# Patient Record
Sex: Male | Born: 1945
Health system: Southern US, Community
[De-identification: ages and names within clinical notes are randomized; demographics above are authoritative.]

## PROBLEM LIST (undated history)

## (undated) DIAGNOSIS — D649 Anemia, unspecified: Secondary | ICD-10-CM

## (undated) DIAGNOSIS — R569 Unspecified convulsions: Secondary | ICD-10-CM

## (undated) DIAGNOSIS — E785 Hyperlipidemia, unspecified: Secondary | ICD-10-CM

## (undated) HISTORY — DX: Anemia, unspecified: D64.9

## (undated) HISTORY — DX: Unspecified convulsions: R56.9

## (undated) HISTORY — DX: Hyperlipidemia, unspecified: E78.5

## (undated) HISTORY — PX: EYE SURGERY: SHX253

---

## 2001-05-29 ENCOUNTER — Ambulatory Visit (HOSPITAL_COMMUNITY): Admission: RE | Admit: 2001-05-29 | Discharge: 2001-05-29 | Payer: Self-pay | Admitting: Gastroenterology

## 2006-06-14 ENCOUNTER — Emergency Department (HOSPITAL_COMMUNITY): Admission: EM | Admit: 2006-06-14 | Discharge: 2006-06-14 | Payer: Self-pay | Admitting: Emergency Medicine

## 2006-08-18 IMAGING — CR DG FOOT COMPLETE 3+V*R*
3 series · 3 of 3 positions shown · non-contrast
Comparison: none

CLINICAL DATA: Swelling and right foot pain, no trauma.
 RIGHT FOOT - 3 VIEW:
 There is no evidence of fracture or dislocation. There is no evidence of arthropathy or other focal bone abnormality.  Soft tissues are unremarkable.

[t foot ap right]
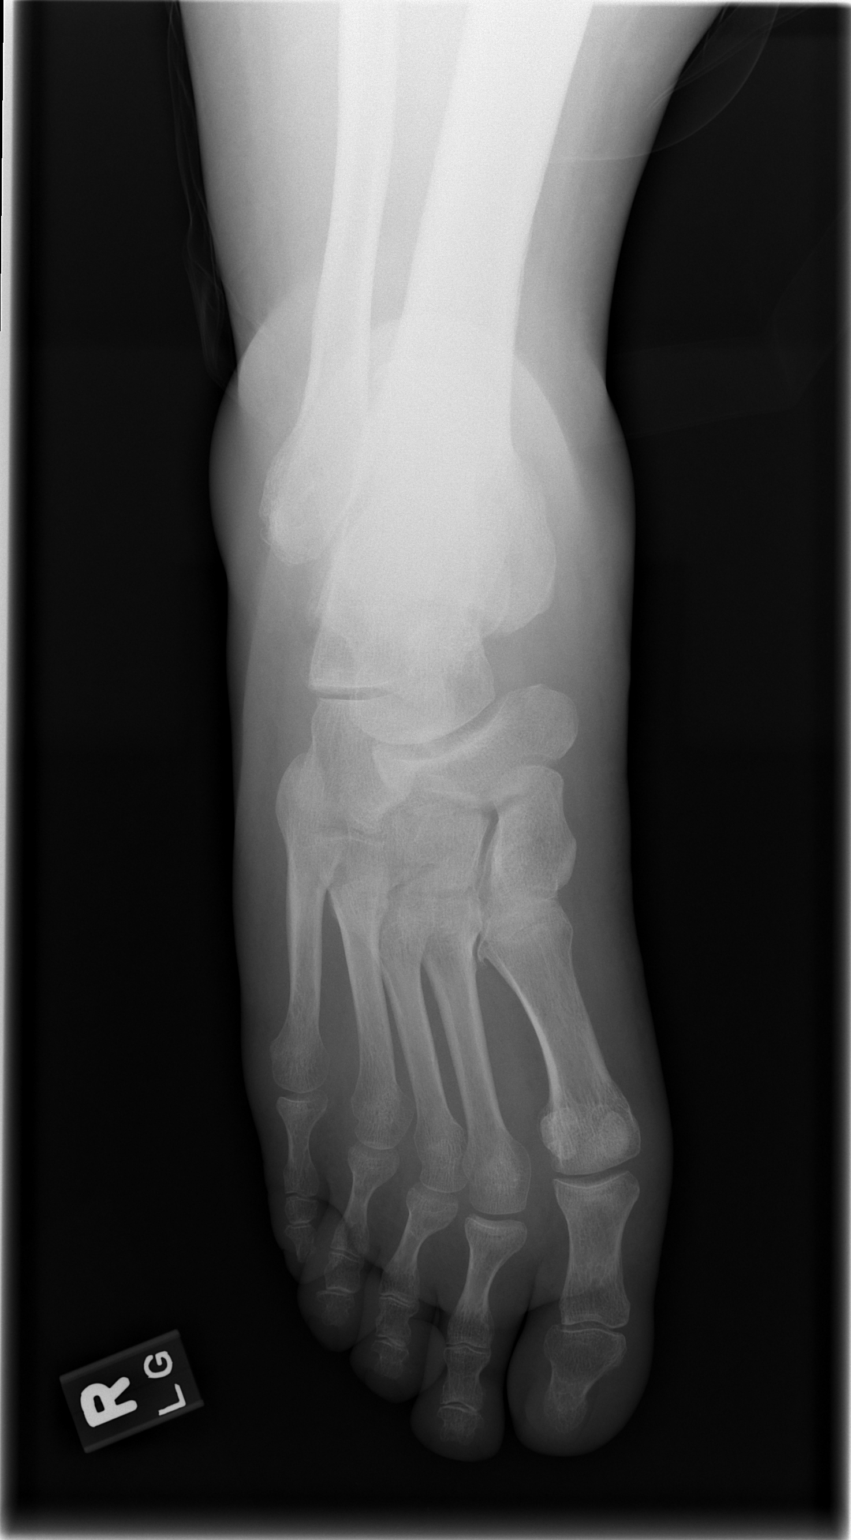

[t foot oblique right]
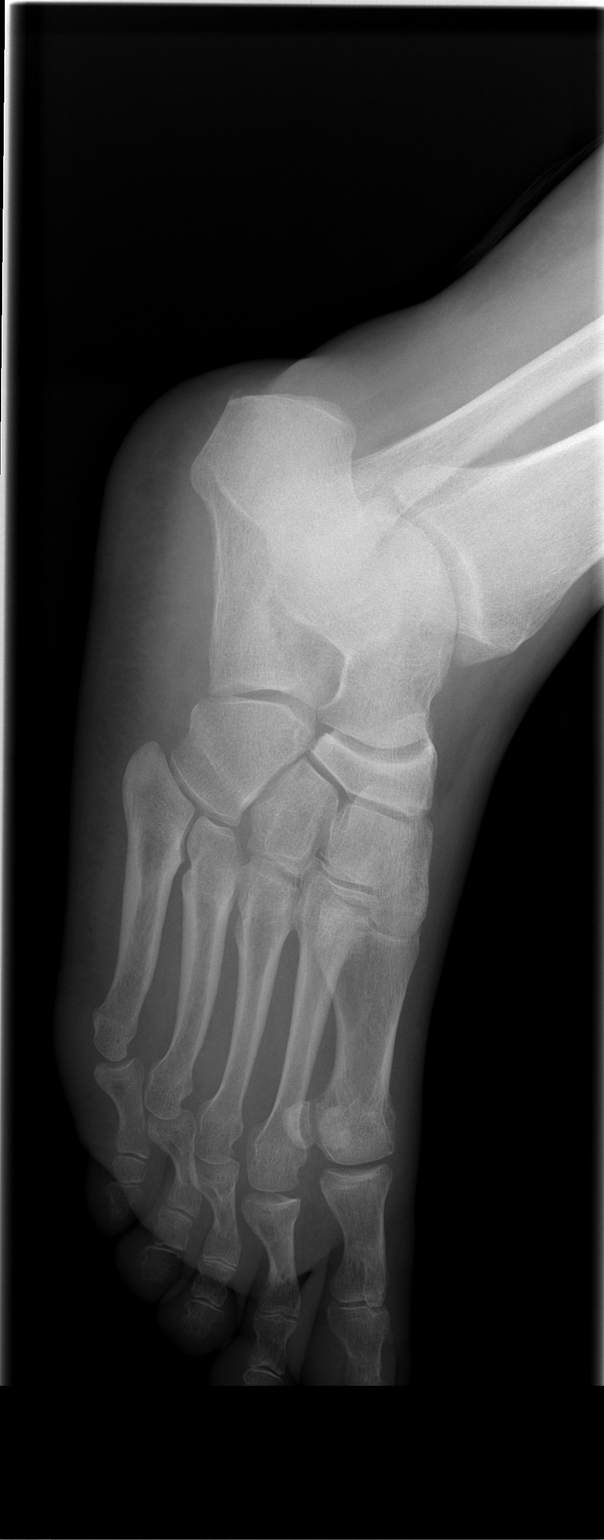

[t foot lat right]
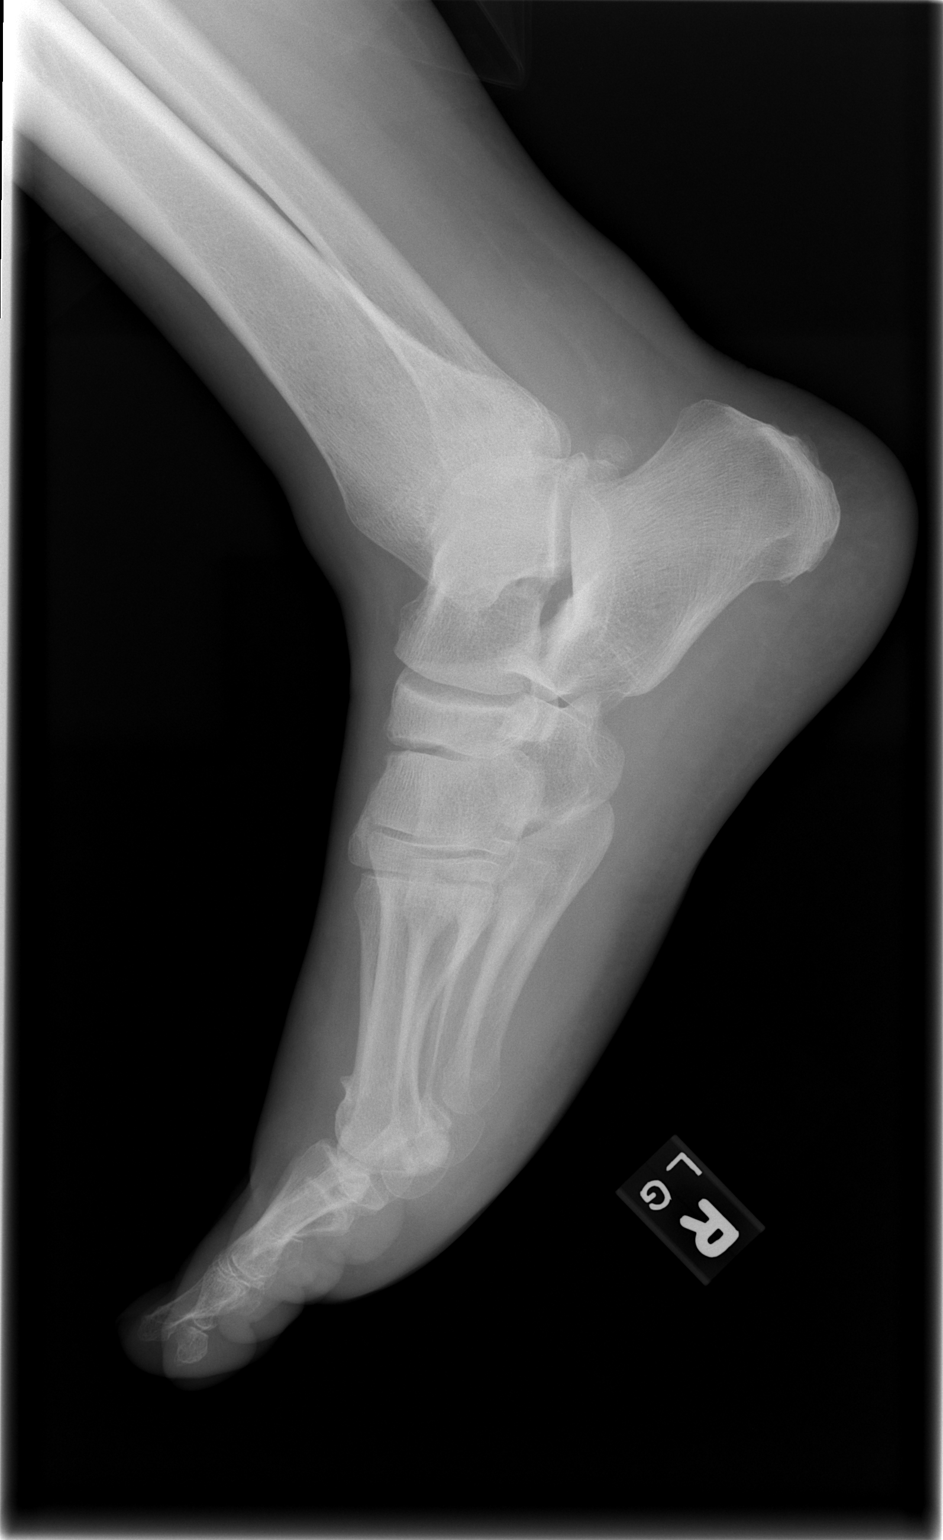

[3 of 3 positions shown; findings below may reference images not displayed]

IMPRESSION: Negative.

## 2013-04-26 ENCOUNTER — Ambulatory Visit (INDEPENDENT_AMBULATORY_CARE_PROVIDER_SITE_OTHER): Payer: Medicare Other | Admitting: Family Medicine

## 2013-04-26 VITALS — BP 127/74 | HR 78 | Temp 98.4°F | Resp 16 | Ht 67.5 in | Wt 191.2 lb

## 2013-04-26 DIAGNOSIS — H6120 Impacted cerumen, unspecified ear: Secondary | ICD-10-CM

## 2013-04-26 DIAGNOSIS — H6122 Impacted cerumen, left ear: Secondary | ICD-10-CM

## 2013-04-26 NOTE — Progress Notes (Signed)
Is a 67 year old man who comes in with his wife because of decreased hearing in the left ear. This began 2 weeks ago. Biweekly tried flushing of ear with only partial success. He's had no ear pain, dizziness, or fever.  Objective: Left ear canal shows a cerumen impaction. Left ear was irrigated copiously and cerumen was dislodged and removed  Assessment: Cerumen impaction Aref    plan: No further action necessary  Signed, Sheila Oats.D.

## 2020-04-15 ENCOUNTER — Encounter: Payer: Self-pay | Admitting: Radiology

## 2020-04-15 ENCOUNTER — Emergency Department: Payer: Medicare Other

## 2020-04-15 ENCOUNTER — Other Ambulatory Visit: Payer: Self-pay

## 2020-04-15 ENCOUNTER — Observation Stay
Admission: EM | Admit: 2020-04-15 | Discharge: 2020-04-16 | Disposition: A | Discharge status: Home / Self Care | Payer: Medicare Other | Attending: Internal Medicine | Admitting: Internal Medicine

## 2020-04-15 DIAGNOSIS — I700 Atherosclerosis of aorta: Secondary | ICD-10-CM | POA: Diagnosis not present

## 2020-04-15 DIAGNOSIS — I249 Acute ischemic heart disease, unspecified: Principal | ICD-10-CM | POA: Insufficient documentation

## 2020-04-15 DIAGNOSIS — G40909 Epilepsy, unspecified, not intractable, without status epilepticus: Secondary | ICD-10-CM | POA: Diagnosis not present

## 2020-04-15 DIAGNOSIS — Z79899 Other long term (current) drug therapy: Secondary | ICD-10-CM | POA: Diagnosis not present

## 2020-04-15 DIAGNOSIS — R7989 Other specified abnormal findings of blood chemistry: Secondary | ICD-10-CM

## 2020-04-15 DIAGNOSIS — R030 Elevated blood-pressure reading, without diagnosis of hypertension: Secondary | ICD-10-CM | POA: Diagnosis not present

## 2020-04-15 DIAGNOSIS — R0789 Other chest pain: Secondary | ICD-10-CM

## 2020-04-15 DIAGNOSIS — I4510 Unspecified right bundle-branch block: Secondary | ICD-10-CM | POA: Diagnosis not present

## 2020-04-15 DIAGNOSIS — R569 Unspecified convulsions: Secondary | ICD-10-CM

## 2020-04-15 DIAGNOSIS — Z20822 Contact with and (suspected) exposure to covid-19: Secondary | ICD-10-CM | POA: Insufficient documentation

## 2020-04-15 DIAGNOSIS — E785 Hyperlipidemia, unspecified: Secondary | ICD-10-CM | POA: Diagnosis not present

## 2020-04-15 DIAGNOSIS — R778 Other specified abnormalities of plasma proteins: Secondary | ICD-10-CM

## 2020-04-15 DIAGNOSIS — R079 Chest pain, unspecified: Secondary | ICD-10-CM | POA: Diagnosis present

## 2020-04-15 LAB — CBC
HCT: 38.6 % — ABNORMAL LOW (ref 39.0–52.0)
Hemoglobin: 12.9 g/dL — ABNORMAL LOW (ref 13.0–17.0)
MCH: 31.1 pg (ref 26.0–34.0)
MCHC: 33.4 g/dL (ref 30.0–36.0)
MCV: 93 fL (ref 80.0–100.0)
Platelets: 195 10*3/uL (ref 150–400)
RBC: 4.15 MIL/uL — ABNORMAL LOW (ref 4.22–5.81)
RDW: 12.8 % (ref 11.5–15.5)
WBC: 6.9 10*3/uL (ref 4.0–10.5)
nRBC: 0 % (ref 0.0–0.2)

## 2020-04-15 LAB — BASIC METABOLIC PANEL
Anion gap: 7 (ref 5–15)
BUN: 20 mg/dL (ref 8–23)
CO2: 26 mmol/L (ref 22–32)
Calcium: 9.2 mg/dL (ref 8.9–10.3)
Chloride: 107 mmol/L (ref 98–111)
Creatinine, Ser: 1.23 mg/dL (ref 0.61–1.24)
GFR calc Af Amer: >60 mL/min (ref >60)
GFR calc non Af Amer: 58 mL/min — ABNORMAL LOW (ref >60)
Glucose, Bld: 109 mg/dL — ABNORMAL HIGH (ref 70–99)
Potassium: 4.7 mmol/L (ref 3.5–5.1)
Sodium: 140 mmol/L (ref 135–145)

## 2020-04-15 LAB — TROPONIN I (HIGH SENSITIVITY)
Troponin I (High Sensitivity): 20 ng/L — ABNORMAL HIGH (ref <18)
Troponin I (High Sensitivity): 30 ng/L — ABNORMAL HIGH (ref <18)
Troponin I (High Sensitivity): 36 ng/L — ABNORMAL HIGH (ref <18)
Troponin I (High Sensitivity): 63 ng/L — ABNORMAL HIGH (ref <18)

## 2020-04-15 LAB — APTT: aPTT: 26 seconds (ref 24–36)

## 2020-04-15 LAB — SARS CORONAVIRUS 2 BY RT PCR (HOSPITAL ORDER, PERFORMED IN ~~LOC~~ HOSPITAL LAB): SARS Coronavirus 2: NEGATIVE

## 2020-04-15 LAB — PROTIME-INR
INR: 1 (ref 0.8–1.2)
Prothrombin Time: 13.2 seconds (ref 11.4–15.2)

## 2020-04-15 IMAGING — CT CT ANGIO CHEST
2 of 6 series · 18 of 46 positions shown · IV contrast (APPLIED)
Comparison: Film from earlier in the same day.

CLINICAL DATA: Shortness of breath

EXAM:
CT ANGIOGRAPHY CHEST WITH CONTRAST
TECHNIQUE: Multidetector CT imaging of the chest was performed using the
standard protocol during bolus administration of intravenous
contrast. Multiplanar CT image reconstructions and MIPs were
obtained to evaluate the vascular anatomy.
CONTRAST:  75mL OMNIPAQUE IOHEXOL 350 MG/ML SOLN

[Series 5: thins · axial · 0.66mm/px · z∈[-335,-80]mm · 15 of 281 slices shown]
[im 13/281  lung]
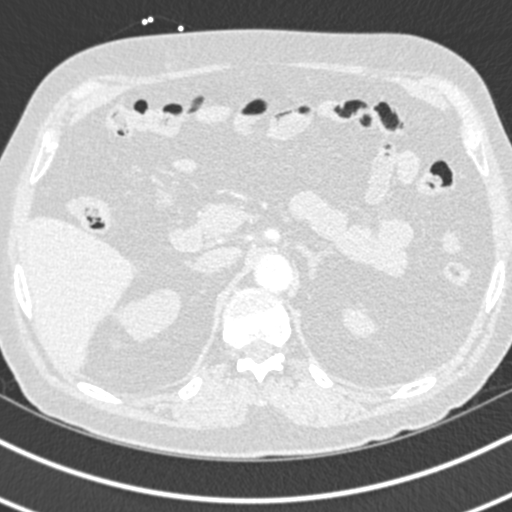
[im 37/281  soft-tissue]
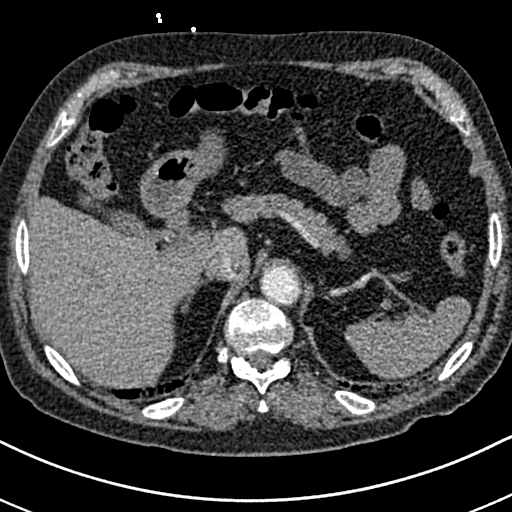
[im 49/281  lung]
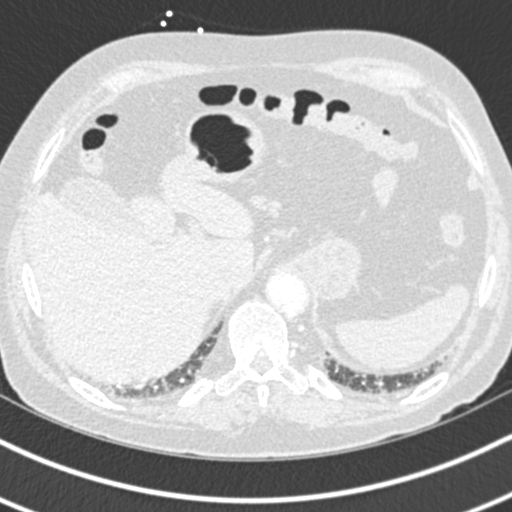
[im 74/281  soft-tissue]
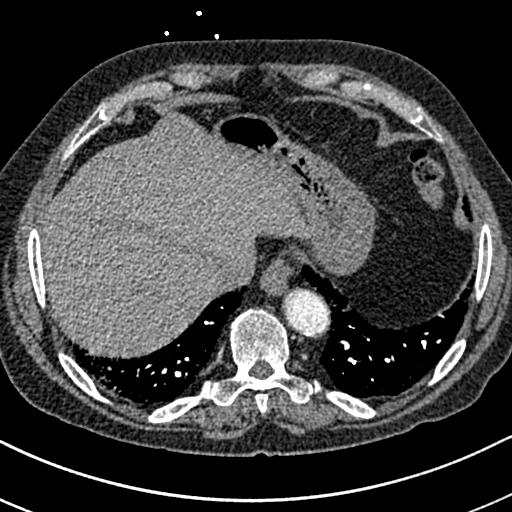
[im 86/281  lung]
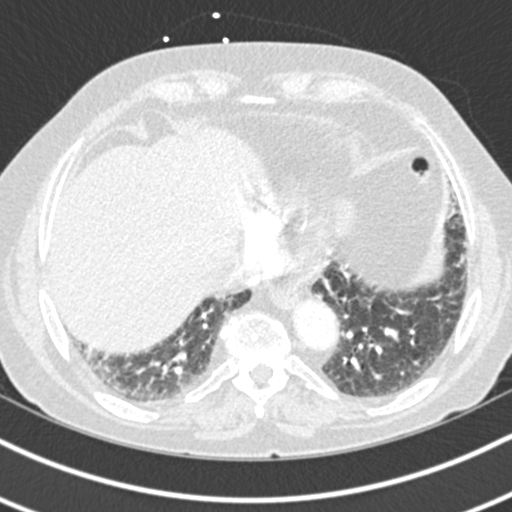
[im 110/281  soft-tissue]
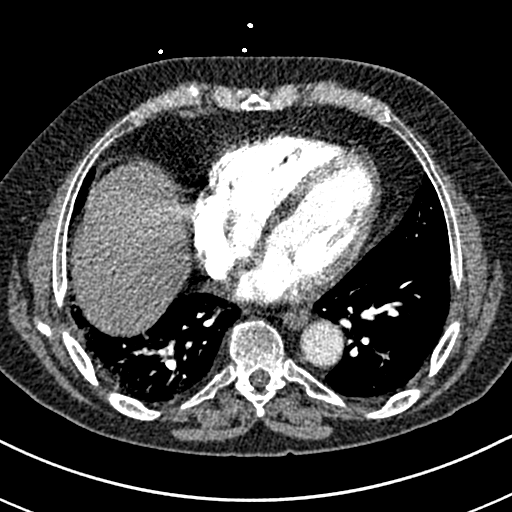
[im 122/281  lung]
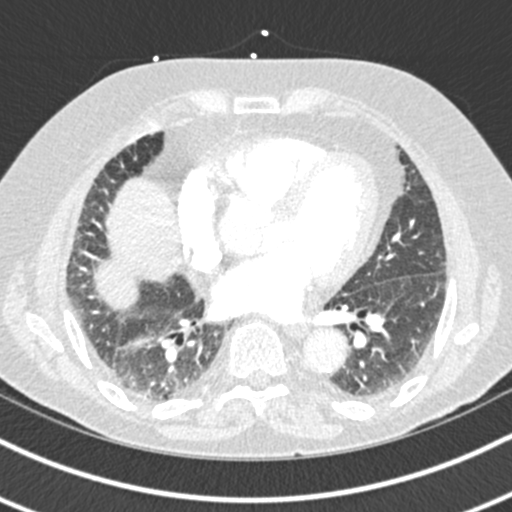
[im 147/281  soft-tissue]
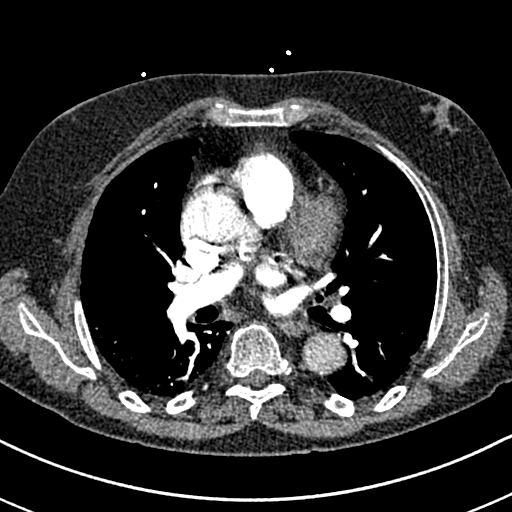
[im 159/281  lung]
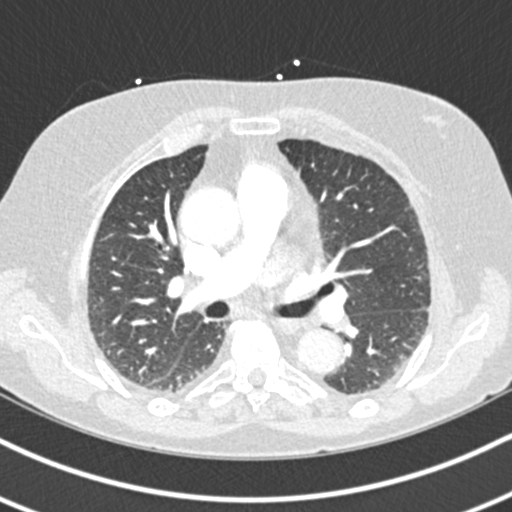
[im 171/281  soft-tissue]
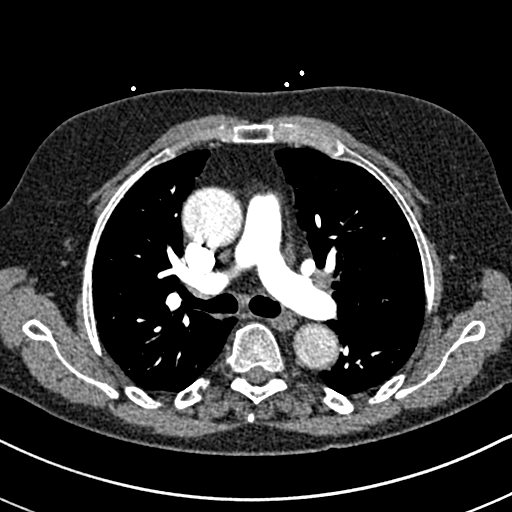
[im 195/281  lung]
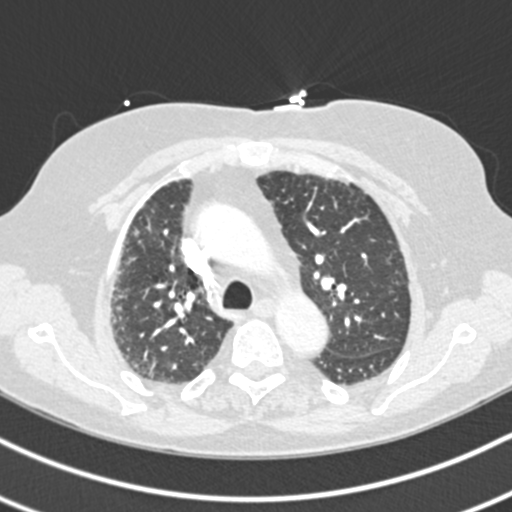
[im 207/281  soft-tissue]
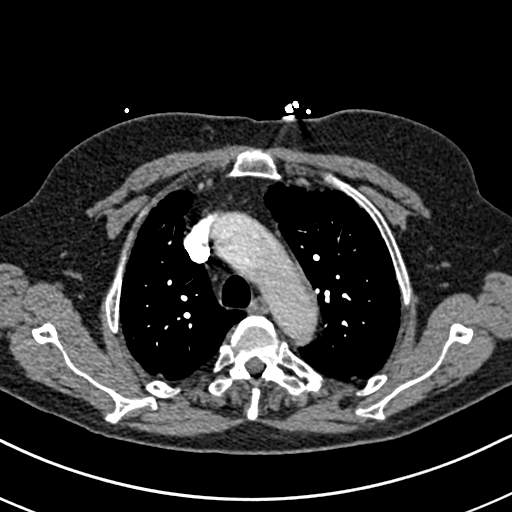
[im 232/281  lung]
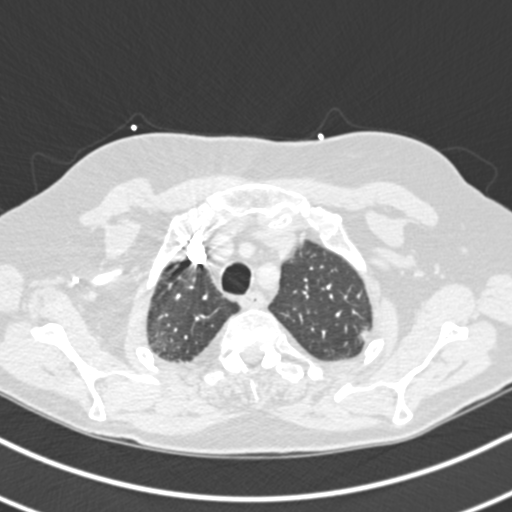
[im 244/281  soft-tissue]
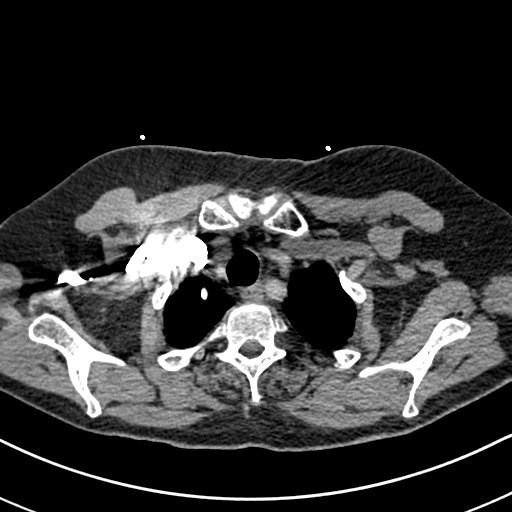
[im 268/281  lung]
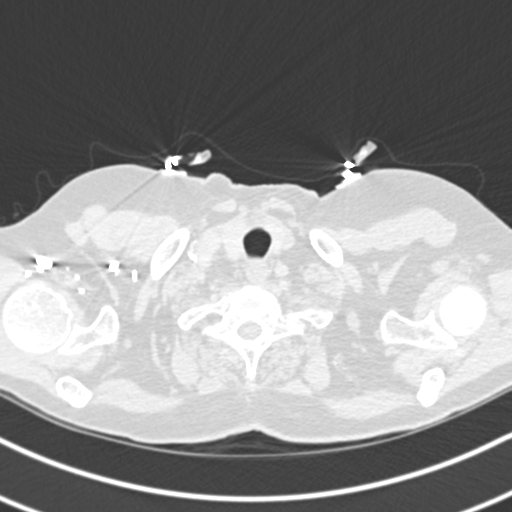

[Series 7: coronal mpr · coronal · 0.60mm/px · 3 of 89 slices shown]
[im 23/89  soft-tissue]
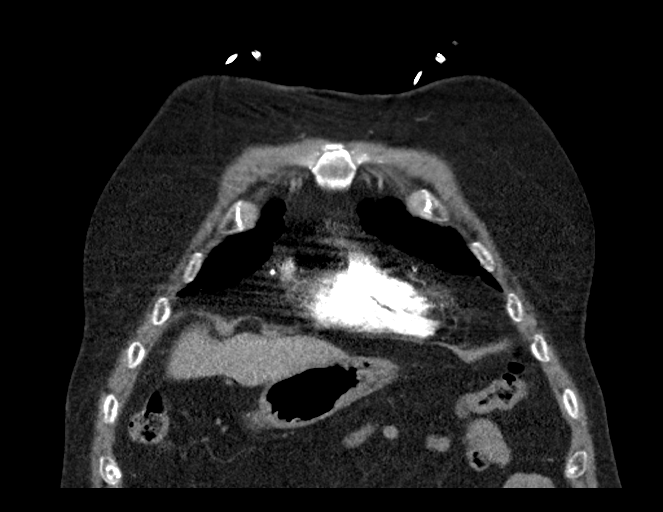
[im 45/89  soft-tissue]
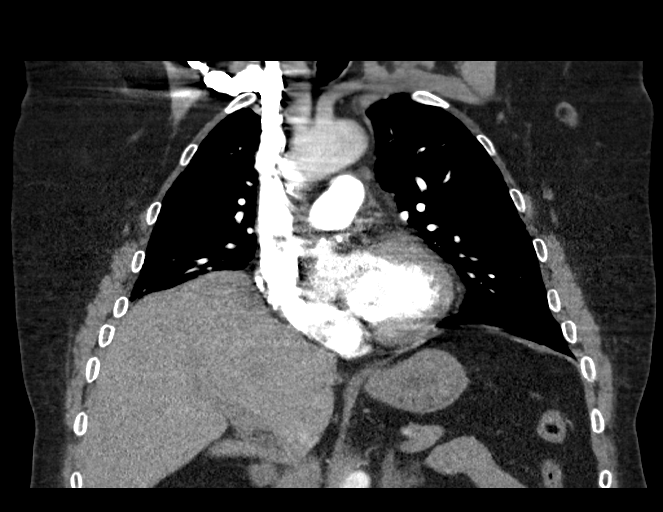
[im 67/89  soft-tissue]
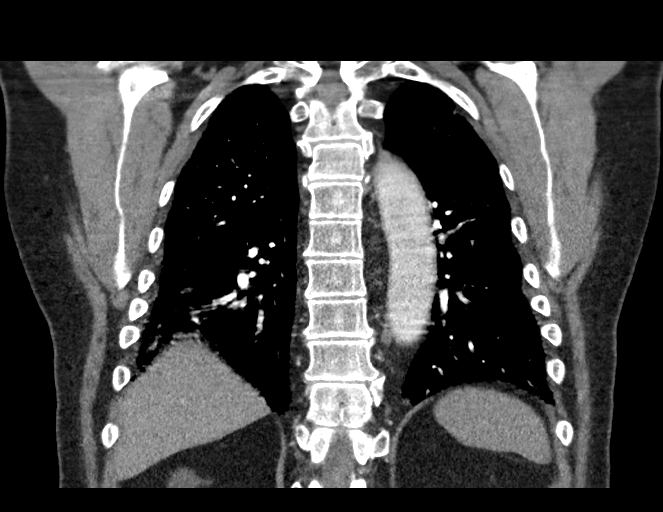

[18 of 46 positions shown; findings below may reference images not displayed]

FINDINGS: Cardiovascular: Atherosclerotic calcifications of the thoracic aorta
are noted. No aneurysmal dilatation or dissection is seen. No
cardiac enlargement is noted. Pulmonary artery shows a normal
branching pattern. No focal filling defect to suggest pulmonary
embolism is seen. Coronary calcifications are noted.

Mediastinum/Nodes: Thoracic inlet is within normal limits. No
sizable hilar or mediastinal adenopathy is noted. The esophagus is
within normal limits.

Lungs/Pleura: Mild dependent atelectatic changes are noted. No focal
infiltrate or sizable effusion is seen. No sizable parenchymal
nodules are noted.

Upper Abdomen: Visualized upper abdomen is within normal limits.

Musculoskeletal: Degenerative changes of the thoracic spine are
seen.

Review of the MIP images confirms the above findings.
IMPRESSION: No evidence of pulmonary emboli.

Mild dependent atelectatic changes are noted.

Aortic Atherosclerosis ([02]-[02]).

## 2020-04-15 IMAGING — CR DG CHEST 2V
2 series · 2 of 2 positions shown · non-contrast
Comparison: None.

CLINICAL DATA: Chest pain extending to left shoulder.

EXAM:
CHEST - 2 VIEW

[chest pa]
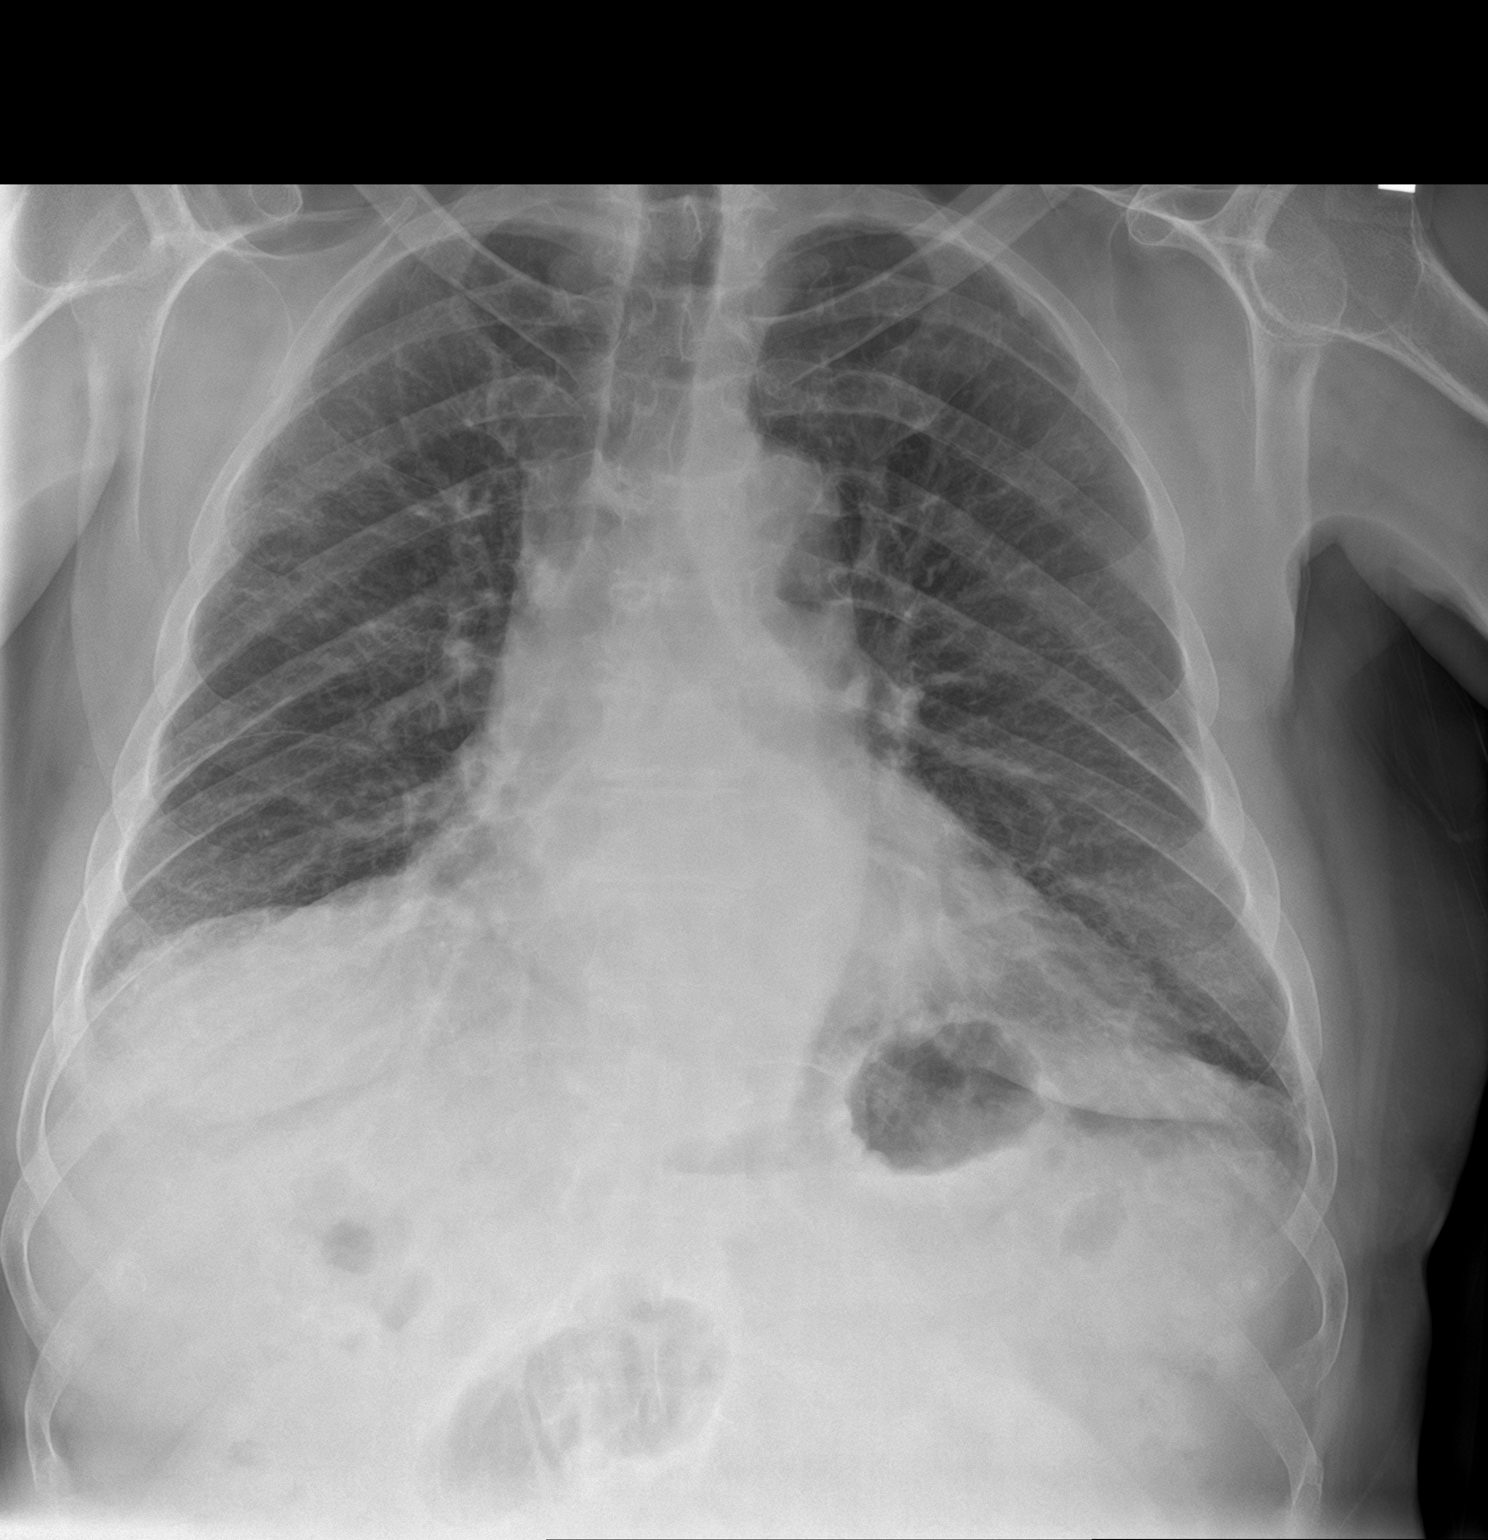

[chest lat]
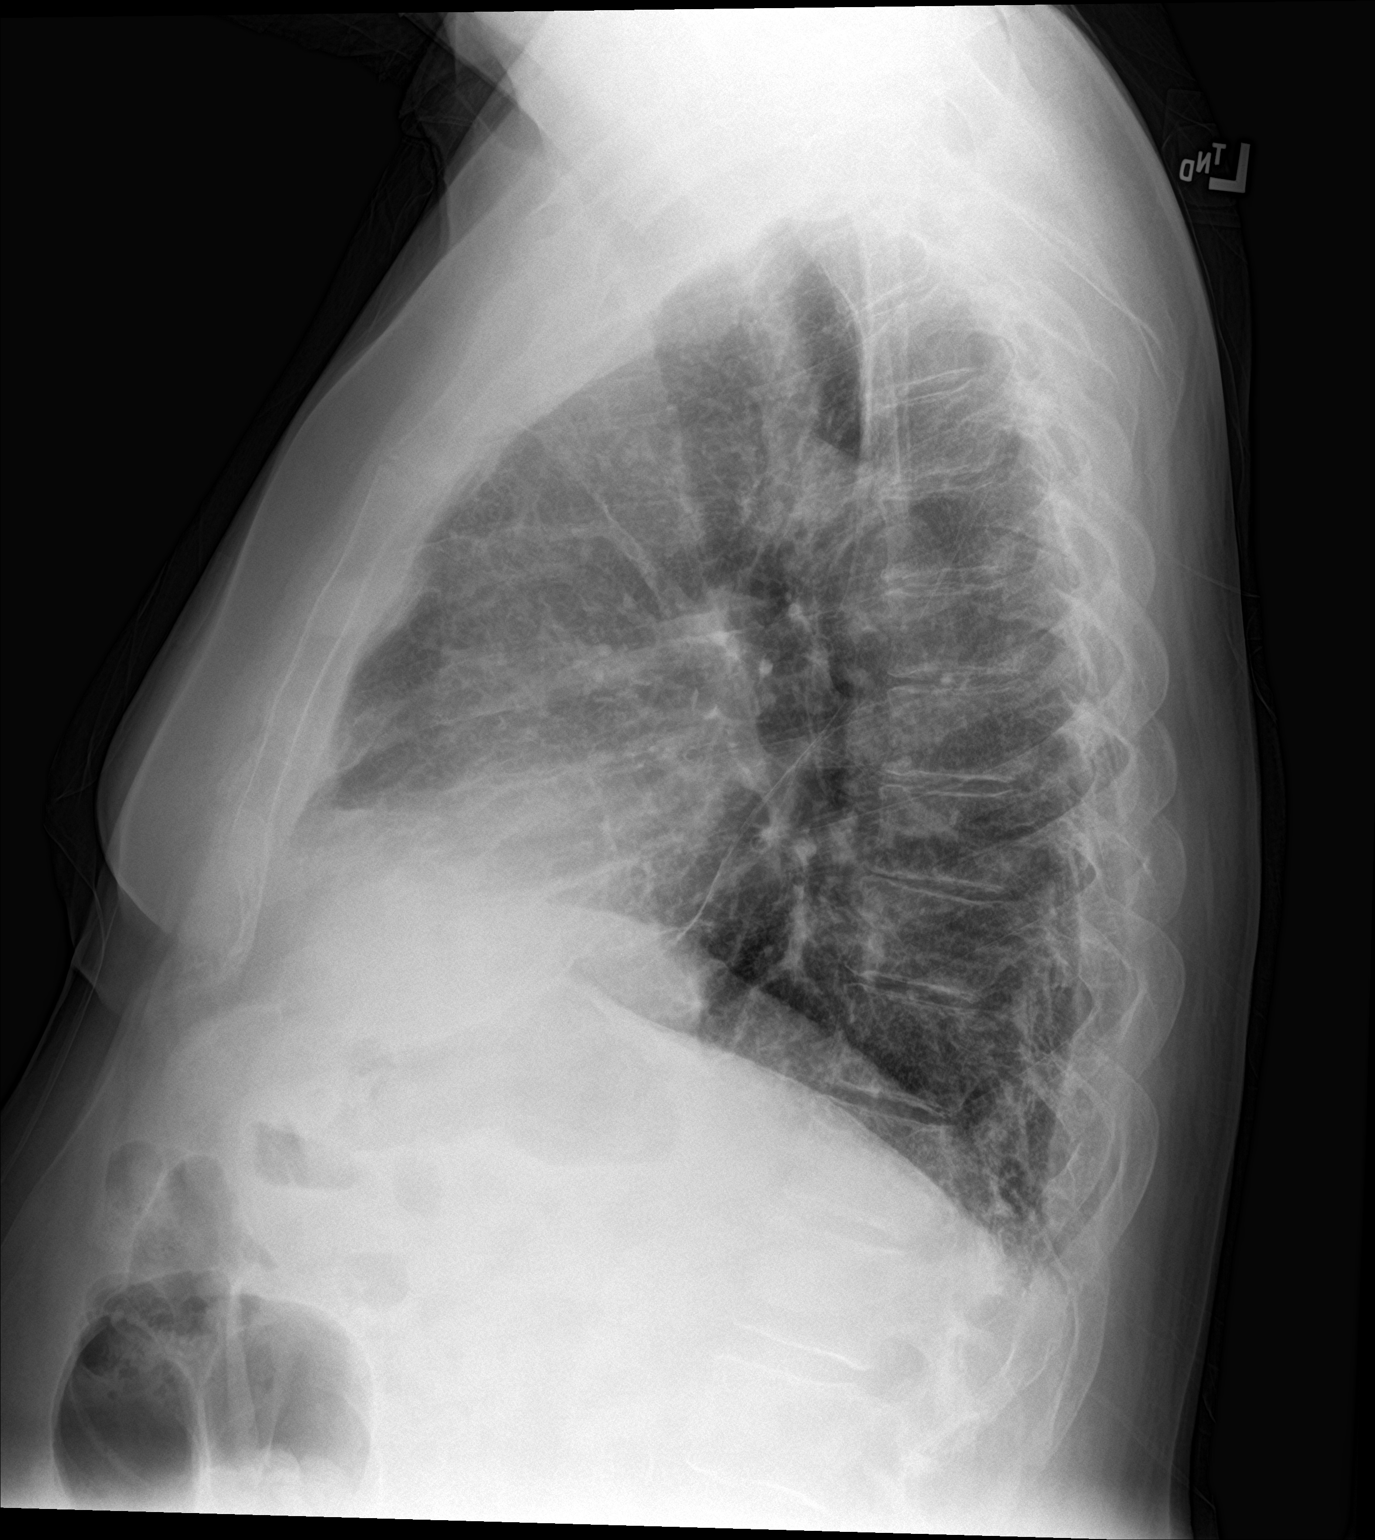

[2 of 2 positions shown; findings below may reference images not displayed]

FINDINGS: Heart size is exaggerated by low lung volumes. No edema or effusion
is present. Minimal airspace disease is noted posteriorly in the
lower lobes bilaterally, left greater than right. This likely
reflects atelectasis. Axial skeleton unremarkable.
IMPRESSION: 1. Minimal bibasilar airspace disease likely reflects atelectasis.
2. Low lung volumes.
3. No acute cardiopulmonary disease.

## 2020-04-15 MED ORDER — ALPRAZOLAM 0.25 MG PO TABS: 0.2500 mg | ORAL_TABLET | Freq: Two times a day (BID) | ORAL | Status: DC | PRN

## 2020-04-15 MED ORDER — ASPIRIN 325 MG PO TABS
325.0000 mg | ORAL_TABLET | Freq: Once | ORAL | Status: DC
  Filled 2020-04-15: qty 1

## 2020-04-15 MED ORDER — HEPARIN BOLUS VIA INFUSION
4000.0000 [IU] | Freq: Once | INTRAVENOUS | Status: AC
  Administered 2020-04-15: 4000 [IU] via INTRAVENOUS
  Filled 2020-04-15: qty 4000

## 2020-04-15 MED ORDER — ATORVASTATIN CALCIUM 10 MG PO TABS
10.0000 mg | ORAL_TABLET | Freq: Every day | ORAL | Status: DC
  Administered 2020-04-15 – 2020-04-16 (×2): 10 mg via ORAL
  Filled 2020-04-15 (×2): qty 1

## 2020-04-15 MED ORDER — ASPIRIN 81 MG PO CHEW
324.0000 mg | CHEWABLE_TABLET | Freq: Once | ORAL | Status: AC
  Administered 2020-04-15: 324 mg via ORAL

## 2020-04-15 MED ORDER — ASPIRIN EC 81 MG PO TBEC
81.0000 mg | DELAYED_RELEASE_TABLET | Freq: Every day | ORAL | Status: DC
  Administered 2020-04-16: 81 mg via ORAL
  Filled 2020-04-15: qty 1

## 2020-04-15 MED ORDER — SODIUM CHLORIDE 0.9% FLUSH: 3.0000 mL | Freq: Once | INTRAVENOUS | Status: DC

## 2020-04-15 MED ORDER — NITROGLYCERIN 0.4 MG SL SUBL: 0.4000 mg | SUBLINGUAL_TABLET | SUBLINGUAL | Status: DC | PRN

## 2020-04-15 MED ORDER — ACETAMINOPHEN 325 MG PO TABS: 650.0000 mg | ORAL_TABLET | ORAL | Status: DC | PRN

## 2020-04-15 MED ORDER — HEPARIN (PORCINE) 25000 UT/250ML-% IV SOLN
1000.0000 [IU]/h | INTRAVENOUS | Status: DC
  Administered 2020-04-15: 1200 [IU]/h via INTRAVENOUS
  Filled 2020-04-15: qty 250

## 2020-04-15 MED ORDER — ONDANSETRON HCL 4 MG/2ML IJ SOLN: 4.0000 mg | Freq: Four times a day (QID) | INTRAMUSCULAR | Status: DC | PRN

## 2020-04-15 MED ORDER — IOHEXOL 350 MG/ML SOLN
75.0000 mL | Freq: Once | INTRAVENOUS | Status: AC | PRN
  Administered 2020-04-15: 75 mL via INTRAVENOUS

## 2020-04-15 MED ORDER — METOPROLOL TARTRATE 25 MG PO TABS
25.0000 mg | ORAL_TABLET | Freq: Two times a day (BID) | ORAL | Status: DC
  Administered 2020-04-15 – 2020-04-16 (×2): 25 mg via ORAL
  Filled 2020-04-15 (×2): qty 1

## 2020-04-15 MED ORDER — MORPHINE SULFATE (PF) 2 MG/ML IV SOLN: 2.0000 mg | INTRAVENOUS | Status: DC | PRN

## 2020-04-15 NOTE — ED Triage Notes (Addendum)
Pt arrives via POV for chest pain that started last night around 9pm. Pt reports he woke up from a nap earlier today and his chest was hurting with pain radiating to the left shoulder. Pt denies SHOB, dizziness or nausea. Speaking in compete sentences without SHOB. NAD noted. Pt seen at [facility] and was told to come to the ER for blood work

## 2020-04-15 NOTE — H&P (Signed)
History and Physical    Edgel Degnan Phimmasone DPO:[id number] DOB: 03-08-1946 DOA: 04/15/2020  PCP: Leeroy Cha, MD   Patient coming from: Home  I have personally briefly reviewed patient's old medical records in [place]  Chief Complaint: Chest pain  HPI: Mitchell Barnes is a 74 y.o. male with significant past medical history who presents to the emergency room with a 1 day history of retrosternal mild to moderate intensity chest pain radiating to the left arm, described as tightness, it is nonexertional and has been constant since onset.  He has no associated nausea, vomiting, diaphoresis, shortness of breath, lightheadedness or palpitations.  No aggravating or alleviating factors ED Course: On arrival, vitals were within normal limits.  EKG showed a right bundle branch block with otherwise nonspecific ST-T wave changes and no priors for comparison.  Troponin trended up 20>>30>>36.  He was administered aspirin 325 with complete relief of chest pain.  CTA chest was negative for acute PE.  hospitalist consulted for admission.  Review of Systems: As per HPI otherwise 10 point review of systems negative.    Past Medical History:  Diagnosis Date  . Seizures ([place])     No past surgical history on file.   reports that he has never smoked. He does not have any smokeless tobacco history on file. He reports current alcohol use. He reports that he does not use drugs.  Allergies  Allergen Reactions  . Ritalin [Methylphenidate Hcl] Rash    No family history on file.   Prior to Admission medications   Not on File    Physical Exam: Vitals:   04/15/20 1807 04/15/20 1930 04/15/20 2000 04/15/20 2030  BP: (!) 143/78 (!) 153/92 (!) 163/79 (!) 146/85  Pulse: 69 64 69 62  Resp: 18 16 14 12   Temp:      TempSrc:      SpO2: 96% 97% 95% 93%  Weight:      Height:         Vitals:   04/15/20 1807 04/15/20 1930 04/15/20 2000 04/15/20 2030  BP: (!) 143/78 (!) 153/92  (!) 163/79 (!) 146/85  Pulse: 69 64 69 62  Resp: 18 16 14 12   Temp:      TempSrc:      SpO2: 96% 97% 95% 93%  Weight:      Height:        Constitutional: Alert and awake, oriented x3, not in any acute distress. Eyes: PERLA, EOMI, irises appear normal, anicteric sclera,  ENMT: external ears and nose appear normal, normal hearing             Lips appears normal, oropharynx mucosa, tongue, posterior pharynx appear normal  Neck: neck appears normal, no masses, normal ROM, no thyromegaly, no JVD  CVS: S1-S2 clear, no murmur rubs or gallops,  , no carotid bruits, pedal pulses palpable, No LE edema Respiratory:  clear to auscultation bilaterally, no wheezing, rales or rhonchi. Respiratory effort normal. No accessory muscle use.  Abdomen: soft nontender, nondistended, normal bowel sounds, no hepatosplenomegaly, no hernias Musculoskeletal: : no cyanosis, clubbing , no contractures or atrophy Neuro: Cranial nerves II-XII intact, sensation, reflexes normal, strength Psych: judgement and insight appear normal, stable mood and affect,  Skin: no rashes or lesions or ulcers, no induration or nodules   Labs on Admission: I have personally reviewed following labs and imaging studies  CBC: Recent Labs  Lab 04/15/20 1615  WBC 6.9  HGB 12.9*  HCT 38.6*  MCV 93.0  PLT 195   Basic Metabolic Panel: Recent Labs  Lab 04/15/20 1615  NA 140  K 4.7  CL 107  CO2 26  GLUCOSE 109*  BUN 20  CREATININE 1.23  CALCIUM 9.2   GFR: Estimated Creatinine Clearance: 58 mL/min (by C-G formula based on SCr of 1.23 mg/dL). Liver Function Tests: No results for input(s): AST, ALT, ALKPHOS, BILITOT, PROT, ALBUMIN in the last 168 hours. No results for input(s): LIPASE, AMYLASE in the last 168 hours. No results for input(s): AMMONIA in the last 168 hours. Coagulation Profile: No results for input(s): INR, PROTIME in the last 168 hours. Cardiac Enzymes: No results for input(s): CKTOTAL, CKMB, CKMBINDEX,  TROPONINI in the last 168 hours. BNP (last 3 results) No results for input(s): PROBNP in the last 8760 hours. HbA1C: No results for input(s): HGBA1C in the last 72 hours. CBG: No results for input(s): GLUCAP in the last 168 hours. Lipid Profile: No results for input(s): CHOL, HDL, LDLCALC, TRIG, CHOLHDL, LDLDIRECT in the last 72 hours. Thyroid Function Tests: No results for input(s): TSH, T4TOTAL, FREET4, T3FREE, THYROIDAB in the last 72 hours. Anemia Panel: No results for input(s): VITAMINB12, FOLATE, FERRITIN, TIBC, IRON, RETICCTPCT in the last 72 hours. Urine analysis: No results found for: COLORURINE, APPEARANCEUR, LABSPEC, PHURINE, GLUCOSEU, HGBUR, BILIRUBINUR, KETONESUR, PROTEINUR, UROBILINOGEN, NITRITE, LEUKOCYTESUR  Radiological Exams on Admission: DG Chest 2 View  Result Date: 04/15/2020 CLINICAL DATA:  Chest pain extending to left shoulder. EXAM: CHEST - 2 VIEW COMPARISON:  None. FINDINGS: Heart size is exaggerated by low lung volumes. No edema or effusion is present. Minimal airspace disease is noted posteriorly in the lower lobes bilaterally, left greater than right. This likely reflects atelectasis. Axial skeleton unremarkable. IMPRESSION: 1. Minimal bibasilar airspace disease likely reflects atelectasis. 2. Low lung volumes. 3. No acute cardiopulmonary disease. Electronically Signed   By: Marin Roberts M.D.   On: 04/15/2020 17:40   CT Angio Chest PE W and/or Wo Contrast  Result Date: 04/15/2020 CLINICAL DATA:  Shortness of breath EXAM: CT ANGIOGRAPHY CHEST WITH CONTRAST TECHNIQUE: Multidetector CT imaging of the chest was performed using the standard protocol during bolus administration of intravenous contrast. Multiplanar CT image reconstructions and MIPs were obtained to evaluate the vascular anatomy. CONTRAST:  59mL OMNIPAQUE IOHEXOL 350 MG/ML SOLN COMPARISON:  Film from earlier in the same day. FINDINGS: Cardiovascular: Atherosclerotic calcifications of the thoracic  aorta are noted. No aneurysmal dilatation or dissection is seen. No cardiac enlargement is noted. Pulmonary artery shows a normal branching pattern. No focal filling defect to suggest pulmonary embolism is seen. Coronary calcifications are noted. Mediastinum/Nodes: Thoracic inlet is within normal limits. No sizable hilar or mediastinal adenopathy is noted. The esophagus is within normal limits. Lungs/Pleura: Mild dependent atelectatic changes are noted. No focal infiltrate or sizable effusion is seen. No sizable parenchymal nodules are noted. Upper Abdomen: Visualized upper abdomen is within normal limits. Musculoskeletal: Degenerative changes of the thoracic spine are seen. Review of the MIP images confirms the above findings. IMPRESSION: No evidence of pulmonary emboli. Mild dependent atelectatic changes are noted. Aortic Atherosclerosis (ICD10-I70.0). Electronically Signed   By: Alcide Clever M.D.   On: 04/15/2020 20:09    EKG: Independently reviewed.   Assessment/Plan Active Problems:  Suspect acute coronary syndrome  -Patient presenting with retrosternal chest pain radiating to left arm with both typical and atypical features with troponin showing mild upward trend 20>>30>>36.  EKG with nonspecific ST-T wave changes -CTA chest ruled out PE as etiology -Relieved with aspirin  in the emergency room -Aspirin, nitroglycerin sublingual as needed chest pain with morphine for breakthrough.  Statin and beta-blocker . -Continue to trend troponin to peak.  Serial EKGs -Started on heparin infusion -Consulted cardiology, dr Elease Hashimoto -Keep n.p.o. for possible stress test in the a.m.  Echocardiogram ordered as well  DVT prophylaxis: Heparin infusion Code Status: full code  Family Communication:  none  Disposition Plan: Back to previous home environment Consults called: Cardiology Status:obs    Andris Baumann MD [facility]     04/15/2020, 9:24 PM

## 2020-04-15 NOTE — ED Notes (Signed)
Pt to desk, c/o worsening CP. Repeat EKG obtained by Marylene Land, RN.

## 2020-04-15 NOTE — ED Provider Notes (Signed)
[facility] EMERGENCY DEPARTMENT Provider Note   CSN: [id number] Arrival date & time: 04/15/20  1537     History Chief Complaint  Patient presents with  . Chest Pain    Mitchell Barnes is a 74 y.o. male hx of seizures, HL, here presenting with chest pain.  Patient states that he has some left-sided chest pain and also right-sided chest pain radiate down the left arm since yesterday evening. Patient states that her pain is at rest.  Not worse with exertion.  However the pain is constant.  Patient denies any recent travel or leg swelling.  Patient states that he has no known coronary artery disease.  Denies smoking .  Denies any abdominal pain or back pain.  The history is provided by the patient.       Past Medical History:  Diagnosis Date  . Seizures ([place])     There are no problems to display for this patient.   No past surgical history on file.     No family history on file.  Social History   Tobacco Use  . Smoking status: Never Smoker  Substance Use Topics  . Alcohol use: Yes  . Drug use: No    Home Medications Prior to Admission medications   Not on File    Allergies    Ritalin [methylphenidate hcl]  Review of Systems   Review of Systems  Cardiovascular: Positive for chest pain.  All other systems reviewed and are negative.   Physical Exam Updated Vital Signs BP (!) 146/85   Pulse 62   Temp 98.7 F (37.1 C) (Oral)   Resp 12   Ht 5\' 8"  (1.727 m)   Wt 88.9 kg   SpO2 93%   BMI 29.80 kg/m   Physical Exam Vitals and nursing note reviewed.  Constitutional:      Comments: Slightly uncomfortable   HENT:     Head: Normocephalic.  Eyes:     Extraocular Movements: Extraocular movements intact.     Pupils: Pupils are equal, round, and reactive to light.  Cardiovascular:     Rate and Rhythm: Normal rate and regular rhythm.     Heart sounds: Normal heart sounds.  Pulmonary:     Effort: Pulmonary effort is normal.    Breath sounds: Normal breath sounds.  Abdominal:     General: Bowel sounds are normal.     Palpations: Abdomen is soft.  Musculoskeletal:        General: Normal range of motion.     Cervical back: Normal range of motion and neck supple.  Skin:    General: Skin is warm.     Capillary Refill: Capillary refill takes less than 2 seconds.  Neurological:     General: No focal deficit present.     Mental Status: He is alert and oriented to person, place, and time.  Psychiatric:        Mood and Affect: Mood normal.        Behavior: Behavior normal.     ED Results / Procedures / Treatments   Labs (all labs ordered are listed, but only abnormal results are displayed) Labs Reviewed  BASIC METABOLIC PANEL - Abnormal; Notable for the following components:      Result Value   Glucose, Bld 109 (*)    GFR calc non Af Amer 58 (*)    All other components within normal limits  CBC - Abnormal; Notable for the following components:   RBC 4.15 (*)  Hemoglobin 12.9 (*)    HCT 38.6 (*)    All other components within normal limits  TROPONIN I (HIGH SENSITIVITY) - Abnormal; Notable for the following components:   Troponin I (High Sensitivity) 20 (*)    All other components within normal limits  TROPONIN I (HIGH SENSITIVITY) - Abnormal; Notable for the following components:   Troponin I (High Sensitivity) 30 (*)    All other components within normal limits  TROPONIN I (HIGH SENSITIVITY) - Abnormal; Notable for the following components:   Troponin I (High Sensitivity) 36 (*)    All other components within normal limits    EKG EKG Interpretation  Date/Time:  Friday Apr 15 2020 18:49:27 EDT Ventricular Rate:  68 PR Interval:  154 QRS Duration: 128 QT Interval:  432 QTC Calculation: 459 R Axis:   -43 Text Interpretation: Normal sinus rhythm Left axis deviation Right bundle branch block Minimal voltage criteria for LVH, may be normal variant ( R in aVL ) Abnormal ECG No significant change  since last tracing since earlier in the day Confirmed by Richardean Canal [phone]) on 04/15/2020 7:08:25 PM   Radiology DG Chest 2 View  Result Date: 04/15/2020 CLINICAL DATA:  Chest pain extending to left shoulder. EXAM: CHEST - 2 VIEW COMPARISON:  None. FINDINGS: Heart size is exaggerated by low lung volumes. No edema or effusion is present. Minimal airspace disease is noted posteriorly in the lower lobes bilaterally, left greater than right. This likely reflects atelectasis. Axial skeleton unremarkable. IMPRESSION: 1. Minimal bibasilar airspace disease likely reflects atelectasis. 2. Low lung volumes. 3. No acute cardiopulmonary disease. Electronically Signed   By: Marin Roberts M.D.   On: 04/15/2020 17:40   CT Angio Chest PE W and/or Wo Contrast  Result Date: 04/15/2020 CLINICAL DATA:  Shortness of breath EXAM: CT ANGIOGRAPHY CHEST WITH CONTRAST TECHNIQUE: Multidetector CT imaging of the chest was performed using the standard protocol during bolus administration of intravenous contrast. Multiplanar CT image reconstructions and MIPs were obtained to evaluate the vascular anatomy. CONTRAST:  58mL OMNIPAQUE IOHEXOL 350 MG/ML SOLN COMPARISON:  Film from earlier in the same day. FINDINGS: Cardiovascular: Atherosclerotic calcifications of the thoracic aorta are noted. No aneurysmal dilatation or dissection is seen. No cardiac enlargement is noted. Pulmonary artery shows a normal branching pattern. No focal filling defect to suggest pulmonary embolism is seen. Coronary calcifications are noted. Mediastinum/Nodes: Thoracic inlet is within normal limits. No sizable hilar or mediastinal adenopathy is noted. The esophagus is within normal limits. Lungs/Pleura: Mild dependent atelectatic changes are noted. No focal infiltrate or sizable effusion is seen. No sizable parenchymal nodules are noted. Upper Abdomen: Visualized upper abdomen is within normal limits. Musculoskeletal: Degenerative changes of the thoracic  spine are seen. Review of the MIP images confirms the above findings. IMPRESSION: No evidence of pulmonary emboli. Mild dependent atelectatic changes are noted. Aortic Atherosclerosis (ICD10-I70.0). Electronically Signed   By: Alcide Clever M.D.   On: 04/15/2020 20:09    Procedures Procedures (including critical care time)  Medications Ordered in ED Medications  sodium chloride flush (NS) 0.9 % injection 3 mL ( Intravenous Canceled Entry 04/15/20 1950)  iohexol (OMNIPAQUE) 350 MG/ML injection 75 mL (75 mLs Intravenous Contrast Given 04/15/20 1942)  aspirin chewable tablet 324 mg (324 mg Oral Given 04/15/20 1957)    ED Course  I have reviewed the triage vital signs and the nursing notes.  Pertinent labs & imaging results that were available during my care of the patient were reviewed by  me and considered in my medical decision making (see chart for details).    MDM Rules/Calculators/A&P                      Mitchell Barnes is a 74 y.o. male who presented with chest pain radiating down left arm.  Pain is constant since yesterday.  Some pleuritic component to it as well.  Concern for possible ACS or NSTEMI.  Also consider PE as well.  Will get troponin x2, CTA chest.  Will give aspirin and reassess.  8:41 PM CTA chest showed no PE.  His pain improved with aspirin.  His troponin is slowly rising from 20 to 30 now to 36.  Given that he has risk factors for ACS, and his troponin is slowly going up, will admit for ACS rule out.  Hospitalist to admit.  Final Clinical Impression(s) / ED Diagnoses Final diagnoses:  None    Rx / DC Orders ED Discharge Orders    None       Charlynne Pander, MD 04/15/20 2042

## 2020-04-15 NOTE — Progress Notes (Signed)
ANTICOAGULATION CONSULT NOTE - Initial Consult  Pharmacy Consult for Heparin Indication: chest pain/ACS  Allergies  Allergen Reactions  . Ritalin [Methylphenidate Hcl] Rash    Patient Measurements: Height: 5\' 8"  (172.7 cm) Weight: 88.9 kg (196 lb) IBW/kg (Calculated) : 68.4 HEPARIN DW (KG): 86.5  Vital Signs: Temp: 98.7 F (37.1 C) (05/14 1608) Temp Source: Oral (05/14 1608) BP: 146/85 (05/14 2030) Pulse Rate: 62 (05/14 2030)  Labs: Recent Labs    04/15/20 1615 04/15/20 1807 04/15/20 1959  HGB 12.9*  --   --   HCT 38.6*  --   --   PLT 195  --   --   CREATININE 1.23  --   --   TROPONINIHS 20* 30* 36*   Estimated Creatinine Clearance: 58 mL/min (by C-G formula based on SCr of 1.23 mg/dL).  Medical History: Past Medical History:  Diagnosis Date  . Seizures ([facility])    Medications:  (Not in a hospital admission)  Assessment: Asked to initiate Heparin for ACS.  Baseline labs ordered.  No anticoagulants PTA per current med list.  Goal of Therapy:  Heparin level 0.3-0.7 units/ml Monitor platelets by anticoagulation protocol: Yes   Plan:  Heparin bolus 4000 units x 1 now then infusion at 1200 units/hr Check HL ~ 8 hours after heparin initiated.  04/17/20 A 04/15/2020,9:53 PM

## 2020-04-16 ENCOUNTER — Other Ambulatory Visit: Payer: Self-pay

## 2020-04-16 ENCOUNTER — Observation Stay (HOSPITAL_BASED_OUTPATIENT_CLINIC_OR_DEPARTMENT_OTHER)
Admit: 2020-04-16 | Discharge: 2020-04-16 | Disposition: A | Discharge status: Home / Self Care | Payer: Medicare Other | Attending: Internal Medicine | Admitting: Internal Medicine

## 2020-04-16 ENCOUNTER — Observation Stay (HOSPITAL_BASED_OUTPATIENT_CLINIC_OR_DEPARTMENT_OTHER): Payer: Medicare Other

## 2020-04-16 DIAGNOSIS — R079 Chest pain, unspecified: Secondary | ICD-10-CM

## 2020-04-16 DIAGNOSIS — R569 Unspecified convulsions: Secondary | ICD-10-CM | POA: Diagnosis not present

## 2020-04-16 DIAGNOSIS — I10 Essential (primary) hypertension: Secondary | ICD-10-CM

## 2020-04-16 DIAGNOSIS — R778 Other specified abnormalities of plasma proteins: Secondary | ICD-10-CM

## 2020-04-16 DIAGNOSIS — R0789 Other chest pain: Secondary | ICD-10-CM | POA: Diagnosis not present

## 2020-04-16 DIAGNOSIS — I2584 Coronary atherosclerosis due to calcified coronary lesion: Secondary | ICD-10-CM

## 2020-04-16 DIAGNOSIS — E785 Hyperlipidemia, unspecified: Secondary | ICD-10-CM | POA: Diagnosis not present

## 2020-04-16 DIAGNOSIS — R001 Bradycardia, unspecified: Secondary | ICD-10-CM

## 2020-04-16 LAB — LIPID PANEL
Cholesterol: 193 mg/dL (ref 0–200)
HDL: 74 mg/dL (ref >40)
LDL Cholesterol: 106 mg/dL — ABNORMAL HIGH (ref 0–99)
Total CHOL/HDL Ratio: 2.6 RATIO
Triglycerides: 67 mg/dL (ref <150)
VLDL: 13 mg/dL (ref 0–40)

## 2020-04-16 LAB — NM MYOCAR MULTI W/SPECT W/WALL MOTION / EF
Estimated workload: 1 METS
Exercise duration (min): 0 min
Exercise duration (sec): 0 s
LV dias vol: 59 mL (ref 62–150)
LV sys vol: 21 mL
MPHR: 147 {beats}/min
Peak HR: 79 {beats}/min
Percent HR: 53 %
Rest HR: 57 {beats}/min
SDS: 3
SRS: 2
SSS: 3
TID: 1.03

## 2020-04-16 LAB — HEPARIN LEVEL (UNFRACTIONATED): Heparin Unfractionated: 1.1 IU/mL — ABNORMAL HIGH (ref 0.30–0.70)

## 2020-04-16 LAB — ECHOCARDIOGRAM COMPLETE
Height: 66 in
Weight: 3049.6 oz

## 2020-04-16 LAB — CBC
HCT: 36.8 % — ABNORMAL LOW (ref 39.0–52.0)
Hemoglobin: 12.8 g/dL — ABNORMAL LOW (ref 13.0–17.0)
MCH: 31.4 pg (ref 26.0–34.0)
MCHC: 34.8 g/dL (ref 30.0–36.0)
MCV: 90.2 fL (ref 80.0–100.0)
Platelets: 191 10*3/uL (ref 150–400)
RBC: 4.08 MIL/uL — ABNORMAL LOW (ref 4.22–5.81)
RDW: 12.9 % (ref 11.5–15.5)
WBC: 7.3 10*3/uL (ref 4.0–10.5)
nRBC: 0 % (ref 0.0–0.2)

## 2020-04-16 LAB — HEPATIC FUNCTION PANEL
ALT: 14 U/L (ref 0–44)
AST: 20 U/L (ref 15–41)
Albumin: 3.5 g/dL (ref 3.5–5.0)
Alkaline Phosphatase: 56 U/L (ref 38–126)
Bilirubin, Direct: <0.1 mg/dL (ref 0.0–0.2)
Total Bilirubin: 0.6 mg/dL (ref 0.3–1.2)
Total Protein: 6.7 g/dL (ref 6.5–8.1)

## 2020-04-16 LAB — HEMOGLOBIN A1C
Hgb A1c MFr Bld: 5.8 % — ABNORMAL HIGH (ref 4.8–5.6)
Mean Plasma Glucose: 119.76 mg/dL

## 2020-04-16 LAB — TROPONIN I (HIGH SENSITIVITY): Troponin I (High Sensitivity): 144 ng/L (ref <18)

## 2020-04-16 LAB — TSH: TSH: 1.748 u[IU]/mL (ref 0.350–4.500)

## 2020-04-16 MED ORDER — PHENOBARBITAL 32.4 MG PO TABS: 64.8000 mg | ORAL_TABLET | Freq: Two times a day (BID) | ORAL | Status: DC

## 2020-04-16 MED ORDER — REGADENOSON 0.4 MG/5ML IV SOLN
0.4000 mg | Freq: Once | INTRAVENOUS | Status: AC
  Administered 2020-04-16: 0.4 mg via INTRAVENOUS

## 2020-04-16 MED ORDER — ATORVASTATIN CALCIUM 80 MG PO TABS: 80.0000 mg | ORAL_TABLET | Freq: Every day | ORAL | Status: DC

## 2020-04-16 MED ORDER — PERFLUTREN LIPID MICROSPHERE
1.0000 mL | INTRAVENOUS | Status: AC | PRN
  Administered 2020-04-16: 4.5 mL via INTRAVENOUS
  Filled 2020-04-16: qty 10

## 2020-04-16 MED ORDER — ATORVASTATIN CALCIUM 80 MG PO TABS: 80.0000 mg | ORAL_TABLET | Freq: Every day | ORAL | 0 refills | Status: DC

## 2020-04-16 MED ORDER — TECHNETIUM TC 99M TETROFOSMIN IV KIT
9.9400 | PACK | Freq: Once | INTRAVENOUS | Status: AC | PRN
  Administered 2020-04-16: 9.94 via INTRAVENOUS

## 2020-04-16 MED ORDER — TECHNETIUM TC 99M TETROFOSMIN IV KIT
30.7000 | PACK | Freq: Once | INTRAVENOUS | Status: AC | PRN
  Administered 2020-04-16: 30.7 via INTRAVENOUS

## 2020-04-16 MED ORDER — ASPIRIN 81 MG PO TBEC: 81.0000 mg | DELAYED_RELEASE_TABLET | Freq: Every day | ORAL | 0 refills | Status: DC

## 2020-04-16 NOTE — Progress Notes (Signed)
ANTICOAGULATION CONSULT NOTE  Pharmacy Consult for Heparin Indication: chest pain/ACS  Patient Measurements: Height: 5\' 6"  (167.6 cm) Weight: 86.5 kg (190 lb 9.6 oz) IBW/kg (Calculated) : 63.8 HEPARIN DW (KG): 81.8  Vital Signs: Temp: 98.1 F (36.7 C) (05/15 0406) Temp Source: Oral (05/15 0406) BP: 120/57 (05/15 0406) Pulse Rate: 52 (05/15 0406)  Labs: Recent Labs    04/15/20 1615 04/15/20 1807 04/15/20 1959 04/15/20 2200 04/15/20 2222 04/16/20 0026  HGB 12.9*  --   --   --   --   --   HCT 38.6*  --   --   --   --   --   PLT 195  --   --   --   --   --   APTT  --   --   --   --  26  --   LABPROT  --   --   --   --  13.2  --   INR  --   --   --   --  1.0  --   CREATININE 1.23  --   --   --   --   --   TROPONINIHS 20*   < > 36* 63*  --  144*   < > = values in this interval not displayed.   Estimated Creatinine Clearance: 55.2 mL/min (by C-G formula based on SCr of 1.23 mg/dL).  Medical History: Past Medical History:  Diagnosis Date  . Seizures ([facility])    Medications:  No medications prior to admission.   Assessment: Asked to initiate Heparin for ACS.  HS troponins up to 144 No anticoagulants PTA per current med list.  Heparin Course: 5/14 initiation: 4000 unit bolus, then 1200 units/hr 5/15 0727 HL 1.10: dec rate to 1000 units/hr  Goal of Therapy:  Heparin level 0.3-0.7 units/ml Monitor platelets by anticoagulation protocol: Yes   Plan:   Hold infusion one hour, then decrease rate to 1000 units/hr  Re-check heparin level 8 hours after heparin rate change  CBC in am  6/15 04/16/2020,7:03 AM

## 2020-04-16 NOTE — Progress Notes (Signed)
Lab called with critical value Troponin of 144. Trending up from 20,30,36 now 144. Patient has no chest pain currently. Notified Ouma NP. Pt on Heparin infusion on 23ml/hr and resting comfortably.

## 2020-04-16 NOTE — Plan of Care (Signed)

## 2020-04-16 NOTE — Plan of Care (Signed)
Discharge instructions provided to pt.  All questions addressed.  Understanding verified through teach back.  Awaiting transportation home via POV.   Problem: Education: Goal: Knowledge of General Education information will improve Description: Including pain rating scale, medication(s)/side effects and non-pharmacologic comfort measures 04/16/2020 1524 by Phylis Bougie, RN Outcome: Adequate for Discharge 04/16/2020 1437 by Phylis Bougie, RN Outcome: Progressing   Problem: Health Behavior/Discharge Planning: Goal: Ability to manage health-related needs will improve 04/16/2020 1524 by Phylis Bougie, RN Outcome: Adequate for Discharge 04/16/2020 1437 by Phylis Bougie, RN Outcome: Progressing   Problem: Clinical Measurements: Goal: Ability to maintain clinical measurements within normal limits will improve 04/16/2020 1524 by Phylis Bougie, RN Outcome: Adequate for Discharge 04/16/2020 1437 by Phylis Bougie, RN Outcome: Progressing Goal: Will remain free from infection 04/16/2020 1524 by Phylis Bougie, RN Outcome: Adequate for Discharge 04/16/2020 1437 by Phylis Bougie, RN Outcome: Progressing Goal: Diagnostic test results will improve 04/16/2020 1524 by Phylis Bougie, RN Outcome: Adequate for Discharge 04/16/2020 1437 by Phylis Bougie, RN Outcome: Progressing Goal: Respiratory complications will improve 04/16/2020 1524 by Phylis Bougie, RN Outcome: Adequate for Discharge 04/16/2020 1437 by Phylis Bougie, RN Outcome: Progressing Goal: Cardiovascular complication will be avoided 04/16/2020 1524 by Phylis Bougie, RN Outcome: Adequate for Discharge 04/16/2020 1437 by Phylis Bougie, RN Outcome: Progressing   Problem: Activity: Goal: Risk for activity intolerance will decrease 04/16/2020 1524 by Phylis Bougie, RN Outcome: Adequate for Discharge 04/16/2020 1437 by Phylis Bougie, RN Outcome:  Progressing   Problem: Nutrition: Goal: Adequate nutrition will be maintained 04/16/2020 1524 by Phylis Bougie, RN Outcome: Adequate for Discharge 04/16/2020 1437 by Phylis Bougie, RN Outcome: Progressing   Problem: Coping: Goal: Level of anxiety will decrease 04/16/2020 1524 by Phylis Bougie, RN Outcome: Adequate for Discharge 04/16/2020 1437 by Phylis Bougie, RN Outcome: Progressing   Problem: Elimination: Goal: Will not experience complications related to bowel motility 04/16/2020 1524 by Phylis Bougie, RN Outcome: Adequate for Discharge 04/16/2020 1437 by Phylis Bougie, RN Outcome: Progressing Goal: Will not experience complications related to urinary retention 04/16/2020 1524 by Phylis Bougie, RN Outcome: Adequate for Discharge 04/16/2020 1437 by Phylis Bougie, RN Outcome: Progressing   Problem: Pain Managment: Goal: General experience of comfort will improve 04/16/2020 1524 by Phylis Bougie, RN Outcome: Adequate for Discharge 04/16/2020 1437 by Phylis Bougie, RN Outcome: Progressing   Problem: Safety: Goal: Ability to remain free from injury will improve 04/16/2020 1524 by Phylis Bougie, RN Outcome: Adequate for Discharge 04/16/2020 1437 by Phylis Bougie, RN Outcome: Progressing   Problem: Skin Integrity: Goal: Risk for impaired skin integrity will decrease 04/16/2020 1524 by Phylis Bougie, RN Outcome: Adequate for Discharge 04/16/2020 1437 by Phylis Bougie, RN Outcome: Progressing

## 2020-04-16 NOTE — Discharge Summary (Signed)
[facility] [place] [facility]   PATIENT NAME: Mitchell Barnes    MR#:  [id number]  DATE OF BIRTH:  Nov 10, 1946  DATE OF ADMISSION:  04/15/2020 ADMITTING PHYSICIAN: Andris Baumann, MD  DATE OF DISCHARGE: 04/16/2020  PRIMARY CARE PHYSICIAN: Lorenda Ishihara, MD    ADMISSION DIAGNOSIS:  Other chest pain [R07.89] ACS (acute coronary syndrome) (HCC) [I24.9] Chest pain [R07.9]  DISCHARGE DIAGNOSIS:  Active Problems:   Chest pain radiating to arm   Chest pain   Elevated troponin   SECONDARY DIAGNOSIS:   Past Medical History:  Diagnosis Date  . Seizures (HCC)     HOSPITAL COURSE:   1.  Atypical chest pain.  CT scan of the chest negative for pulmonary embolism.  Stress test was read as a low risk scan.  Cardiology okay with discharging home.  Cardiology did recommend changing his pravastatin over to Lipitor secondary to coronary calcifications.  Continue aspirin.  Patient feeling better and wants to go home. 2.  Seizure disorder on phenobarbital 3.  Hyperlipidemia.  Cardiology recommends changing pravastatin over the Lipitor 4.  Elevated blood pressure on presentation could be secondary to pain.  Last blood pressure 125/75.  Continue to monitor as outpatient  DISCHARGE CONDITIONS:   Satisfactory  CONSULTS OBTAINED:  Treatment Team:  Antonieta Iba, MD  DRUG ALLERGIES:   Allergies  Allergen Reactions  . Ritalin [Methylphenidate Hcl] Rash    DISCHARGE MEDICATIONS:   Allergies as of 04/16/2020      Reactions   Ritalin [methylphenidate Hcl] Rash      Medication List    STOP taking these medications   pravastatin 40 MG tablet Commonly known as: PRAVACHOL     TAKE these medications   aspirin 81 MG EC tablet Take 1 tablet (81 mg total) by mouth daily. Start taking on: Apr 17, 2020   atorvastatin 80 MG tablet Commonly known as: LIPITOR Take 1 tablet (80 mg total) by mouth daily. Start taking on: Apr 17, 2020   ferrous  sulfate 325 (65 FE) MG tablet Take 325 mg by mouth 2 (two) times a week. Monday, Thursday   PHENobarbital 32.4 MG tablet Commonly known as: LUMINAL Take 64.8 mg by mouth 2 (two) times daily.   vitamin B-12 500 MCG tablet Commonly known as: CYANOCOBALAMIN Take 500 mcg by mouth daily.        DISCHARGE INSTRUCTIONS:   Follow-up PMD 5 days  If you experience worsening of your admission symptoms, develop shortness of breath, life threatening emergency, suicidal or homicidal thoughts you must seek medical attention immediately by calling 911 or calling your MD immediately  if symptoms less severe.  You Must read complete instructions/literature along with all the possible adverse reactions/side effects for all the Medicines you take and that have been prescribed to you. Take any new Medicines after you have completely understood and accept all the possible adverse reactions/side effects.   Please note  You were cared for by a hospitalist during your hospital stay. If you have any questions about your discharge medications or the care you received while you were in the hospital after you are discharged, you can call the unit and asked to speak with the hospitalist on call if the hospitalist that took care of you is not available. Once you are discharged, your primary care physician will handle any further medical issues. Please note that NO REFILLS for any discharge medications will be authorized once you are discharged, as it is imperative that  you return to your primary care physician (or establish a relationship with a primary care physician if you do not have one) for your aftercare needs so that they can reassess your need for medications and monitor your lab values.    Today   CHIEF COMPLAINT:   Chief Complaint  Patient presents with  . Chest Pain    HISTORY OF PRESENT ILLNESS:  Mitchell Barnes  is a 74 y.o. male came in with chest pain   VITAL SIGNS:  Blood pressure  125/75, pulse 70, temperature 97.9 F (36.6 C), temperature source Oral, resp. rate 16, height 5\' 6"  (1.676 m), weight 86.5 kg, SpO2 96 %.   PHYSICAL EXAMINATION:  GENERAL:  74 y.o.-year-old patient lying in the bed with no acute distress.  EYES: Pupils equal, round, reactive to light and accommodation. No scleral icterus. HEENT: Head atraumatic, normocephalic. Oropharynx and nasopharynx clear.   LUNGS: Normal breath sounds bilaterally, no wheezing, rales,rhonchi or crepitation. No use of accessory muscles of respiration.  CARDIOVASCULAR: S1, S2 normal. No murmurs, rubs, or gallops.  ABDOMEN: Soft, non-tender, non-distended. Bowel sounds present. No organomegaly or mass.  EXTREMITIES: No pedal edema, cyanosis, or clubbing.  NEUROLOGIC: Cranial nerves II through XII are intact. Muscle strength 5/5 in all extremities. Sensation intact. Gait not checked.  PSYCHIATRIC: The patient is alert and oriented x 3.  SKIN: No obvious rash, lesion, or ulcer.   DATA REVIEW:   CBC Recent Labs  Lab 04/16/20 0727  WBC 7.3  HGB 12.8*  HCT 36.8*  PLT 191    Chemistries  Recent Labs  Lab 04/15/20 1615 04/16/20 0016  NA 140  --   K 4.7  --   CL 107  --   CO2 26  --   GLUCOSE 109*  --   BUN 20  --   CREATININE 1.23  --   CALCIUM 9.2  --   AST  --  20  ALT  --  14  ALKPHOS  --  56  BILITOT  --  0.6    Cardiac Enzymes First troponin 30 next troponin 36 next troponin 63 and last one 144  Microbiology Results  Results for orders placed or performed during the hospital encounter of 04/15/20  SARS Coronavirus 2 by RT PCR (hospital order, performed in [facility] hospital lab) Nasopharyngeal Nasopharyngeal Swab     Status: None   Collection Time: 04/15/20  8:44 PM   Specimen: Nasopharyngeal Swab  Result Value Ref Range Status   SARS Coronavirus 2 NEGATIVE NEGATIVE Final    Comment: (NOTE) SARS-CoV-2 target nucleic acids are NOT DETECTED. The SARS-CoV-2 RNA is generally detectable in  upper and lower respiratory specimens during the acute phase of infection. The lowest concentration of SARS-CoV-2 viral copies this assay can detect is 250 copies / mL. A negative result does not preclude SARS-CoV-2 infection and should not be used as the sole basis for treatment or other patient management decisions.  A negative result may occur with improper specimen collection / handling, submission of specimen other than nasopharyngeal swab, presence of viral mutation(s) within the areas targeted by this assay, and inadequate number of viral copies (<250 copies / mL). A negative result must be combined with clinical observations, patient history, and epidemiological information. Fact Sheet for Patients:   StrictlyIdeas.no Fact Sheet for Healthcare Providers: BankingDealers.co.za This test is not yet approved or cleared  by the [place] FDA and has been authorized for detection and/or diagnosis of SARS-CoV-2 by FDA under  an Emergency Use Authorization (EUA).  This EUA will remain in effect (meaning this test can be used) for the duration of the COVID-19 declaration under Section 564(b)(1) of the Act, 21 U.S.C. section 360bbb-3(b)(1), unless the authorization is terminated or revoked sooner. Performed at [facility], [address] [place], [place] [address]     RADIOLOGY:  DG Chest 2 View  Result Date: 04/15/2020 CLINICAL DATA:  Chest pain extending to left shoulder. EXAM: CHEST - 2 VIEW COMPARISON:  None. FINDINGS: Heart size is exaggerated by low lung volumes. No edema or effusion is present. Minimal airspace disease is noted posteriorly in the lower lobes bilaterally, left greater than right. This likely reflects atelectasis. Axial skeleton unremarkable. IMPRESSION: 1. Minimal bibasilar airspace disease likely reflects atelectasis. 2. Low lung volumes. 3. No acute cardiopulmonary disease. Electronically Signed   By:  Marin Roberts M.D.   On: 04/15/2020 17:40   CT Angio Chest PE W and/or Wo Contrast  Result Date: 04/15/2020 CLINICAL DATA:  Shortness of breath EXAM: CT ANGIOGRAPHY CHEST WITH CONTRAST TECHNIQUE: Multidetector CT imaging of the chest was performed using the standard protocol during bolus administration of intravenous contrast. Multiplanar CT image reconstructions and MIPs were obtained to evaluate the vascular anatomy. CONTRAST:  80mL OMNIPAQUE IOHEXOL 350 MG/ML SOLN COMPARISON:  Film from earlier in the same day. FINDINGS: Cardiovascular: Atherosclerotic calcifications of the thoracic aorta are noted. No aneurysmal dilatation or dissection is seen. No cardiac enlargement is noted. Pulmonary artery shows a normal branching pattern. No focal filling defect to suggest pulmonary embolism is seen. Coronary calcifications are noted. Mediastinum/Nodes: Thoracic inlet is within normal limits. No sizable hilar or mediastinal adenopathy is noted. The esophagus is within normal limits. Lungs/Pleura: Mild dependent atelectatic changes are noted. No focal infiltrate or sizable effusion is seen. No sizable parenchymal nodules are noted. Upper Abdomen: Visualized upper abdomen is within normal limits. Musculoskeletal: Degenerative changes of the thoracic spine are seen. Review of the MIP images confirms the above findings. IMPRESSION: No evidence of pulmonary emboli. Mild dependent atelectatic changes are noted. Aortic Atherosclerosis (ICD10-I70.0). Electronically Signed   By: Alcide Clever M.D.   On: 04/15/2020 20:09   NM Myocar Multi W/Spect W/Wall Motion / EF  Result Date: 04/16/2020 Pharmacological myocardial perfusion imaging study with no significant  ischemia Normal wall motion, EF estimated at 81% No EKG changes concerning for ischemia at peak stress or in recovery. CT attenuation correction images with coronary calcification noted, minimal aortic atherosclerosis Low risk scan Signed, Dossie Arbour, MD, Ph.D  [facility]   ECHOCARDIOGRAM COMPLETE  Result Date: 04/16/2020    ECHOCARDIOGRAM REPORT   Patient Name:   Mitchell Barnes Date of Exam: 04/16/2020 Medical Rec #:  [id number]             Height:       66.0 in Accession #:    [id number]            Weight:       190.6 lb Date of Birth:  Oct 03, 1946             BSA:          1.959 m Patient Age:    73 years              BP:           120/57 mmHg Patient Gender: M  HR:           67 bpm. Exam Location:  [facility] Procedure: 2D Echo and Intracardiac Opacification Agent Indications:     Chest Pain 786.50/ R07.9  History:         Patient has no prior history of Echocardiogram examinations.  Sonographer:     Wonda Cerise RDCS Referring Phys:  [id number] Andris Baumann Diagnosing Phys: Julien Nordmann MD  Sonographer Comments: Technically difficult study due to poor echo windows. Image acquisition challenging due to patient body habitus. IMPRESSIONS  1. Left ventricular ejection fraction, by estimation, is 55 to 60%. The left ventricle has normal function. The left ventricle has no regional wall motion abnormalities. There is mild left ventricular hypertrophy. Left ventricular diastolic parameters are consistent with Grade I diastolic dysfunction (impaired relaxation).  2. Right ventricular systolic function is normal. The right ventricular size is normal. Tricuspid regurgitation signal is inadequate for assessing PA pressure.  3. There is mild dilatation of the aortic root measuring 37 mm. FINDINGS  Left Ventricle: Left ventricular ejection fraction, by estimation, is 55 to 60%. The left ventricle has normal function. The left ventricle has no regional wall motion abnormalities. Definity contrast agent was given IV to delineate the left ventricular  endocardial borders. The left ventricular internal cavity size was normal in size. There is mild left ventricular hypertrophy. Left ventricular diastolic parameters are consistent with Grade I diastolic  dysfunction (impaired relaxation). Right Ventricle: The right ventricular size is normal. No increase in right ventricular wall thickness. Right ventricular systolic function is normal. Tricuspid regurgitation signal is inadequate for assessing PA pressure. Left Atrium: Left atrial size was normal in size. Right Atrium: Right atrial size was normal in size. Pericardium: There is no evidence of pericardial effusion. Mitral Valve: The mitral valve is normal in structure. Normal mobility of the mitral valve leaflets. No evidence of mitral valve regurgitation. No evidence of mitral valve stenosis. Tricuspid Valve: The tricuspid valve is normal in structure. Tricuspid valve regurgitation is not demonstrated. No evidence of tricuspid stenosis. Aortic Valve: The aortic valve is normal in structure. Aortic valve regurgitation is not visualized. No aortic stenosis is present. Aortic valve peak gradient measures 4.0 mmHg. Pulmonic Valve: The pulmonic valve was normal in structure. Pulmonic valve regurgitation is not visualized. No evidence of pulmonic stenosis. Aorta: The aortic root is normal in size and structure. There is mild dilatation of the aortic root measuring 37 mm. Venous: The inferior vena cava is normal in size with greater than 50% respiratory variability, suggesting right atrial pressure of 3 mmHg. IAS/Shunts: No atrial level shunt detected by color flow Doppler.  LEFT VENTRICLE PLAX 2D LVIDd:         4.87 cm  Diastology LVIDs:         3.42 cm  LV e' lateral:   6.09 cm/s LV PW:         1.30 cm  LV E/e' lateral: 11.6 LV IVS:        1.39 cm  LV e' medial:    6.42 cm/s LVOT diam:     2.10 cm  LV E/e' medial:  11.0 LV SV:         67 LV SV Index:   34 LVOT Area:     3.46 cm  RIGHT VENTRICLE RV Basal diam:  3.92 cm RV S prime:     19.00 cm/s TAPSE (M-mode): 2.8 cm LEFT ATRIUM           Index LA  diam:      2.90 cm 1.48 cm/m LA Vol (A2C): 18.9 ml 9.65 ml/m LA Vol (A4C): 29.1 ml 14.85 ml/m  AORTIC VALVE                 PULMONIC VALVE AV Area (Vmax): 2.94 cm    PV Vmax:       1.06 m/s AV Vmax:        100.00 cm/s PV Peak grad:  4.5 mmHg AV Peak Grad:   4.0 mmHg LVOT Vmax:      84.90 cm/s LVOT Vmean:     53.000 cm/s LVOT VTI:       0.194 m  AORTA Ao Root diam: 3.70 cm Ao Asc diam:  3.40 cm MITRAL VALVE MV Area (PHT): 3.37 cm    SHUNTS MV Decel Time: 225 msec    Systemic VTI:  0.19 m MV E velocity: 70.70 cm/s  Systemic Diam: 2.10 cm MV A velocity: 87.40 cm/s MV E/A ratio:  0.81 Julien Nordmann MD Electronically signed by Julien Nordmann MD Signature Date/Time: 04/16/2020/12:17:14 PM    Final      Management plans discussed with the patient, and he is in agreement.  CODE STATUS:     Code Status Orders  (From admission, onward)         Start     Ordered   04/15/20 2111  Full code  Continuous     04/15/20 2118        Code Status History    This patient has a current code status but no historical code status.   Advance Care Planning Activity      TOTAL TIME TAKING CARE OF THIS PATIENT: 35 minutes.    Alford Highland M.D on 04/16/2020 at 2:47 PM  Between 7am to 6pm - Pager - [phone]  After 6pm go to [email or web address] - password [email or web address]  [facility]  CC: Primary care physician; Lorenda Ishihara, MD

## 2020-04-16 NOTE — Plan of Care (Signed)
Pt reports persistent heaviness in his chest s/p NM Myoview stress test.  Denies NV or SOB.  Family at bedside.   Problem: Education: Goal: Knowledge of General Education information will improve Description: Including pain rating scale, medication(s)/side effects and non-pharmacologic comfort measures Outcome: Progressing   Problem: Health Behavior/Discharge Planning: Goal: Ability to manage health-related needs will improve Outcome: Progressing   Problem: Clinical Measurements: Goal: Ability to maintain clinical measurements within normal limits will improve Outcome: Progressing Goal: Will remain free from infection Outcome: Progressing Goal: Diagnostic test results will improve Outcome: Progressing Goal: Respiratory complications will improve Outcome: Progressing Goal: Cardiovascular complication will be avoided Outcome: Progressing   Problem: Activity: Goal: Risk for activity intolerance will decrease Outcome: Progressing   Problem: Nutrition: Goal: Adequate nutrition will be maintained Outcome: Progressing   Problem: Coping: Goal: Level of anxiety will decrease Outcome: Progressing   Problem: Elimination: Goal: Will not experience complications related to bowel motility Outcome: Progressing Goal: Will not experience complications related to urinary retention Outcome: Progressing   Problem: Pain Managment: Goal: General experience of comfort will improve Outcome: Progressing   Problem: Safety: Goal: Ability to remain free from injury will improve Outcome: Progressing   Problem: Skin Integrity: Goal: Risk for impaired skin integrity will decrease Outcome: Progressing

## 2020-04-16 NOTE — Consult Note (Signed)
Cardiology Consultation:   Patient ID: Mitchell Barnes MRN: [id number]; DOB: 01-Apr-1946  Admit date: 04/15/2020 Date of Consult: 04/16/2020  Primary Care Provider: Lorenda Ishihara, MD Primary Cardiologist:[facility], Dr. Mariah Barnes Primary Electrophysiologist:  None    Patient Profile:   Mitchell Barnes is a 74 y.o. male with a history of seizures and anemia and family history of heart disease but no personally known cardiac history, and  who is being seen today for the evaluation of chest pain at the request of Dr. Hilton Barnes.  History of Present Illness:   Mitchell Barnes is a 74 yo male with PMH as above. He has a family history of early cardiac disease on his father's side, including his father with MI in his 69s.  He reports smoking approximately 10 cigarettes in the 70s and 1 cigar.  He drinks 3 alcoholic beverages per month and usually in the form of light beer.  On Thursday, 04/14/2020, he reportedly felt substernal centrally located chest pain that was constant and rated 3/10 in severity.  The pain reportedly started while sitting in a chair and watching TV.  This pain continued and never subsided until worsening on Friday 5/14, at which point it escalated to 4/10 in severity also radiated to his his left arm and shoulder (down his L arm to approximately his mid bicep). No pleuritic pain.  No associated symptoms, including shortness of breath, diaphoresis, orthopnea, PND, lower extremity edema, nausea, emesis, racing heart rate, or palpitations.  No associated presyncope, though he did note an episode of presyncope that occurred some months ago and when standing for a long period of time.  He denied any chest discomfort with exertion.  He had never experienced chest pain like this before in the past.  Due to his chest pain, he decided to present to [facility].  He states that he was seen by a PA, which he said stated that his "EKG might be consistent with blockages or a blood  clot."  He was advised to present to the emergency department at [facility].  In the ED, he reports his chest pain was alleviated with 4 aspirin.  In the ED, vitals showed elevated blood pressure at 158/78, heart rate 60bpm and SpO2 98% ORA.  Labs showed potassium 4.7, creatinine 1.23, BUN 20.  Hgb 12.9, hematocrit 38.6, RBC 4.15, total cholesterol 193, LDL 106, triglycerides 67.  HS Tn 20  63  144.  EKG showed sinus bradycardia with right bundle branch block and LAFB, as well as nonspecific changes associated with LVH. CTA negative for pulmonary emboli with mild dependent atelectatic changes noted and aortic atherosclerosis.  Heart Pathway Score:     Past Medical History:  Diagnosis Date  . Seizures (HCC)     No past surgical history on file.   Home Medications:  Prior to Admission medications   Not on File    Inpatient Medications: Scheduled Meds: . aspirin EC  81 mg Oral Daily  . atorvastatin  10 mg Oral Daily  . metoprolol tartrate  25 mg Oral BID  . sodium chloride flush  3 mL Intravenous Once   Continuous Infusions: . heparin Stopped (04/16/20 0856)   PRN Meds: acetaminophen, ALPRAZolam, morphine injection, nitroGLYCERIN, ondansetron (ZOFRAN) IV  Allergies:    Allergies  Allergen Reactions  . Ritalin [Methylphenidate Hcl] Rash    Social History:   Social History   Socioeconomic History  . Marital status: Married    Spouse name: Not on file  . Number of  children: Not on file  . Years of education: Not on file  . Highest education level: Not on file  Occupational History  . Not on file  Tobacco Use  . Smoking status: Never Smoker  Substance and Sexual Activity  . Alcohol use: Yes  . Drug use: No  . Sexual activity: Yes  Other Topics Concern  . Not on file  Social History Narrative  . Not on file   Social Determinants of Health   Financial Resource Strain:   . Difficulty of Paying Living Expenses:   Food Insecurity:   . Worried About Programme researcher, broadcasting/film/video in  the Last Year:   . Barista in the Last Year:   Transportation Needs:   . Freight forwarder (Medical):   Mitchell Barnes Lack of Transportation (Non-Medical):   Physical Activity:   . Days of Exercise per Week:   . Minutes of Exercise per Session:   Stress:   . Feeling of Stress :   Social Connections:   . Frequency of Communication with Friends and Family:   . Frequency of Social Gatherings with Friends and Family:   . Attends Religious Services:   . Active Member of Clubs or Organizations:   . Attends Banker Meetings:   Mitchell Barnes Marital Status:   Intimate Partner Violence:   . Fear of Current or Ex-Partner:   . Emotionally Abused:   Mitchell Barnes Physically Abused:   . Sexually Abused:     Family History:   He has a family history of early cardiac disease on his father's side, including his father with MI.  ROS:  Please see the history of present illness.  Review of Systems  Respiratory: Negative for cough, hemoptysis, sputum production, shortness of breath and wheezing.   Cardiovascular: Positive for chest pain. Negative for palpitations and leg swelling.  Gastrointestinal: Negative for abdominal pain, blood in stool, melena, nausea and vomiting.  Genitourinary: Negative for hematuria.  Musculoskeletal: Negative for falls.  Neurological: Negative for dizziness and weakness.  All other systems reviewed and are negative.   All other ROS reviewed and negative.     Physical Exam/Data:   Vitals:   04/15/20 2200 04/15/20 2242 04/16/20 0406 04/16/20 0728  BP: (!) 149/84 (!) 165/83 (!) 120/57 139/77  Pulse: 65 62 (!) 52 (!) 55  Resp: 19 20 20 18   Temp:  97.9 F (36.6 C) 98.1 F (36.7 C) 97.9 F (36.6 C)  TempSrc:  Oral Oral Oral  SpO2: 97% 94% 92% 96%  Weight:  86.5 kg 86.5 kg   Height:  5\' 6"  (1.676 m)      Intake/Output Summary (Last 24 hours) at 04/16/2020 0951 Last data filed at 04/16/2020 0949 Gross per 24 hour  Intake 89 ml  Output 410 ml  Net -321 ml   Last 3  Weights 04/16/2020 04/15/2020 04/15/2020  Weight (lbs) 190 lb 9.6 oz 190 lb 9.6 oz 196 lb  Weight (kg) 86.456 kg 86.456 kg 88.905 kg     Body mass index is 30.76 kg/m.  General: Elderly male, no acute distress HEENT: normal Neck: no JVD Vascular: No carotid bruits; radial pulses 2+ bilaterally Cardiac:  normal S1, S2; bradycardic but regular; no murmur  Lungs:  clear to auscultation bilaterally with slightly reduced bibasilar breath sounds, no wheezing, rhonchi or rales  Abd: soft, nontender, no hepatomegaly  Ext: no edema Musculoskeletal:  No deformities, BUE and BLE strength normal and equal Skin: warm and dry  Neuro:  No focal abnormalities noted Psych:  Normal affect   EKG:  The EKG was personally reviewed and demonstrates:  Sinus bradycardia, 57bpm,  left axis deviation, right bundle branch block, LAFB, LVH with associated repolarization abnormalities Telemetry:  Telemetry was personally reviewed and demonstrates: Sinus bradycardia  Relevant CV Studies: Pending echo and MPI  Laboratory Data:  High Sensitivity Troponin:   Recent Labs  Lab 04/15/20 1615 04/15/20 1807 04/15/20 1959 04/15/20 2200 04/16/20 0026  TROPONINIHS 20* 30* 36* 63* 144*     Cardiac EnzymesNo results for input(s): TROPONINI in the last 168 hours. No results for input(s): TROPIPOC in the last 168 hours.  Chemistry Recent Labs  Lab 04/15/20 1615  NA 140  K 4.7  CL 107  CO2 26  GLUCOSE 109*  BUN 20  CREATININE 1.23  CALCIUM 9.2  GFRNONAA 58*  GFRAA >60  ANIONGAP 7    No results for input(s): PROT, ALBUMIN, AST, ALT, ALKPHOS, BILITOT in the last 168 hours. Hematology Recent Labs  Lab 04/15/20 1615 04/16/20 0727  WBC 6.9 7.3  RBC 4.15* 4.08*  HGB 12.9* 12.8*  HCT 38.6* 36.8*  MCV 93.0 90.2  MCH 31.1 31.4  MCHC 33.4 34.8  RDW 12.8 12.9  PLT 195 191   BNPNo results for input(s): BNP, PROBNP in the last 168 hours.  DDimer No results for input(s): DDIMER in the last 168  hours.   Radiology/Studies:  DG Chest 2 View  Result Date: 04/15/2020 CLINICAL DATA:  Chest pain extending to left shoulder. EXAM: CHEST - 2 VIEW COMPARISON:  None. FINDINGS: Heart size is exaggerated by low lung volumes. No edema or effusion is present. Minimal airspace disease is noted posteriorly in the lower lobes bilaterally, left greater than right. This likely reflects atelectasis. Axial skeleton unremarkable. IMPRESSION: 1. Minimal bibasilar airspace disease likely reflects atelectasis. 2. Low lung volumes. 3. No acute cardiopulmonary disease. Electronically Signed   By: San Morelle M.D.   On: 04/15/2020 17:40   CT Angio Chest PE W and/or Wo Contrast  Result Date: 04/15/2020 CLINICAL DATA:  Shortness of breath EXAM: CT ANGIOGRAPHY CHEST WITH CONTRAST TECHNIQUE: Multidetector CT imaging of the chest was performed using the standard protocol during bolus administration of intravenous contrast. Multiplanar CT image reconstructions and MIPs were obtained to evaluate the vascular anatomy. CONTRAST:  4mL OMNIPAQUE IOHEXOL 350 MG/ML SOLN COMPARISON:  Film from earlier in the same day. FINDINGS: Cardiovascular: Atherosclerotic calcifications of the thoracic aorta are noted. No aneurysmal dilatation or dissection is seen. No cardiac enlargement is noted. Pulmonary artery shows a normal branching pattern. No focal filling defect to suggest pulmonary embolism is seen. Coronary calcifications are noted. Mediastinum/Nodes: Thoracic inlet is within normal limits. No sizable hilar or mediastinal adenopathy is noted. The esophagus is within normal limits. Lungs/Pleura: Mild dependent atelectatic changes are noted. No focal infiltrate or sizable effusion is seen. No sizable parenchymal nodules are noted. Upper Abdomen: Visualized upper abdomen is within normal limits. Musculoskeletal: Degenerative changes of the thoracic spine are seen. Review of the MIP images confirms the above findings. IMPRESSION: No  evidence of pulmonary emboli. Mild dependent atelectatic changes are noted. Aortic Atherosclerosis (ICD10-I70.0). Electronically Signed   By: Inez Catalina M.D.   On: 04/15/2020 20:09    Assessment and Plan:   Chest pain with minimally elevated troponin Suspect supply demand ischemia --No current chest pain.  Reports chest pain that has relatively atypical features as it began at rest and was constant in duration with eventual radiation into  the left shoulder and partially down his left bicep. --No previous history of smoking.  He does have significant family history of heart disease with father reportedly having a heart attack in his 30s. --EKG without acute ST or T changes.  High-sensitivity troponin 30  144 and still cycling.  Continue to cycle until peaked and downtrending. Given his atypical CP, suspect supply demand ischemia in the setting of elevated blood pressure; however, cannot rule out ischemic etiology at this time and will continue to cycle troponin with MPI and echo ordered. --If MPI ruled low risk, and if echo without decreased EF or acute structural changes / wall motion abnormalities, he can likely follow-up in the office if further CP. Given his family history, also recommend risk factor modification with strict blood pressure control and statin. Recommend checking Hgb A1C as well for further risk factor stratification.  HTN --BP elevated at presentation.  Started on lopressor 25mg  BID. Given his bradycardic rates, and if EF normal, consider transition from current Lopressor to amlodipine for BP support without bradycardia. Given his Cr and slightly elevated K, will defer any ACE/ARB as baseline Cr unknown.  ?AKI versus CKD --Baseline Cr unknown. Will defer addition of ACE/ARB for BP as above given baseline renal function unknown with K 4.7.  HLD --Total cholesterol 193 with LDL 106. Started on statin per ED.  Recommend repeat lipid and liver function in 6 to 8 weeks and that  obtain baseline liver function as well if continue on statin.  Bradycardia, asymptomatic --Likely 2/2 BB started in the ED. Telemetry with rates in the 40s. Will check TSH.   Anemia --Reports long history of anemia. CBC checks with stable Hgb. Denies s/sx of bleeding.   History of Seizures --Continue current PTA medication.  For questions or updates, please contact [facility] Please consult [email or web address] for contact info under     Signed, , PA-C  04/16/2020 9:51 AM

## 2020-04-16 NOTE — Discharge Instructions (Signed)
Dr. Rockey Situ cardiologist wanted to change her cholesterol medication to Lipitor instead of pravastatin because you do have some coronary calcifications.  Stress test is negative and CT scan of the chest also negative.   Nonspecific Chest Pain, Adult Chest pain can be caused by many different conditions. It can be caused by a condition that is life-threatening and requires treatment right away. It can also be caused by something that is not life-threatening. If you have chest pain, it can be hard to know the difference, so it is important to get help right away to make sure that you do not have a serious condition. Some life-threatening causes of chest pain include:  Heart attack.  A tear in the body's main blood vessel (aortic dissection).  Inflammation around your heart (pericarditis).  A problem in the lungs, such as a blood clot (pulmonary embolism) or a collapsed lung (pneumothorax). Some non life-threatening causes of chest pain include:  Heartburn.  Anxiety or stress.  Damage to the bones, muscles, and cartilage that make up your chest wall.  Pneumonia or bronchitis.  Shingles infection (varicella-zoster virus). Chest pain can feel like:  Pain or discomfort on the surface of your chest or deep in your chest.  Crushing, pressure, aching, or squeezing pain.  Burning or tingling.  Dull or sharp pain that is worse when you move, cough, or take a deep breath.  Pain or discomfort that is also felt in your back, neck, jaw, shoulder, or arm, or pain that spreads to any of these areas. Your chest pain may come and go. It may also be constant. Your health care provider will do lab tests and other studies to find the cause of your pain. Treatment will depend on the cause of your chest pain. Follow these instructions at home: Medicines  Take over-the-counter and prescription medicines only as told by your health care provider.  If you were prescribed an antibiotic, take it as told  by your health care provider. Do not stop taking the antibiotic even if you start to feel better. Lifestyle   Rest as directed by your health care provider.  Do not use any products that contain nicotine or tobacco, such as cigarettes and e-cigarettes. If you need help quitting, ask your health care provider.  Do not drink alcohol.  Make healthy lifestyle choices as recommended. These may include: ? Getting regular exercise. Ask your health care provider to suggest some activities that are safe for you. ? Eating a heart-healthy diet. This includes plenty of fresh fruits and vegetables, whole grains, low-fat (lean) protein, and low-fat dairy products. A dietitian can help you find healthy eating options. ? Maintaining a healthy weight. ? Managing any other health conditions you have, such as high blood pressure (hypertension) or diabetes. ? Reducing stress, such as with yoga or relaxation techniques. General instructions  Pay attention to any changes in your symptoms. Tell your health care provider about them or any new symptoms.  Avoid any activities that cause chest pain.  Keep all follow-up visits as told by your health care provider. This is important. This includes visits for any further testing if your chest pain does not go away. Contact a health care provider if:  Your chest pain does not go away.  You feel depressed.  You have a fever. Get help right away if:  Your chest pain gets worse.  You have a cough that gets worse, or you cough up blood.  You have severe pain in your abdomen.  You faint.  You have sudden, unexplained chest discomfort.  You have sudden, unexplained discomfort in your arms, back, neck, or jaw.  You have shortness of breath at any time.  You suddenly start to sweat, or your skin gets clammy.  You feel nausea or you vomit.  You suddenly feel lightheaded or dizzy.  You have severe weakness, or unexplained weakness or fatigue.  Your  heart begins to beat quickly, or it feels like it is skipping beats. These symptoms may represent a serious problem that is an emergency. Do not wait to see if the symptoms will go away. Get medical help right away. Call your local emergency services (911 in the [place] Do not drive yourself to the hospital. Summary  Chest pain can be caused by a condition that is serious and requires urgent treatment. It may also be caused by something that is not life-threatening.  If you have chest pain, it is very important to see your health care provider. Your health care provider may do lab tests and other studies to find the cause of your pain.  Follow your health care provider's instructions on taking medicines, making lifestyle changes, and getting emergency treatment if symptoms become worse.  Keep all follow-up visits as told by your health care provider. This includes visits for any further testing if your chest pain does not go away. This information is not intended to replace advice given to you by your health care provider. Make sure you discuss any questions you have with your health care provider. Document Revised: 05/22/2018 Document Reviewed: 05/22/2018 Elsevier Patient Education  2020 [employer].

## 2020-04-16 NOTE — Progress Notes (Addendum)
Lab called and reported a critical troponin of 144. On assessment pt did not complaints of chest pain at this time. Notify [facility]. Will continue to monitor.  Update 0136: Talked to [facility] and ordered to notify her if pt complaints of chest pain. No complaints of chest pain at this time. Will continue to monitor.

## 2020-04-16 NOTE — Progress Notes (Signed)
*  PRELIMINARY RESULTS* Echocardiogram 2D Echocardiogram has been performed. Definity IV Contrast used on this study.  Mitchell Barnes 04/16/2020, 12:09 PM

## 2020-04-20 ENCOUNTER — Encounter: Payer: Self-pay | Admitting: Family

## 2020-04-20 ENCOUNTER — Other Ambulatory Visit: Payer: Self-pay

## 2020-04-20 ENCOUNTER — Ambulatory Visit (INDEPENDENT_AMBULATORY_CARE_PROVIDER_SITE_OTHER): Payer: Medicare Other | Admitting: Family

## 2020-04-20 VITALS — BP 140/86 | HR 89 | Ht 68.0 in | Wt 196.0 lb

## 2020-04-20 DIAGNOSIS — I10 Essential (primary) hypertension: Secondary | ICD-10-CM | POA: Insufficient documentation

## 2020-04-20 DIAGNOSIS — I4510 Unspecified right bundle-branch block: Secondary | ICD-10-CM | POA: Diagnosis not present

## 2020-04-20 DIAGNOSIS — I77810 Thoracic aortic ectasia: Secondary | ICD-10-CM | POA: Insufficient documentation

## 2020-04-20 DIAGNOSIS — I2510 Atherosclerotic heart disease of native coronary artery without angina pectoris: Secondary | ICD-10-CM | POA: Diagnosis not present

## 2020-04-20 DIAGNOSIS — E785 Hyperlipidemia, unspecified: Secondary | ICD-10-CM | POA: Diagnosis not present

## 2020-04-20 NOTE — Progress Notes (Signed)
Office Visit    Patient Name: Mitchell Barnes Date of Encounter: 04/20/2020  Primary Care Provider:  Lorenda Ishihara, MD Primary Cardiologist:  Julien Nordmann, MD Electrophysiologist:  None   Chief Complaint    Mitchell Barnes is a 74 y.o. male with a hx of seizures, anemia, chest pain presents today for follow up after admission for chest pain.  Past Medical History    Past Medical History:  Diagnosis Date  . Anemia   . HLD (hyperlipidemia)   . Seizures (HCC)    History reviewed. No pertinent surgical history.  Allergies  Allergies  Allergen Reactions  . Atorvastatin Other (See Comments)    Muscle cramps  . Dilantin [Phenytoin] Itching    Body turns red    History of Present Illness    Mitchell Barnes is a 73 y.o. male with a hx of seizures, anemia, HLD, HTN,  chest pain last seen while hospitalized.  Known family history of heart disease on father's side including father with MI in 74s.  Presented to urgent care with chest pain and subsequently presented to [facility] ED. Chest pain was central substernal, occurred at rest, constant duration, and eventual radiation to left shoulder and down left bicep. EKG wihtout acute changes. HS troponin 30 then 144. Suspected demand ischemia in setting of elevated BP. It was noted the day prior to onset of chest pain he was moving 40 pound boxes of tools. He was trialed on beta blocker, but bradycardic and this was discontinued. Echo 04/16/20 LVEF 55-60%, mid LVH, gr1DD, RV normal size and function, mild dilation aortic root 73mm.  Lexiscan myoview 04/16/20 with no significant ischemia, no EKG changes, CT attenuation correction images with coronary calcification and minimal aortic atherosclerosis. Recommended to transition from Pravastatin to Atorvastatin.   Reports feeling well since discharge. Reports no shortness of breath nor dyspnea on exertion. Reports no chest pain, pressure, or tightness. No edema, orthopnea,  PND. Reports no palpitations. We reviewed his echocardiogram and Lexiscan results in office today.    Reports he has never been on blood pressure medication. We reviewed goal of BP <130/80. Tells me he checks intermittently at home with readings typically 110s-120s/50s-70s.  Tells me he was previously on Atorvastatin and he noticed muscle cramps a number of years ago. He did not pick up Atorvastatin from pharmacy. Has continued his pravastatin. Discussed addition of Zetia and LDL goal <70, he is hesitant to add medication. Has an appointment Friday with his primary care provider. Tells me he will discuss with her.   EKGs/Labs/Other Studies Reviewed:   The following studies were reviewed today: lexiscan Myoview 04/16/20 Study Result  Pharmacological myocardial perfusion imaging study with no significant  ischemia Normal wall motion, EF estimated at 81% No EKG changes concerning for ischemia at peak stress or in recovery. CT attenuation correction images with coronary calcification noted, minimal aortic atherosclerosis Low risk scan    Echo 04/16/20  1. Left ventricular ejection fraction, by estimation, is 55 to 60%. The  left ventricle has normal function. The left ventricle has no regional  wall motion abnormalities. There is mild left ventricular hypertrophy.  Left ventricular diastolic parameters  are consistent with Grade I diastolic dysfunction (impaired relaxation).   2. Right ventricular systolic function is normal. The right ventricular  size is normal. Tricuspid regurgitation signal is inadequate for assessing  PA pressure.   3. There is mild dilatation of the aortic root measuring 37 mm.   EKG:  No EKG today.  EKG reviewed from 04/16/20 shows SB 57 bpm with RBBB and no acute ST/T wave changes.   Recent Labs: 04/15/2020: BUN 20; Creatinine, Ser 1.23; Potassium 4.7; Sodium 140 04/16/2020: ALT 14; Hemoglobin 12.8; Platelets 191; TSH 1.748  Recent Lipid Panel    Component Value  Date/Time   CHOL 193 04/15/2020 2141   TRIG 67 04/15/2020 2141   HDL 74 04/15/2020 2141   CHOLHDL 2.6 04/15/2020 2141   VLDL 13 04/15/2020 2141   LDLCALC 106 (H) 04/15/2020 2141    Home Medications   Current Meds  Medication Sig  . aspirin EC 81 MG EC tablet Take 1 tablet (81 mg total) by mouth daily.  . ferrous sulfate 325 (65 FE) MG tablet Take 325 mg by mouth 2 (two) times a week. Monday, Thursday  . PHENobarbital (LUMINAL) 32.4 MG tablet Take 64.8 mg by mouth 2 (two) times daily.  . pravastatin (PRAVACHOL) 40 MG tablet Take 40 mg by mouth daily.  . vitamin B-12 (CYANOCOBALAMIN) 500 MCG tablet Take 500 mcg by mouth daily.    Review of Systems     Review of Systems  Constitution: Negative for chills, fever and malaise/fatigue.  Cardiovascular: Negative for chest pain, dyspnea on exertion, leg swelling, near-syncope, orthopnea, palpitations and syncope.  Respiratory: Negative for cough, shortness of breath and wheezing.   Gastrointestinal: Negative for nausea and vomiting.  Neurological: Negative for dizziness, light-headedness and weakness.   All other systems reviewed and are otherwise negative except as noted above.  Physical Exam    VS:  BP 140/86 (BP Location: Left Arm, Patient Position: Sitting, Cuff Size: Normal)   Pulse 89   Ht 5\' 8"  (1.727 m)   Wt 196 lb (88.9 kg)   SpO2 97%   BMI 29.80 kg/m  , BMI Body mass index is 29.8 kg/m. GEN: Well nourished, overweight, overweight, well developed, in no acute distress. HEENT: normal. Neck: Supple, no JVD, carotid bruits, or masses. Cardiac: RRR, no murmurs, rubs, or gallops. No clubbing, cyanosis, edema.  Radials/PT 2+ and equal bilaterally.  Respiratory:  Respirations regular and unlabored, clear to auscultation bilaterally. GI: Soft, nontender, nondistended, BS + x 4. MS: No deformity or atrophy. Skin: Warm and dry, no rash. Neuro:  Strength and sensation are intact. Psych: Normal affect.  Assessment & Plan      1. Chest pain/coronary artery calcification on CT - Reports no recurrent chest pain. Recent stress test with no evidence of ischemia. CT showed coronary artery calcification and aortic atherosclerosis. Continue Aspirin 81mg  daily. Lipid management, as below. No beta blocker secondary to hx of bradycardia when trialed in hospital.  2. HLD - ideally LDL goal <70 in setting of coronary calcification on CT. Intolerant of Atorvastatin with muscle cramps. Continue Pravastatin 40mg  daily. Recommend addition of Zetia 10mg  daily for LDL 106. He politely declines and wishes to discuss first with his primary care provider. Has upcoming appointment Friday.   3. HTN - BP elevated today with SBP 140. Reports Bp is well controlled at home. Hesitant regarding antihypertensive medication. Discussed low sodium diet and regular cardiovascular exercise. Encouraged to check Bp three times per week at home and keep a log. BP was labile while admitted.   4. RBBB - Noted on EKG while hospitalized. No lightheadedness, dizziness, near syncope or symptoms suggestive of heart block. Continue to monitor with EKG at least annually.   5. Mild dilation aortic root - Noted on echo. Likely etiology age vs hypertension. Consider repeat echo or CT in  1 year for monitoring. Recommend BP goal <130/80, as above.    Disposition: Follow up in 6 month(s) with Dr. Rockey Situ or APP.    Loel Dubonnet, NP 04/20/2020, 8:59 AM

## 2020-04-20 NOTE — Patient Instructions (Addendum)
Medication Instructions:   No medication changes today.  Recommend addition of Ezetimide (Zetia) 10mg  daily. This is a medication that helps your Pravastatin to work more effectively to lower your LDL or "lousy cholesterol". If you would like to send in the prescription, please call our office. If you and your primary care provider decide to start it, she can likely prescribe it as well. Our goal if for your LDL to be less than 70 to prevent the plaque in your coronary arteries from progression - your labs in the hospital showed your LDl was 106.   *If you need a refill on your cardiac medications before your next appointment, please call your pharmacy*  Lab Work: No lab work today.  Testing/Procedures: Your stress test showed no evidence of ischemia or blockage. Your CT scan does show that your heart has some plaque (also called atherosclerosis) which should be treated with aspirin and cholesterol medication.  Follow-Up: At [facility], you and your health needs are our priority.  As part of our continuing mission to provide you with exceptional heart care, we have created designated Provider Care Teams.  These Care Teams include your primary Cardiologist (physician) and Advanced Practice Providers (APPs -  Physician Assistants and Nurse Practitioners) who all work together to provide you with the care you need, when you need it.  We recommend signing up for the patient portal called "MyChart".  Sign up information is provided on this After Visit Summary.  MyChart is used to connect with patients for Virtual Visits (Telemedicine).  Patients are able to view lab/test results, encounter notes, upcoming appointments, etc.  Non-urgent messages can be sent to your provider as well.   To learn more about what you can do with MyChart, go to [facility].    Your next appointment:   6 month(s)  The format for your next appointment:   In Person  Provider:   You may see [email or web address], MD or one of the following Advanced Practice Providers on your designated Care Team:    Julien Nordmann, NP  Nicolasa Ducking, PA-C  Eula Listen, PA-C  Other Instructions   Recommend checking your blood pressure three times per week and keeping log.  Our goal is for your blood pressure to be less than 130/80. If you notice your blood pressure is consistently more than 130/80, please call our office or your primary care provider. At that time we might consider starting a blood pressure medication. Eating a low salt diet and regular cardiovascular exercise can help prevent high blood pressure.    Recommend taking your blood pressure cuff to your primary care appointment Friday to check the accuracy.   Fat and Cholesterol Restricted Eating Plan Getting too much fat and cholesterol in your diet may cause health problems. Choosing the right foods helps keep your fat and cholesterol at normal levels. This can keep you from getting certain diseases.   What are tips for following this plan? Meal planning  At meals, divide your plate into four equal parts: ? Fill one-half of your plate with vegetables and green salads. ? Fill one-fourth of your plate with whole grains. ? Fill one-fourth of your plate with low-fat (lean) protein foods.  Eat fish that is high in omega-3 fats at least two times a week. This includes mackerel, tuna, sardines, and salmon.  Eat foods that are high in fiber, such as whole grains, beans, apples, broccoli, carrots, peas, and barley. General tips   Work with your  doctor to lose weight if you need to.  Avoid: ? Foods with added sugar. ? Fried foods. ? Foods with partially hydrogenated oils.  Limit alcohol intake to no more than 1 drink a day for nonpregnant women and 2 drinks a day for men. One drink equals 12 oz of beer, 5 oz of wine, or 1 oz of hard liquor. Reading food labels  Check food labels for: ? Trans fats. ? Partially hydrogenated  oils. ? Saturated fat (g) in each serving. ? Cholesterol (mg) in each serving. ? Fiber (g) in each serving.  Choose foods with healthy fats, such as: ? Monounsaturated fats. ? Polyunsaturated fats. ? Omega-3 fats.  Choose grain products that have whole grains. Look for the word "whole" as the first word in the ingredient list. Cooking  Cook foods using low-fat methods. These include baking, boiling, grilling, and broiling.  Eat more home-cooked foods. Eat at restaurants and buffets less often.  Avoid cooking using saturated fats, such as butter, cream, palm oil, palm kernel oil, and coconut oil. Recommended foods  Fruits  All fresh, canned (in natural juice), or frozen fruits. Vegetables  Fresh or frozen vegetables (raw, steamed, roasted, or grilled). Green salads. Grains  Whole grains, such as whole wheat or whole grain breads, crackers, cereals, and pasta. Unsweetened oatmeal, bulgur, barley, quinoa, or brown rice. Corn or whole wheat flour tortillas. Meats and other protein foods  Ground beef (85% or leaner), grass-fed beef, or beef trimmed of fat. Skinless chicken or [place]. Ground chicken or [place]. Pork trimmed of fat. All fish and seafood. Egg whites. Dried beans, peas, or lentils. Unsalted nuts or seeds. Unsalted canned beans. Nut butters without added sugar or oil. Dairy  Low-fat or nonfat dairy products, such as skim or 1% milk, 2% or reduced-fat cheeses, low-fat and fat-free ricotta or cottage cheese, or plain low-fat and nonfat yogurt. Fats and oils  Tub margarine without trans fats. Light or reduced-fat mayonnaise and salad dressings. Avocado. Olive, canola, sesame, or safflower oils. The items listed above may not be a complete list of foods and beverages you can eat. Contact a dietitian for more information. Foods to avoid Fruits  Canned fruit in heavy syrup. Fruit in cream or butter sauce. Fried fruit. Vegetables  Vegetables cooked in cheese, cream, or  butter sauce. Fried vegetables. Grains  White bread. White pasta. White rice. Cornbread. Bagels, pastries, and croissants. Crackers and snack foods that contain trans fat and hydrogenated oils. Meats and other protein foods  Fatty cuts of meat. Ribs, chicken wings, bacon, sausage, bologna, salami, chitterlings, fatback, hot dogs, bratwurst, and packaged lunch meats. Liver and organ meats. Whole eggs and egg yolks. Chicken and [place] with skin. Fried meat. Dairy  Whole or 2% milk, cream, half-and-half, and cream cheese. Whole milk cheeses. Whole-fat or sweetened yogurt. Full-fat cheeses. Nondairy creamers and whipped toppings. Processed cheese, cheese spreads, and cheese curds. Beverages  Alcohol. Sugar-sweetened drinks such as sodas, lemonade, and fruit drinks. Fats and oils  Butter, stick margarine, lard, shortening, ghee, or bacon fat. Coconut, palm kernel, and palm oils. Sweets and desserts  Corn syrup, sugars, honey, and molasses. Candy. Jam and jelly. Syrup. Sweetened cereals. Cookies, pies, cakes, donuts, muffins, and ice cream. The items listed above may not be a complete list of foods and beverages you should avoid. Contact a dietitian for more information. Summary  Choosing the right foods helps keep your fat and cholesterol at normal levels. This can keep you from getting certain diseases.  At meals, fill one-half of your plate with vegetables and green salads.  Eat high-fiber foods, like whole grains, beans, apples, carrots, peas, and barley.  Limit added sugar, saturated fats, alcohol, and fried foods. This information is not intended to replace advice given to you by your health care provider. Make sure you discuss any questions you have with your health care provider. Document Revised: 07/23/2018 Document Reviewed: 08/06/2017 Elsevier Patient Education  2020 Elsevier Inc.  Ezetimibe Tablets What is this medicine? EZETIMIBE (ez ET i mibe) blocks the absorption of  cholesterol from the stomach. It can help lower blood cholesterol for patients who are at risk of getting heart disease or a stroke. It is only for patients whose cholesterol level is not controlled by diet. This medicine may be used for other purposes; ask your health care provider or pharmacist if you have questions. COMMON BRAND NAME(S): Zetia What should I tell my health care provider before I take this medicine? They need to know if you have any of these conditions:  liver disease  an unusual or allergic reaction to ezetimibe, medicines, foods, dyes, or preservatives  pregnant or trying to get pregnant  breast-feeding How should I use this medicine? Take this medicine by mouth with a glass of water. Follow the directions on the prescription label. This medicine can be taken with or without food. Take your doses at regular intervals. Do not take your medicine more often than directed. Talk to your pediatrician regarding the use of this medicine in children. Special care may be needed. Overdosage: If you think you have taken too much of this medicine contact a poison control center or emergency room at once. NOTE: This medicine is only for you. Do not share this medicine with others. What if I miss a dose? If you miss a dose, take it as soon as you can. If it is almost time for your next dose, take only that dose. Do not take double or extra doses. What may interact with this medicine? Do not take this medicine with any of the following medications:  fenofibrate  gemfibrozil This medicine may also interact with the following medications:  antacids  cyclosporine  herbal medicines like red yeast rice  other medicines to lower cholesterol or triglycerides This list may not describe all possible interactions. Give your health care provider a list of all the medicines, herbs, non-prescription drugs, or dietary supplements you use. Also tell them if you smoke, drink alcohol, or use  illegal drugs. Some items may interact with your medicine. What should I watch for while using this medicine? Visit your doctor or health care professional for regular checks on your progress. You will need to have your cholesterol levels checked. If you are also taking some other cholesterol medicines, you will also need to have tests to make sure your liver is working properly. Tell your doctor or health care professional if you get any unexplained muscle pain, tenderness, or weakness, especially if you also have a fever and tiredness. You need to follow a low-cholesterol, low-fat diet while you are taking this medicine. This will decrease your risk of getting heart and blood vessel disease. Exercising and avoiding alcohol and smoking can also help. Ask your doctor or dietician for advice. What side effects may I notice from receiving this medicine? Side effects that you should report to your doctor or health care professional as soon as possible:  allergic reactions like skin rash, itching or hives, swelling of the face, lips,  or tongue  dark yellow or brown urine  unusually weak or tired  yellowing of the skin or eyes Side effects that usually do not require medical attention (report to your doctor or health care professional if they continue or are bothersome):  diarrhea  dizziness  headache  stomach upset or pain This list may not describe all possible side effects. Call your doctor for medical advice about side effects. You may report side effects to FDA at 1-800-FDA-1088. Where should I keep my medicine? Keep out of the reach of children. Store at room temperature between 15 and 30 degrees C (59 and 86 degrees F). Protect from moisture. Keep container tightly closed. Throw away any unused medicine after the expiration date. NOTE: This sheet is a summary. It may not cover all possible information. If you have questions about this medicine, talk to your doctor, pharmacist, or health  care provider.  2020 Elsevier/Gold Standard (2012-05-26 15:39:09)

## 2020-07-20 ENCOUNTER — Ambulatory Visit
Admission: EM | Admit: 2020-07-20 | Discharge: 2020-07-20 | Disposition: A | Discharge status: Home / Self Care | Payer: Medicare Other

## 2020-07-20 DIAGNOSIS — R21 Rash and other nonspecific skin eruption: Secondary | ICD-10-CM

## 2020-07-20 NOTE — ED Provider Notes (Signed)
Renaldo Fiddler    CSN: [id number] Arrival date & time: 07/20/20  1306      History   Chief Complaint Chief Complaint  Patient presents with   Rash    HPI Mitchell Barnes is a 74 y.o. male.   Patient presents with a nonpruritic nontender nondraining papular red rash on his arms since this morning.  No associated symptoms.  No new medications, detergents, products, foods.  No treatment attempted at home.  He denies fever, chills, sore throat, cough, shortness of breath, abdominal pain, or other symptoms.  The history is provided by the patient.    Past Medical History:  Diagnosis Date   Anemia    HLD (hyperlipidemia)    Seizures (HCC)     Patient Active Problem List   Diagnosis Date Noted   Coronary artery calcification seen on CT scan 04/20/2020   Hyperlipidemia LDL goal <70 04/20/2020   Essential hypertension 04/20/2020   Right bundle branch block 04/20/2020   Aortic root dilation (HCC) 04/20/2020   Seizure (HCC)    Hyperlipidemia    Chest pain radiating to arm 04/15/2020   Atypical chest pain 04/15/2020   Elevated troponin 04/15/2020    Past Surgical History:  Procedure Laterality Date   EYE SURGERY         Home Medications    Prior to Admission medications   Medication Sig Start Date End Date Taking? Authorizing Provider  aspirin EC 81 MG EC tablet Take 1 tablet (81 mg total) by mouth daily. 04/17/20   Alford Highland, MD  ferrous sulfate 325 (65 FE) MG tablet Take 325 mg by mouth 2 (two) times a week. Monday, Thursday    [provider]  PHENobarbital (LUMINAL) 32.4 MG tablet Take 64.8 mg by mouth 2 (two) times daily. 04/11/20   [provider]  pravastatin (PRAVACHOL) 40 MG tablet Take 40 mg by mouth daily. 09/03/15   [provider]  vitamin B-12 (CYANOCOBALAMIN) 500 MCG tablet Take 500 mcg by mouth daily.    [provider]    Family History Family History  Problem Relation Age of Onset    Heart attack Father     Social History Social History   Tobacco Use   Smoking status: Never Smoker   Smokeless tobacco: Never Used  Substance Use Topics   Alcohol use: Yes   Drug use: No     Allergies   Atorvastatin and Dilantin [phenytoin]   Review of Systems Review of Systems  Constitutional: Negative for chills and fever.  HENT: Negative for ear pain and sore throat.   Eyes: Negative for pain and visual disturbance.  Respiratory: Negative for cough and shortness of breath.   Cardiovascular: Negative for chest pain and palpitations.  Gastrointestinal: Negative for abdominal pain and vomiting.  Genitourinary: Negative for dysuria and hematuria.  Musculoskeletal: Negative for arthralgias and back pain.  Skin: Positive for rash. Negative for color change.  Neurological: Negative for seizures and syncope.  All other systems reviewed and are negative.    Physical Exam Triage Vital Signs ED Triage Vitals  Enc Vitals Group     BP 07/20/20 1309 122/86     Pulse Rate 07/20/20 1309 70     Resp 07/20/20 1309 14     Temp 07/20/20 1309 98 F (36.7 C)     Temp src --      SpO2 07/20/20 1309 98 %     Weight --      Height --  Head Circumference --      Peak Flow --      Pain Score 07/20/20 1307 0     Pain Loc --      Pain Edu? --      Excl. in GC? --    No data found.  Updated Vital Signs BP 122/86    Pulse 70    Temp 98 F (36.7 C)    Resp 14    SpO2 98%   Visual Acuity Right Eye Distance:   Left Eye Distance:   Bilateral Distance:    Right Eye Near:   Left Eye Near:    Bilateral Near:     Physical Exam Vitals and nursing note reviewed.  Constitutional:      General: He is not in acute distress.    Appearance: He is well-developed. He is not ill-appearing.  HENT:     Head: Normocephalic and atraumatic.     Mouth/Throat:     Mouth: Mucous membranes are moist.     Pharynx: Oropharynx is clear.  Eyes:     Conjunctiva/sclera: Conjunctivae  normal.  Cardiovascular:     Rate and Rhythm: Normal rate and regular rhythm.     Heart sounds: No murmur heard.   Pulmonary:     Effort: Pulmonary effort is normal. No respiratory distress.     Breath sounds: Normal breath sounds.  Abdominal:     Palpations: Abdomen is soft.     Tenderness: There is no abdominal tenderness. There is no guarding or rebound.  Musculoskeletal:     Cervical back: Neck supple.  Skin:    General: Skin is warm and dry.     Findings: Rash present.     Comments: Red papular rash on arms.  No drainage.  Neurological:     General: No focal deficit present.     Mental Status: He is alert and oriented to person, place, and time.     Gait: Gait normal.  Psychiatric:        Mood and Affect: Mood normal.        Behavior: Behavior normal.      UC Treatments / Results  Labs (all labs ordered are listed, but only abnormal results are displayed) Labs Reviewed - No data to display  EKG   Radiology No results found.  Procedures Procedures (including critical care time)  Medications Ordered in UC Medications - No data to display  Initial Impression / Assessment and Plan / UC Course  I have reviewed the triage vital signs and the nursing notes.  Pertinent labs & imaging results that were available during my care of the patient were reviewed by me and considered in my medical decision making (see chart for details).   Rash.  Instructed patient to apply hydrocortisone cream to the rash.  Instructed him to follow-up with his PCP if his symptoms are not improving.  Patient agrees to plan of care.   Final Clinical Impressions(s) / UC Diagnoses   Final diagnoses:  Rash and nonspecific skin eruption     Discharge Instructions     Apply hydrocortisone cream to the rash as directed.    Follow up with your primary care provider if your symptoms are not improving.       ED Prescriptions    None     PDMP not reviewed this encounter.   Mickie Bail, NP 07/20/20 1343

## 2020-07-20 NOTE — ED Triage Notes (Signed)
Patient reports he woke up this morning with rash on his bilateral upper extremities. Denies pain or itching.

## 2020-07-20 NOTE — Discharge Instructions (Addendum)
Apply hydrocortisone cream to the rash as directed.    Follow up with your primary care provider if your symptoms are not improving.

## 2020-10-21 NOTE — Progress Notes (Signed)
Cardiology Office Note  Date:  10/24/2020   ID:  Mitchell Barnes, DOB [birth year], MRN [id number]  PCP:  Mitchell Ishihara, MD   Chief Complaint  Patient presents with  . Follow-up    6 months  Pt states he has had Cataract surgery since he was here last; Pt states no new cardiac Sx.    HPI:  Mitchell Barnes is a 74 y.o. male with a hx of seizures,  Anemia, HLD,  HTN,   Nonsmoker, no diabetes HAB1C 5.8 chest pain, prior hospitalization August 2021, negative stress test Who presents for follow-up of his chest pain  Feels well,  Feels that prior chest pain was secondary to muscular ligamental strain No muscle issue in the chest since his last clinic visit Active in the yard Cannot do what he used to do, does not do the mowing  Lab work reviewed Total chol 193, LDL 106 now on pravastatin  Strong family father (smoker), CAD, MI  EKG personally reviewed by myself on todays visit Shows normal sinus rhythm rate 64 bpm right bundle branch block  Prior records reviewed, Presented to urgent care with chest pain  Referred to the [facility] ED.    HS troponin 30 then 144. Suspected demand ischemia in setting of elevated BP. was moving 40 pound boxes of tools.  He was trialed on beta blocker, but bradycardic and this was discontinued.   Echo 04/16/20 LVEF 55-60%, mid LVH, gr1DD, RV normal size and function, mild dilation aortic root 71mm.  Lexiscan myoview 04/16/20 with no significant ischemia, no EKG changes, CT attenuation correction images with coronary calcification and minimal aortic atherosclerosis.   Known family history of heart disease on father's side including father with MI in 36s.  lexiscan Myoview 04/16/20 Study Result  Pharmacological myocardial perfusion imaging study with no significant ischemia Normal wall motion, EF estimated at 81% No EKG changes concerning for ischemia at peak stress or in recovery. CT attenuation correction images with  coronary calcification noted, minimal aortic atherosclerosis Low risk scan    Echo 04/16/20 1. Left ventricular ejection fraction, by estimation, is 55 to 60%. The  left ventricle has normal function. The left ventricle has no regional  wall motion abnormalities. There is mild left ventricular hypertrophy.  Left ventricular diastolic parameters  are consistent with Grade I diastolic dysfunction (impaired relaxation).  2. Right ventricular systolic function is normal. The right ventricular  size is normal. Tricuspid regurgitation signal is inadequate for assessing  PA pressure.  3. There is mild dilatation of the aortic root measuring 37 mm.   PMH:   has a past medical history of Anemia, HLD (hyperlipidemia), and Seizures (HCC).  PSH:    Past Surgical History:  Procedure Laterality Date  . EYE SURGERY      Current Outpatient Medications  Medication Sig Dispense Refill  . aspirin EC 81 MG EC tablet Take 1 tablet (81 mg total) by mouth daily. (Patient taking differently: Take 81 mg by mouth. Every Monday, Wednesday, and Friday.) 30 tablet 0  . ferrous sulfate 325 (65 FE) MG tablet Take 325 mg by mouth 2 (two) times a week. Monday, Thursday    . PHENobarbital (LUMINAL) 32.4 MG tablet Take 64.8 mg by mouth 2 (two) times daily.    . pravastatin (PRAVACHOL) 40 MG tablet Take 40 mg by mouth daily.    . vitamin B-12 (CYANOCOBALAMIN) 500 MCG tablet Take 500 mcg by mouth daily.     No current facility-administered medications for this visit.  Allergies:   Atorvastatin and Dilantin [phenytoin]   Social History:  The patient  reports that he has never smoked. He has never used smokeless tobacco. He reports current alcohol use. He reports that he does not use drugs.   Family History:   family history includes Heart attack in his father.   Review of Systems: Review of Systems  Constitutional: Negative.   HENT: Negative.   Respiratory: Negative.   Cardiovascular: Negative.    Gastrointestinal: Negative.   Musculoskeletal: Negative.   Neurological: Negative.   Psychiatric/Behavioral: Negative.   All other systems reviewed and are negative.   PHYSICAL EXAM: VS:  BP 124/68   Pulse 64   Ht 5\' 8"  (1.727 m)   Wt 195 lb (88.5 kg)   BMI 29.65 kg/m  , BMI Body mass index is 29.65 kg/m. GEN: Well nourished, well developed, in no acute distress HEENT: normal Neck: no JVD, carotid bruits, or masses Cardiac: RRR; no murmurs, rubs, or gallops,no edema  Respiratory:  clear to auscultation bilaterally, normal work of breathing GI: soft, nontender, nondistended, + BS MS: no deformity or atrophy Skin: warm and dry, no rash Neuro:  Strength and sensation are intact Psych: euthymic mood, full affect  Recent Labs: 04/15/2020: BUN 20; Creatinine, Ser 1.23; Potassium 4.7; Sodium 140 04/16/2020: ALT 14; Hemoglobin 12.8; Platelets 191; TSH 1.748    Lipid Panel Lab Results  Component Value Date   CHOL 193 04/15/2020   HDL 74 04/15/2020   LDLCALC 106 (H) 04/15/2020   TRIG 67 04/15/2020      Wt Readings from Last 3 Encounters:  10/24/20 195 lb (88.5 kg)  04/20/20 196 lb (88.9 kg)  04/16/20 190 lb 9.6 oz (86.5 kg)     ASSESSMENT AND PLAN:  Problem List Items Addressed This Visit      Cardiology Problems   Coronary artery calcification seen on CT scan   Relevant Orders   EKG 12-Lead   Hyperlipidemia LDL goal <70   Essential hypertension   Aortic root dilation (HCC) - Primary   Hyperlipidemia    Other Visit Diagnoses    Chest pain of uncertain etiology       Relevant Orders   EKG 12-Lead     Chest pain Prior episode likely musculoskeletal in nature Currently with no symptoms of angina. No further workup at this time. Continue current medication regimen. Calcification noted on CT scan  Hyperlipidemia Recommend he continue aspirin with pravastatin  Dilated aortic root Minimally dilated on echo No repeat imaging needed at this time   Total  encounter time more than 25 minutes  Greater than 50% was spent in counseling and coordination of care with the patient   Signed, 04/18/20, M.D., Ph.D. [facility] [place], [place]

## 2020-10-24 ENCOUNTER — Other Ambulatory Visit: Payer: Self-pay

## 2020-10-24 ENCOUNTER — Ambulatory Visit: Payer: Medicare Other | Admitting: Cardiovascular Disease

## 2020-10-24 VITALS — BP 124/68 | HR 64 | Ht 68.0 in | Wt 195.0 lb

## 2020-10-24 DIAGNOSIS — E785 Hyperlipidemia, unspecified: Secondary | ICD-10-CM

## 2020-10-24 DIAGNOSIS — I2510 Atherosclerotic heart disease of native coronary artery without angina pectoris: Secondary | ICD-10-CM | POA: Diagnosis not present

## 2020-10-24 DIAGNOSIS — I77810 Thoracic aortic ectasia: Secondary | ICD-10-CM

## 2020-10-24 DIAGNOSIS — R079 Chest pain, unspecified: Secondary | ICD-10-CM

## 2020-10-24 DIAGNOSIS — E782 Mixed hyperlipidemia: Secondary | ICD-10-CM

## 2020-10-24 DIAGNOSIS — I10 Essential (primary) hypertension: Secondary | ICD-10-CM

## 2020-10-24 NOTE — Patient Instructions (Addendum)
Medication Instructions:  No changes  If you need a refill on your cardiac medications before your next appointment, please call your pharmacy.    Lab work: No new labs needed   If you have labs (blood work) drawn today and your tests are completely normal, you will receive your results only by: . MyChart Message (if you have MyChart) OR . A paper copy in the mail If you have any lab test that is abnormal or we need to change your treatment, we will call you to review the results.   Testing/Procedures: No new testing needed   Follow-Up: At [facility], you and your health needs are our priority.  As part of our continuing mission to provide you with exceptional heart care, we have created designated Provider Care Teams.  These Care Teams include your primary Cardiologist (physician) and Advanced Practice Providers (APPs -  Physician Assistants and Nurse Practitioners) who all work together to provide you with the care you need, when you need it.  . You will need a follow up appointment as needed  . Providers on your designated Care Team:   . Christopher Berge, NP . Ryan Dunn, PA-C . Jacquelyn Visser, PA-C  Any Other Special Instructions Will Be Listed Below (If Applicable).  COVID-19 Vaccine Information can be found at: [email or web address] For questions related to vaccine distribution or appointments, please email [email or web address] or call [phone].     

## 2021-01-30 DIAGNOSIS — W19XXXS Unspecified fall, sequela: Secondary | ICD-10-CM | POA: Diagnosis not present

## 2021-01-30 DIAGNOSIS — M47814 Spondylosis without myelopathy or radiculopathy, thoracic region: Secondary | ICD-10-CM | POA: Diagnosis not present

## 2021-01-30 DIAGNOSIS — M47812 Spondylosis without myelopathy or radiculopathy, cervical region: Secondary | ICD-10-CM | POA: Diagnosis not present

## 2021-03-14 DIAGNOSIS — Z79899 Other long term (current) drug therapy: Secondary | ICD-10-CM | POA: Diagnosis not present

## 2021-03-14 DIAGNOSIS — Z23 Encounter for immunization: Secondary | ICD-10-CM | POA: Diagnosis not present

## 2021-03-17 ENCOUNTER — Other Ambulatory Visit: Payer: Self-pay | Admitting: Internal Medicine

## 2021-03-17 DIAGNOSIS — E291 Testicular hypofunction: Secondary | ICD-10-CM

## 2021-03-25 ENCOUNTER — Other Ambulatory Visit: Payer: Self-pay

## 2021-03-25 ENCOUNTER — Ambulatory Visit
Admission: RE | Admit: 2021-03-25 | Discharge: 2021-03-25 | Disposition: A | Discharge status: Home / Self Care | Payer: Medicare Other | Source: Ambulatory Visit | Attending: Internal Medicine | Admitting: Internal Medicine

## 2021-03-25 DIAGNOSIS — M810 Age-related osteoporosis without current pathological fracture: Secondary | ICD-10-CM | POA: Diagnosis not present

## 2021-03-25 DIAGNOSIS — E291 Testicular hypofunction: Secondary | ICD-10-CM

## 2021-08-08 ENCOUNTER — Other Ambulatory Visit: Payer: Self-pay

## 2021-08-08 ENCOUNTER — Encounter: Payer: Self-pay | Admitting: Emergency Medicine

## 2021-08-08 ENCOUNTER — Ambulatory Visit
Admission: EM | Admit: 2021-08-08 | Discharge: 2021-08-08 | Disposition: A | Discharge status: Home / Self Care | Payer: Medicare Other | Attending: Emergency Medicine | Admitting: Emergency Medicine

## 2021-08-08 DIAGNOSIS — W57XXXA Bitten or stung by nonvenomous insect and other nonvenomous arthropods, initial encounter: Secondary | ICD-10-CM

## 2021-08-08 DIAGNOSIS — S0006XA Insect bite (nonvenomous) of scalp, initial encounter: Secondary | ICD-10-CM | POA: Diagnosis not present

## 2021-08-08 DIAGNOSIS — L0390 Cellulitis, unspecified: Secondary | ICD-10-CM

## 2021-08-08 MED ORDER — MUPIROCIN 2 % EX OINT
1.0000 | TOPICAL_OINTMENT | Freq: Two times a day (BID) | CUTANEOUS | 0 refills | Status: DC
Start: 2021-08-08 — End: 2022-04-19

## 2021-08-08 NOTE — ED Triage Notes (Signed)
Pt here with two spots on left side of head. One spot on forehead and other to the scalp. Noticed about 2 days ago.

## 2021-08-08 NOTE — ED Provider Notes (Signed)
[name]    CSN: [id number] Arrival date & time: 08/08/21  7829      History   Chief Complaint Chief Complaint  Patient presents with   Rash    HPI Mitchell Barnes is a 75 y.o. male.  Patient presents with 2 insect bites on his scalp that occurred 2 days ago.  He states the areas have improved greatly; decreased in size and redness.  He denies fever, chills, drainage, or other symptoms.  No treatments attempted at home.  His medical history includes hypertension and seizures.  The history is provided by the patient and medical records.   Past Medical History:  Diagnosis Date   Anemia    HLD (hyperlipidemia)    Seizures (HCC)     Patient Active Problem List   Diagnosis Date Noted   Coronary artery calcification seen on CT scan 04/20/2020   Hyperlipidemia LDL goal <70 04/20/2020   Essential hypertension 04/20/2020   Right bundle branch block 04/20/2020   Aortic root dilation (HCC) 04/20/2020   Seizure (HCC)    Hyperlipidemia    Chest pain radiating to arm 04/15/2020   Atypical chest pain 04/15/2020   Elevated troponin 04/15/2020    Past Surgical History:  Procedure Laterality Date   EYE SURGERY         Home Medications    Prior to Admission medications   Medication Sig Start Date End Date Taking? Authorizing Provider  mupirocin ointment (BACTROBAN) 2 % Apply 1 application topically 2 (two) times daily. 08/08/21  Yes Mickie Bail, NP  aspirin EC 81 MG EC tablet Take 1 tablet (81 mg total) by mouth daily. Patient taking differently: Take 81 mg by mouth. Every Monday, Wednesday, and Friday. 04/17/20   Alford Highland, MD  ferrous sulfate 325 (65 FE) MG tablet Take 325 mg by mouth 2 (two) times a week. Monday, Thursday    [provider]  PHENobarbital (LUMINAL) 32.4 MG tablet Take 64.8 mg by mouth 2 (two) times daily. 04/11/20   [provider]  pravastatin (PRAVACHOL) 40 MG tablet Take 40 mg by mouth daily. 09/03/15   [provider]  vitamin B-12 (CYANOCOBALAMIN) 500 MCG tablet Take 500 mcg by mouth daily.    [provider]    Family History Family History  Problem Relation Age of Onset   Heart attack Father     Social History Social History   Tobacco Use   Smoking status: Never   Smokeless tobacco: Never  Substance Use Topics   Alcohol use: Yes   Drug use: No     Allergies   Atorvastatin and Dilantin [phenytoin]   Review of Systems Review of Systems  Constitutional:  Negative for chills and fever.  Respiratory:  Negative for cough and shortness of breath.   Cardiovascular:  Negative for chest pain and palpitations.  Skin:  Positive for wound. Negative for color change.  All other systems reviewed and are negative.   Physical Exam Triage Vital Signs ED Triage Vitals  Enc Vitals Group     BP      Pulse      Resp      Temp      Temp src      SpO2      Weight      Height      Head Circumference      Peak Flow      Pain Score      Pain Loc  Pain Edu?      Excl. in GC?    No data found.  Updated Vital Signs BP (!) 145/77   Pulse 72   Temp 98.8 F (37.1 C) (Oral)   Resp 18   SpO2 96%   Visual Acuity Right Eye Distance:   Left Eye Distance:   Bilateral Distance:    Right Eye Near:   Left Eye Near:    Bilateral Near:     Physical Exam Vitals and nursing note reviewed.  Constitutional:      General: He is not in acute distress.    Appearance: He is well-developed.  HENT:     Head: Normocephalic and atraumatic.     Mouth/Throat:     Mouth: Mucous membranes are moist.  Eyes:     Conjunctiva/sclera: Conjunctivae normal.  Cardiovascular:     Rate and Rhythm: Normal rate and regular rhythm.     Heart sounds: Normal heart sounds.  Pulmonary:     Effort: Pulmonary effort is normal. No respiratory distress.     Breath sounds: Normal breath sounds.  Abdominal:     Palpations: Abdomen is soft.     Tenderness: There is no abdominal tenderness.   Musculoskeletal:     Cervical back: Neck supple.  Skin:    General: Skin is warm and dry.     Findings: Lesion present.     Comments: Scalp:  <1 cm small areas of lightly erythematous areas of induration with central scabbed lesion. No drainage.   Neurological:     General: No focal deficit present.     Mental Status: He is alert and oriented to person, place, and time.     Gait: Gait normal.  Psychiatric:        Mood and Affect: Mood normal.        Behavior: Behavior normal.     UC Treatments / Results  Labs (all labs ordered are listed, but only abnormal results are displayed) Labs Reviewed - No data to display  EKG   Radiology No results found.  Procedures Procedures (including critical care time)  Medications Ordered in UC Medications - No data to display  Initial Impression / Assessment and Plan / UC Course  I have reviewed the triage vital signs and the nursing notes.  Pertinent labs & imaging results that were available during my care of the patient were reviewed by me and considered in my medical decision making (see chart for details).  Insect bites on scalp with very mild localized cellulitis.  Treating with mupirocin ointment.  Discussed wound care and signs of infection.  Instructed patient to return here if he notes signs of infection.  He agrees to plan of care.   Final Clinical Impressions(s) / UC Diagnoses   Final diagnoses:  Insect bite of scalp, initial encounter  Cellulitis, unspecified cellulitis site     Discharge Instructions      Use the antibiotic ointment twice a day as directed.    Keep the areas clean and dry.  Wash gently twice a day with soap and water.      Return here if you see signs of infection, such as increased pain, redness, pus-like drainage, warmth, fever, chills, or other concerning symptoms.        ED Prescriptions     Medication Sig Dispense Auth. Provider   mupirocin ointment (BACTROBAN) 2 % Apply 1  application topically 2 (two) times daily. 22 g Mickie Bail, NP  PDMP not reviewed this encounter.   Mickie Bail, NP 08/08/21 1015

## 2021-08-08 NOTE — Discharge Instructions (Addendum)
Use the antibiotic ointment twice a day as directed.    Keep the areas clean and dry.  Wash gently twice a day with soap and water.      Return here if you see signs of infection, such as increased pain, redness, pus-like drainage, warmth, fever, chills, or other concerning symptoms.

## 2021-09-12 ENCOUNTER — Other Ambulatory Visit: Payer: Self-pay | Admitting: Internal Medicine

## 2021-09-12 ENCOUNTER — Ambulatory Visit
Admission: RE | Admit: 2021-09-12 | Discharge: 2021-09-12 | Disposition: A | Discharge status: Home / Self Care | Payer: Medicare Other | Source: Ambulatory Visit | Attending: Internal Medicine | Admitting: Internal Medicine

## 2021-09-12 DIAGNOSIS — E559 Vitamin D deficiency, unspecified: Secondary | ICD-10-CM | POA: Diagnosis not present

## 2021-09-12 DIAGNOSIS — M170 Bilateral primary osteoarthritis of knee: Secondary | ICD-10-CM

## 2021-09-12 DIAGNOSIS — Z79899 Other long term (current) drug therapy: Secondary | ICD-10-CM | POA: Diagnosis not present

## 2021-09-12 DIAGNOSIS — M25562 Pain in left knee: Secondary | ICD-10-CM | POA: Diagnosis not present

## 2021-09-12 DIAGNOSIS — Z23 Encounter for immunization: Secondary | ICD-10-CM | POA: Diagnosis not present

## 2021-09-12 DIAGNOSIS — Z0000 Encounter for general adult medical examination without abnormal findings: Secondary | ICD-10-CM | POA: Diagnosis not present

## 2021-09-12 DIAGNOSIS — W19XXXS Unspecified fall, sequela: Secondary | ICD-10-CM | POA: Diagnosis not present

## 2021-09-12 DIAGNOSIS — R569 Unspecified convulsions: Secondary | ICD-10-CM | POA: Diagnosis not present

## 2021-09-12 IMAGING — CR DG KNEE 1-2V*L*
2 series · 2 of 2 positions shown · non-contrast
Comparison: None.

CLINICAL DATA: Left knee pain without trauma

EXAM:
LEFT KNEE - 1-2 VIEW

[t knee ap left]
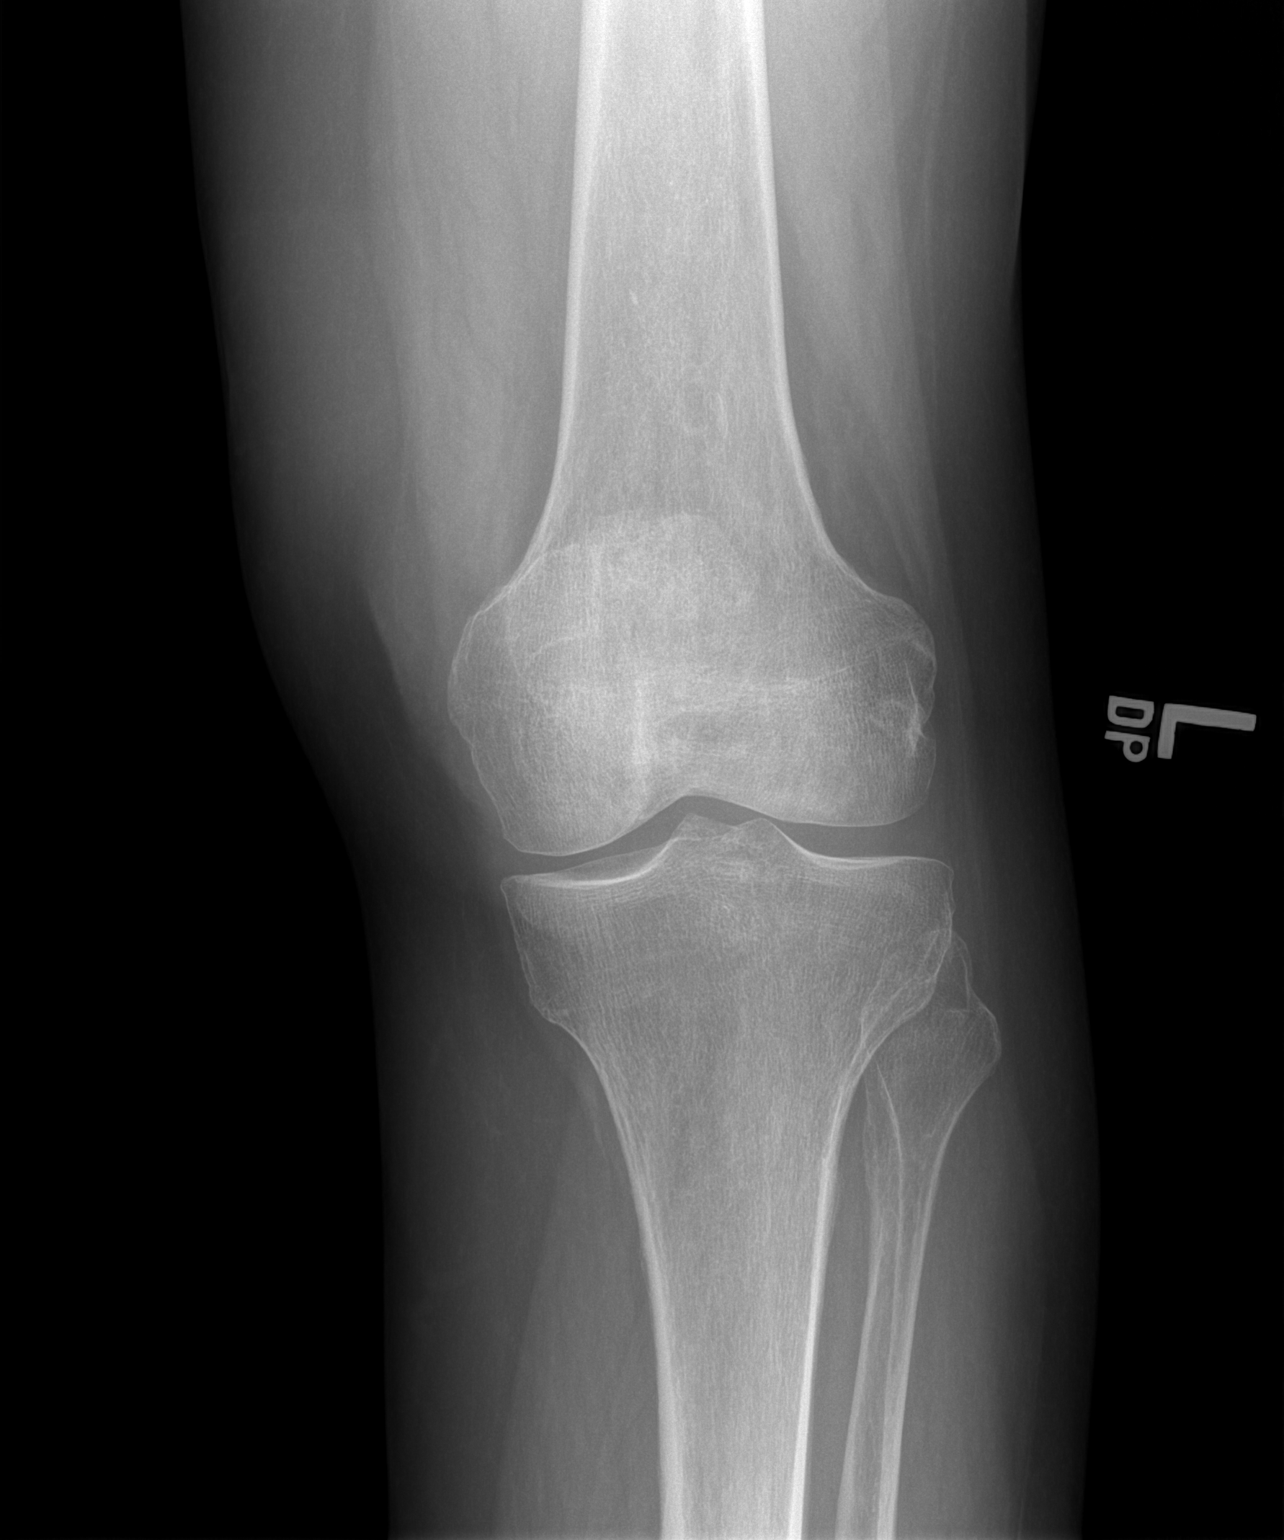

[t knee lat left]
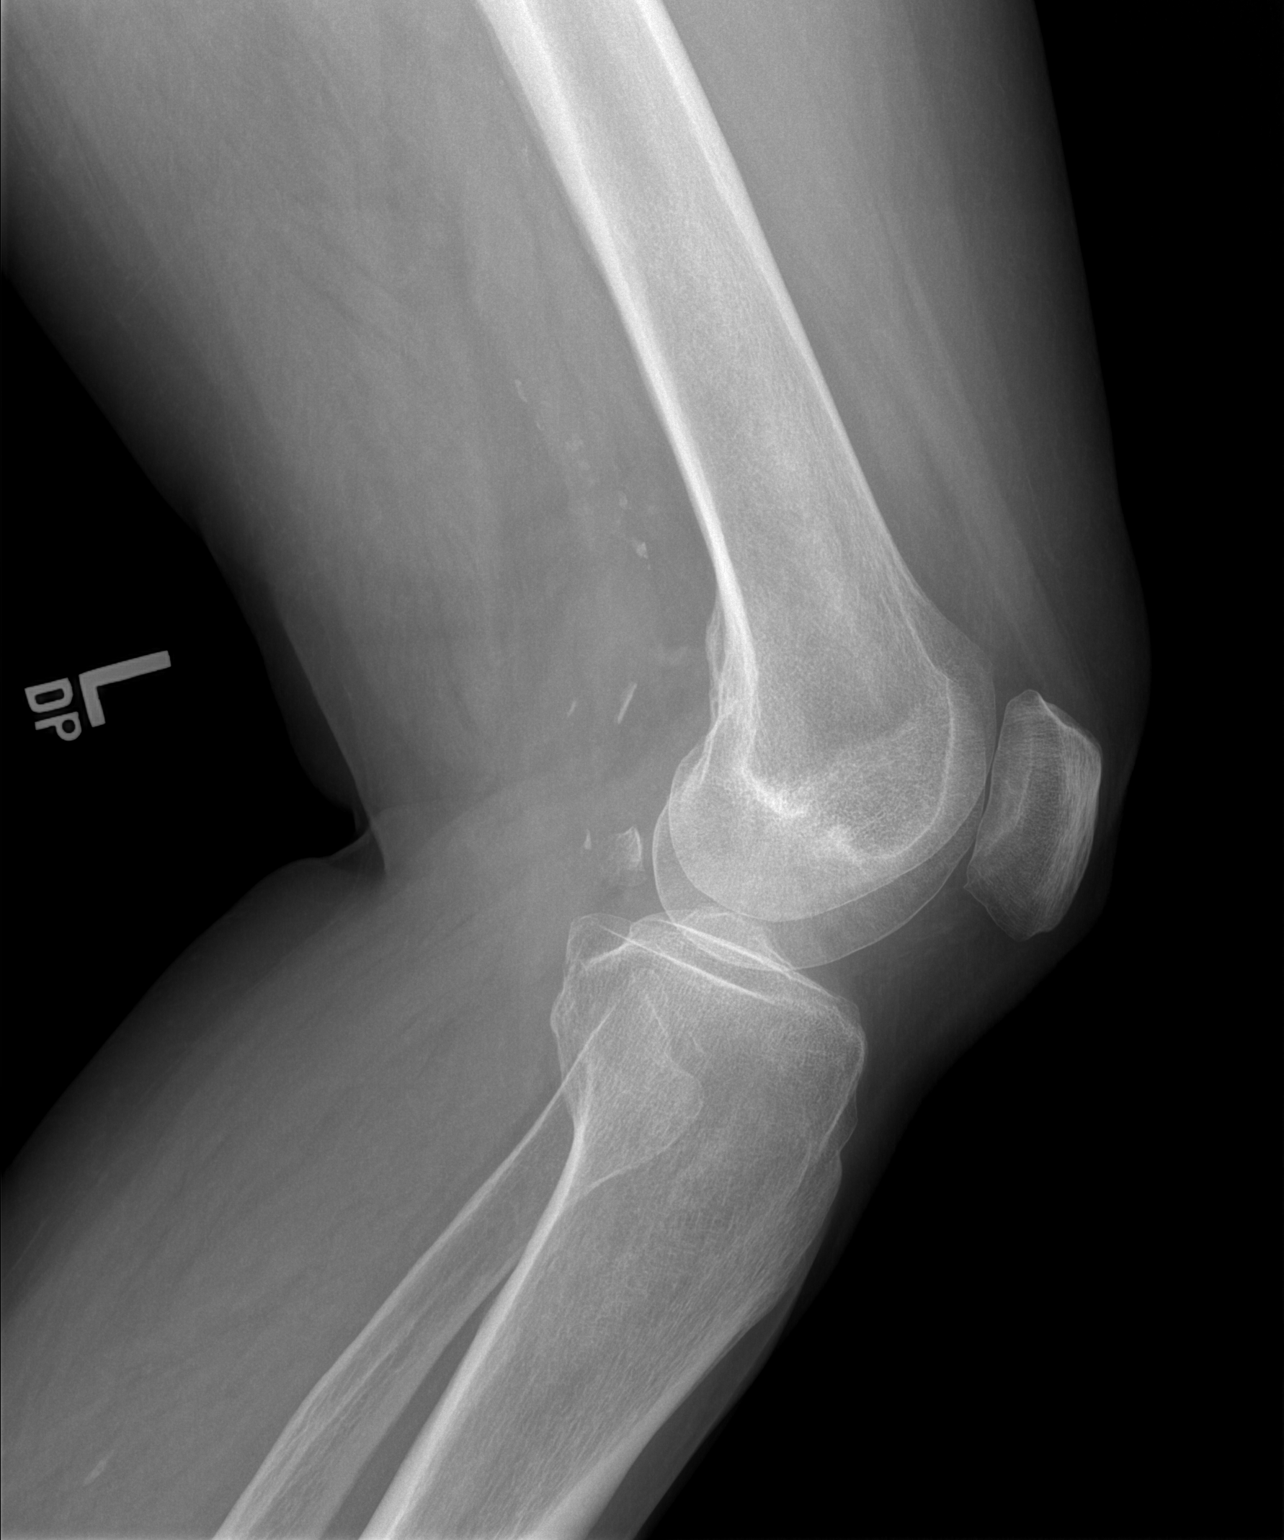

[2 of 2 positions shown; findings below may reference images not displayed]

FINDINGS: No acute fracture or dislocation. Vascular calcifications. Mild
patellofemoral joint space narrowing. No joint effusion.
IMPRESSION: No acute osseous abnormality.

Mild for age patellofemoral joint space narrowing. No other evidence
of osteoarthritis.

## 2022-01-11 DIAGNOSIS — R55 Syncope and collapse: Secondary | ICD-10-CM | POA: Diagnosis not present

## 2022-01-11 DIAGNOSIS — R42 Dizziness and giddiness: Secondary | ICD-10-CM | POA: Diagnosis not present

## 2022-01-11 DIAGNOSIS — I951 Orthostatic hypotension: Secondary | ICD-10-CM | POA: Diagnosis not present

## 2022-01-25 DIAGNOSIS — R42 Dizziness and giddiness: Secondary | ICD-10-CM | POA: Diagnosis not present

## 2022-03-13 DIAGNOSIS — E7800 Pure hypercholesterolemia, unspecified: Secondary | ICD-10-CM | POA: Diagnosis not present

## 2022-03-13 DIAGNOSIS — M47812 Spondylosis without myelopathy or radiculopathy, cervical region: Secondary | ICD-10-CM | POA: Diagnosis not present

## 2022-03-13 DIAGNOSIS — I700 Atherosclerosis of aorta: Secondary | ICD-10-CM | POA: Diagnosis not present

## 2022-03-13 DIAGNOSIS — M810 Age-related osteoporosis without current pathological fracture: Secondary | ICD-10-CM | POA: Diagnosis not present

## 2022-03-13 DIAGNOSIS — N289 Disorder of kidney and ureter, unspecified: Secondary | ICD-10-CM | POA: Diagnosis not present

## 2022-04-18 ENCOUNTER — Other Ambulatory Visit: Payer: Self-pay

## 2022-04-18 ENCOUNTER — Encounter: Payer: Self-pay | Admitting: Internal Medicine

## 2022-04-18 ENCOUNTER — Observation Stay
Admission: EM | Admit: 2022-04-18 | Discharge: 2022-04-19 | Disposition: A | Discharge status: Home / Self Care | Payer: Medicare Other | Attending: Internal Medicine | Admitting: Internal Medicine

## 2022-04-18 ENCOUNTER — Emergency Department: Payer: Medicare Other

## 2022-04-18 ENCOUNTER — Ambulatory Visit
Admission: EM | Admit: 2022-04-18 | Discharge: 2022-04-18 | Disposition: A | Discharge status: Other Acute Inpatient Hospital | Payer: Medicare Other

## 2022-04-18 DIAGNOSIS — R41841 Cognitive communication deficit: Secondary | ICD-10-CM | POA: Insufficient documentation

## 2022-04-18 DIAGNOSIS — I429 Cardiomyopathy, unspecified: Secondary | ICD-10-CM | POA: Insufficient documentation

## 2022-04-18 DIAGNOSIS — R569 Unspecified convulsions: Secondary | ICD-10-CM | POA: Diagnosis not present

## 2022-04-18 DIAGNOSIS — Z7902 Long term (current) use of antithrombotics/antiplatelets: Secondary | ICD-10-CM | POA: Insufficient documentation

## 2022-04-18 DIAGNOSIS — Z79899 Other long term (current) drug therapy: Secondary | ICD-10-CM | POA: Diagnosis not present

## 2022-04-18 DIAGNOSIS — I63211 Cerebral infarction due to unspecified occlusion or stenosis of right vertebral arteries: Secondary | ICD-10-CM | POA: Diagnosis not present

## 2022-04-18 DIAGNOSIS — H539 Unspecified visual disturbance: Secondary | ICD-10-CM

## 2022-04-18 DIAGNOSIS — R9082 White matter disease, unspecified: Secondary | ICD-10-CM | POA: Diagnosis not present

## 2022-04-18 DIAGNOSIS — D649 Anemia, unspecified: Secondary | ICD-10-CM | POA: Diagnosis not present

## 2022-04-18 DIAGNOSIS — I491 Atrial premature depolarization: Secondary | ICD-10-CM | POA: Diagnosis not present

## 2022-04-18 DIAGNOSIS — Z8673 Personal history of transient ischemic attack (TIA), and cerebral infarction without residual deficits: Secondary | ICD-10-CM | POA: Insufficient documentation

## 2022-04-18 DIAGNOSIS — I77810 Thoracic aortic ectasia: Secondary | ICD-10-CM | POA: Insufficient documentation

## 2022-04-18 DIAGNOSIS — H53461 Homonymous bilateral field defects, right side: Secondary | ICD-10-CM | POA: Insufficient documentation

## 2022-04-18 DIAGNOSIS — I6523 Occlusion and stenosis of bilateral carotid arteries: Secondary | ICD-10-CM | POA: Diagnosis not present

## 2022-04-18 DIAGNOSIS — I452 Bifascicular block: Secondary | ICD-10-CM | POA: Insufficient documentation

## 2022-04-18 DIAGNOSIS — I639 Cerebral infarction, unspecified: Secondary | ICD-10-CM | POA: Diagnosis not present

## 2022-04-18 DIAGNOSIS — G319 Degenerative disease of nervous system, unspecified: Secondary | ICD-10-CM | POA: Insufficient documentation

## 2022-04-18 DIAGNOSIS — E785 Hyperlipidemia, unspecified: Secondary | ICD-10-CM | POA: Insufficient documentation

## 2022-04-18 DIAGNOSIS — H53469 Homonymous bilateral field defects, unspecified side: Secondary | ICD-10-CM | POA: Diagnosis not present

## 2022-04-18 DIAGNOSIS — Z8249 Family history of ischemic heart disease and other diseases of the circulatory system: Secondary | ICD-10-CM | POA: Diagnosis not present

## 2022-04-18 DIAGNOSIS — G939 Disorder of brain, unspecified: Secondary | ICD-10-CM | POA: Diagnosis not present

## 2022-04-18 DIAGNOSIS — G40909 Epilepsy, unspecified, not intractable, without status epilepticus: Secondary | ICD-10-CM | POA: Insufficient documentation

## 2022-04-18 DIAGNOSIS — R918 Other nonspecific abnormal finding of lung field: Secondary | ICD-10-CM | POA: Insufficient documentation

## 2022-04-18 DIAGNOSIS — I63232 Cerebral infarction due to unspecified occlusion or stenosis of left carotid arteries: Secondary | ICD-10-CM | POA: Insufficient documentation

## 2022-04-18 DIAGNOSIS — I110 Hypertensive heart disease with heart failure: Secondary | ICD-10-CM | POA: Diagnosis not present

## 2022-04-18 DIAGNOSIS — Z7982 Long term (current) use of aspirin: Secondary | ICD-10-CM | POA: Insufficient documentation

## 2022-04-18 DIAGNOSIS — H53451 Other localized visual field defect, right eye: Secondary | ICD-10-CM | POA: Diagnosis present

## 2022-04-18 DIAGNOSIS — I5042 Chronic combined systolic (congestive) and diastolic (congestive) heart failure: Secondary | ICD-10-CM | POA: Diagnosis present

## 2022-04-18 DIAGNOSIS — R9431 Abnormal electrocardiogram [ECG] [EKG]: Secondary | ICD-10-CM | POA: Diagnosis not present

## 2022-04-18 DIAGNOSIS — R29818 Other symptoms and signs involving the nervous system: Secondary | ICD-10-CM | POA: Diagnosis not present

## 2022-04-18 DIAGNOSIS — H5347 Heteronymous bilateral field defects: Secondary | ICD-10-CM | POA: Diagnosis not present

## 2022-04-18 DIAGNOSIS — R29701 NIHSS score 1: Secondary | ICD-10-CM | POA: Insufficient documentation

## 2022-04-18 LAB — COMPREHENSIVE METABOLIC PANEL
ALT: 17 U/L (ref 0–44)
AST: 27 U/L (ref 15–41)
Albumin: 3.7 g/dL (ref 3.5–5.0)
Alkaline Phosphatase: 57 U/L (ref 38–126)
Anion gap: 7 (ref 5–15)
BUN: 21 mg/dL (ref 8–23)
CO2: 25 mmol/L (ref 22–32)
Calcium: 8.9 mg/dL (ref 8.9–10.3)
Chloride: 105 mmol/L (ref 98–111)
Creatinine, Ser: 1.27 mg/dL — ABNORMAL HIGH (ref 0.61–1.24)
GFR, Estimated: 59 mL/min — ABNORMAL LOW (ref >60)
Glucose, Bld: 102 mg/dL — ABNORMAL HIGH (ref 70–99)
Potassium: 4.3 mmol/L (ref 3.5–5.1)
Sodium: 137 mmol/L (ref 135–145)
Total Bilirubin: 0.3 mg/dL (ref 0.3–1.2)
Total Protein: 7.7 g/dL (ref 6.5–8.1)

## 2022-04-18 LAB — DIFFERENTIAL
Abs Immature Granulocytes: 0.03 10*3/uL (ref 0.00–0.07)
Basophils Absolute: 0 10*3/uL (ref 0.0–0.1)
Basophils Relative: 1 %
Eosinophils Absolute: 0.1 10*3/uL (ref 0.0–0.5)
Eosinophils Relative: 1 %
Immature Granulocytes: 0 %
Lymphocytes Relative: 31 %
Lymphs Abs: 2.5 10*3/uL (ref 0.7–4.0)
Monocytes Absolute: 0.8 10*3/uL (ref 0.1–1.0)
Monocytes Relative: 10 %
Neutro Abs: 4.6 10*3/uL (ref 1.7–7.7)
Neutrophils Relative %: 57 %

## 2022-04-18 LAB — CBC
HCT: 38.6 % — ABNORMAL LOW (ref 39.0–52.0)
Hemoglobin: 12.3 g/dL — ABNORMAL LOW (ref 13.0–17.0)
MCH: 30.3 pg (ref 26.0–34.0)
MCHC: 31.9 g/dL (ref 30.0–36.0)
MCV: 95.1 fL (ref 80.0–100.0)
Platelets: 208 10*3/uL (ref 150–400)
RBC: 4.06 MIL/uL — ABNORMAL LOW (ref 4.22–5.81)
RDW: 13 % (ref 11.5–15.5)
WBC: 8.1 10*3/uL (ref 4.0–10.5)
nRBC: 0 % (ref 0.0–0.2)

## 2022-04-18 LAB — PROTIME-INR
INR: 1 (ref 0.8–1.2)
Prothrombin Time: 13.1 seconds (ref 11.4–15.2)

## 2022-04-18 LAB — APTT: aPTT: 27 seconds (ref 24–36)

## 2022-04-18 LAB — CBG MONITORING, ED: Glucose-Capillary: 101 mg/dL — ABNORMAL HIGH (ref 70–99)

## 2022-04-18 IMAGING — MR MR MRA HEAD W/O CM
1 series · 26 of 48 positions shown · non-contrast
Comparison: CT head from the same day.

CLINICAL DATA: right sided hemianopia, eval cva; Stroke, follow up;
Neuro deficit, acute, stroke suspected



[Series 5: TOF · axial · 0.5mm · 0.48mm/px · z∈[-77,+31]mm · 26 of 243 slices shown]
[im 1/243]
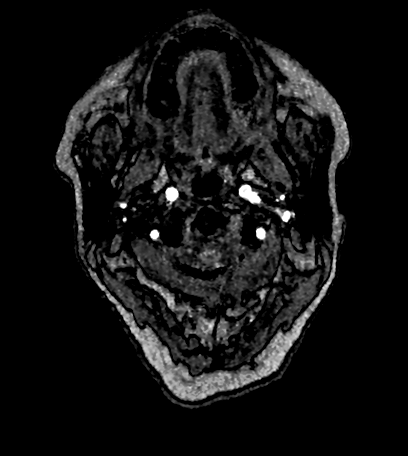
[im 6/243]
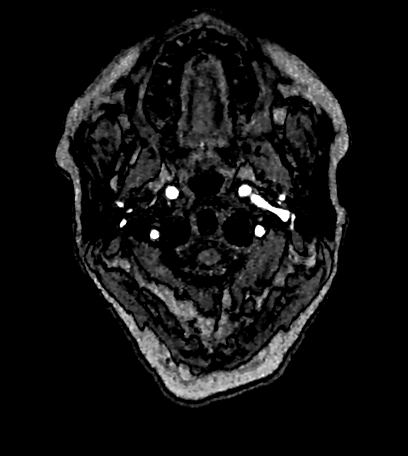
[im 11/243]
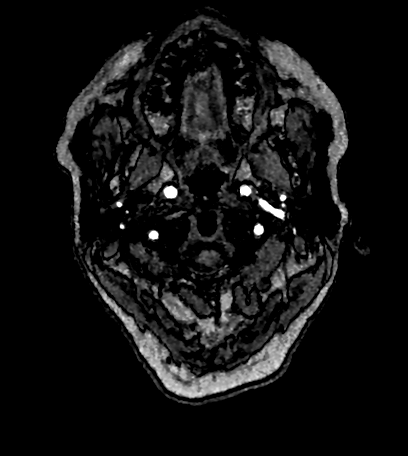
[im 16/243]
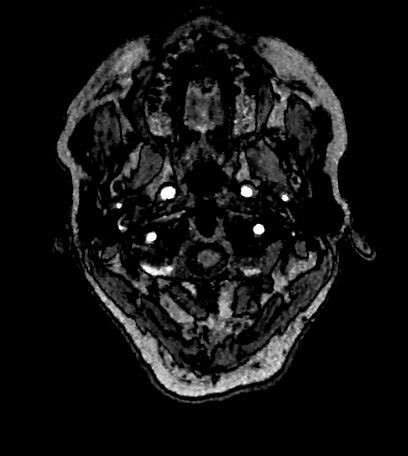
[im 21/243]
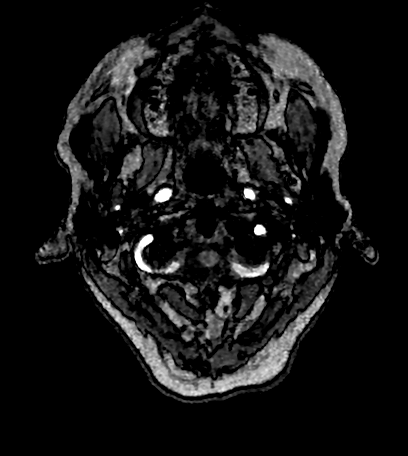
[im 26/243]
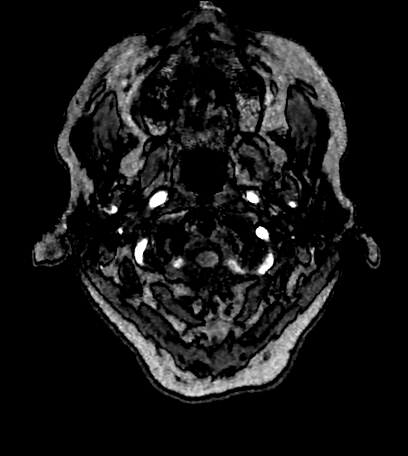
[im 31/243]
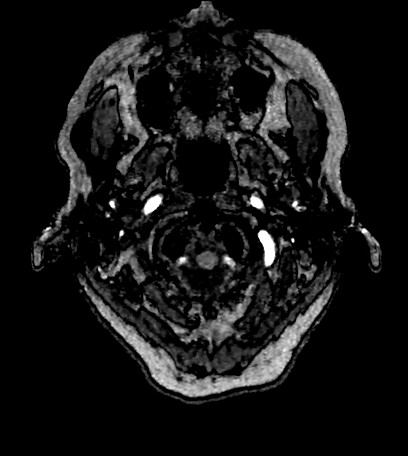
[im 37/243]
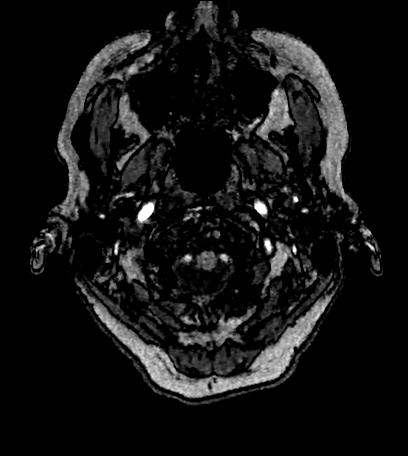
[im 42/243]
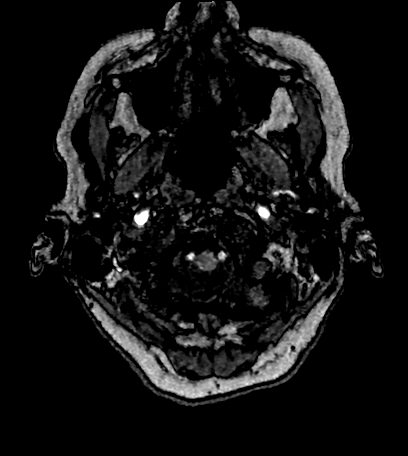
[im 47/243]
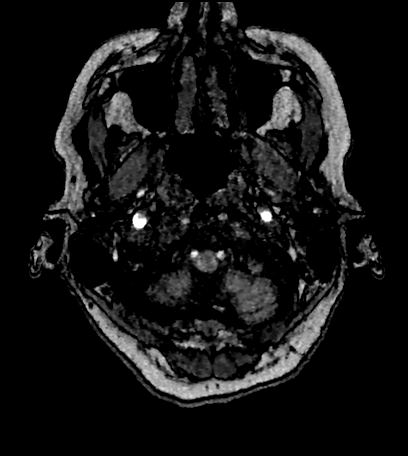
[im 52/243]
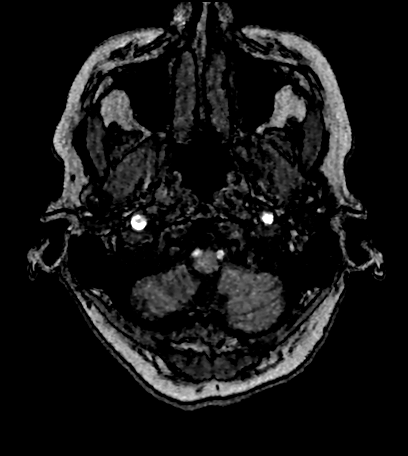
[im 57/243]
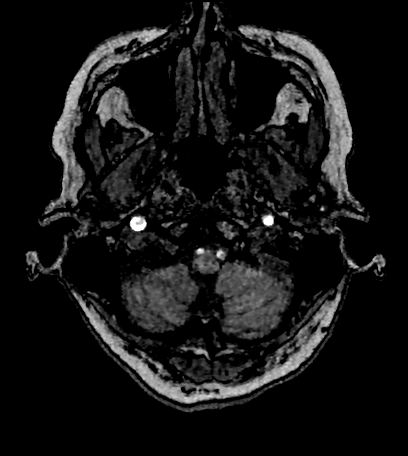
[im 62/243]
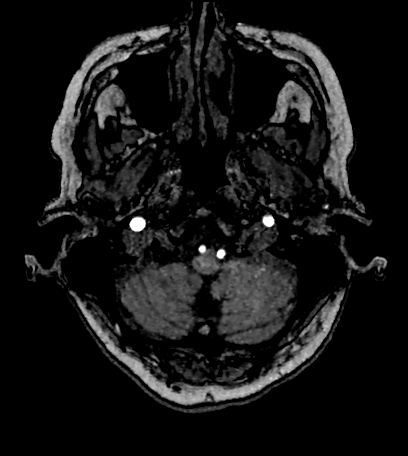
[im 67/243]
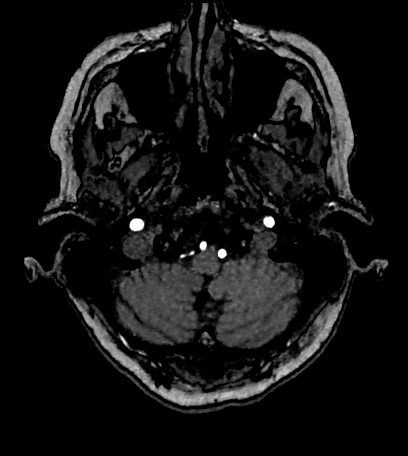
[im 73/243]
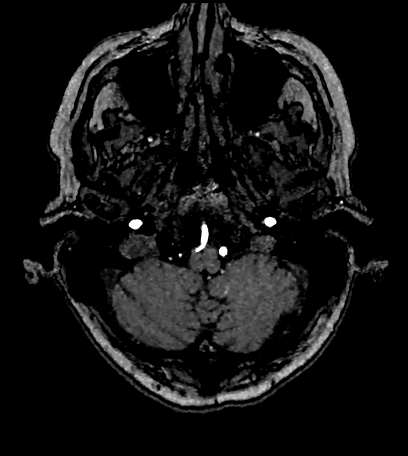
[im 78/243]
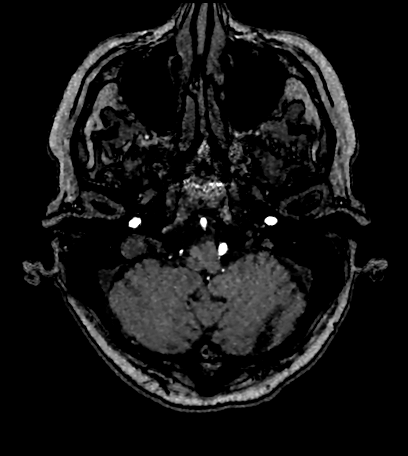
[im 83/243]
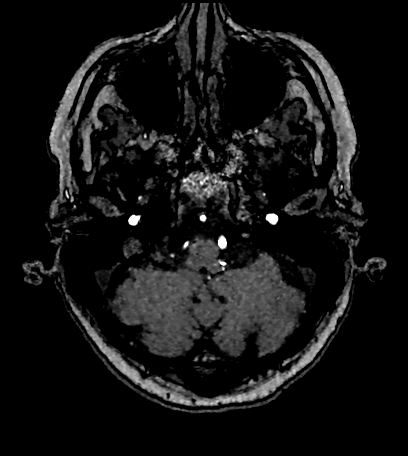
[im 88/243]
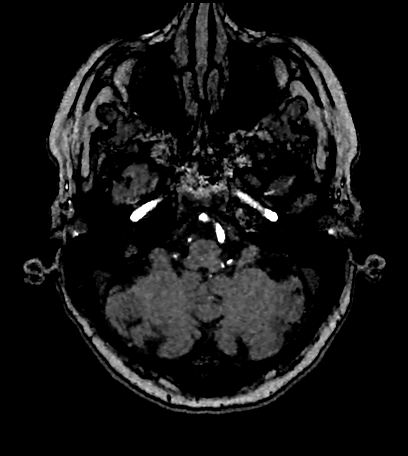
[im 93/243]
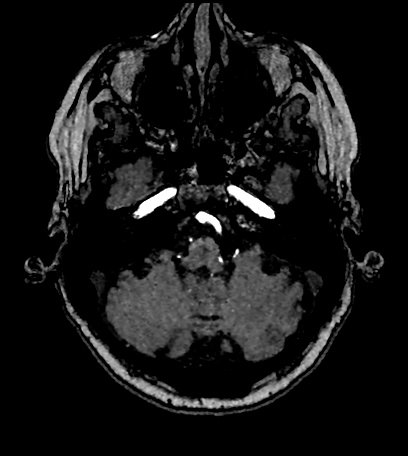
[im 109/243]
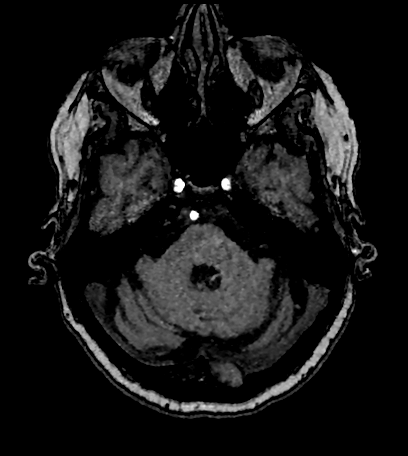
[im 124/243]
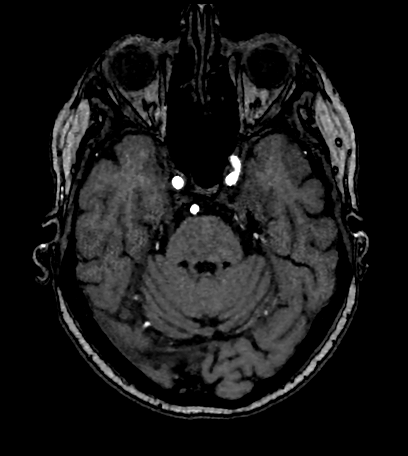
[im 140/243]
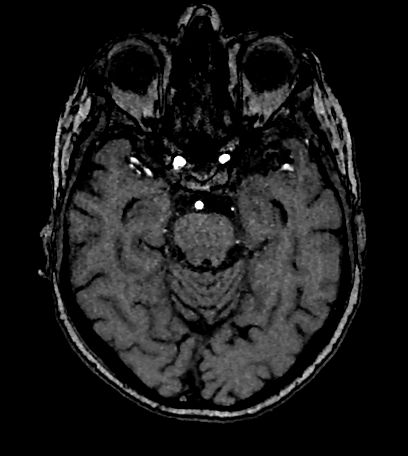
[im 170/243]
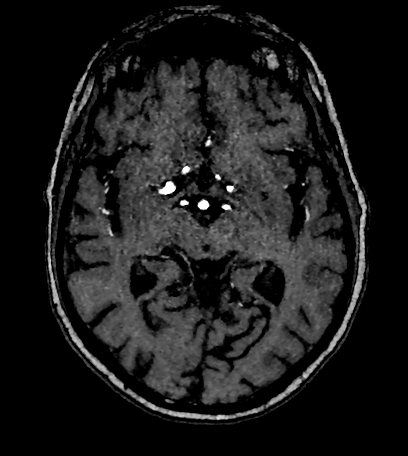
[im 201/243]
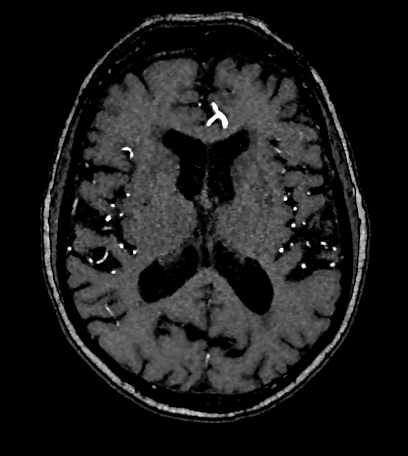
[im 206/243]
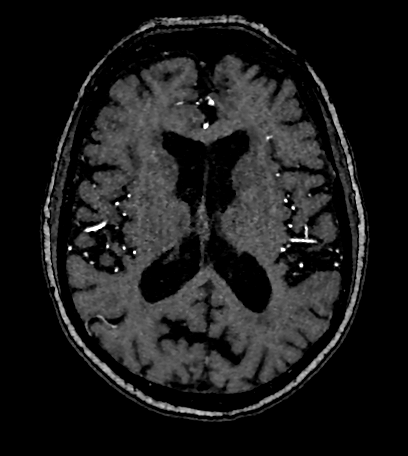
[im 232/243]
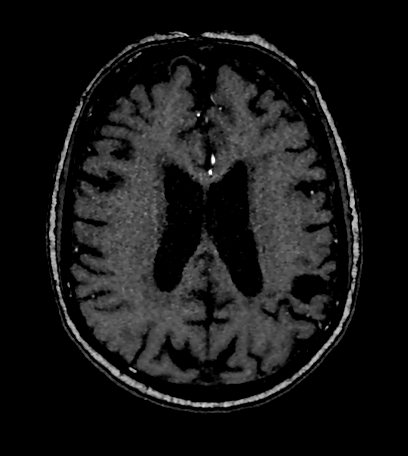

[26 of 48 positions shown; findings below may reference images not displayed]

FINDINGS: MRI HEAD FINDINGS

Brain: Small areas of restricted diffusion in the high left frontal
and left parieto-occipital cortex, suggestive of small acute
infarcts. Mild edema without mass effect. Mild to moderate patchy
T2/FLAIR hyperintensities in the white matter, nonspecific but
compatible with chronic microvascular ischemic disease. No evidence
of acute hemorrhage, hydrocephalus, mass lesion, midline shift, or
extra-axial fluid collection. Cerebral atrophy. Small remote right
cerebellar lacunar infarct.

Vascular: Detailed below.

Skull and upper cervical spine: Normal marrow signal.

Sinuses/Orbits: Clear sinuses.  No acute orbital findings.

Other: No mastoid effusions.

MRA HEAD FINDINGS

Anterior circulation: Bilateral intracranial ICAs, MCAs, and ACAs
are patent without proximal hemodynamically significant stenosis.
Partially Azygos ACA, anatomic variant.

Posterior circulation: Bilateral intradural vertebral arteries,
basilar artery and posterior cerebral arteries are patent without
proximal significant stenosis.

MRA NECK FINDINGS

Motion limited. No evidence of significant (greater than 50%)
stenosis in the visualized carotid systems and vertebral arteries.
The proximal arteries (including the origins) are not imaged.
IMPRESSION: 1. Small areas of restricted diffusion in the high left frontal and
left parieto-occipital cortex, suggestive of small acute infarcts.
Mild edema without mass effect.
2. No evidence of emergent large vessel occlusion or proximal
hemodynamically significant stenosis.

## 2022-04-18 IMAGING — MR MR HEAD W/O CM
12 series · 48 of 48 positions shown · non-contrast
Comparison: CT head from the same day.

CLINICAL DATA: right sided hemianopia, eval cva; Stroke, follow up;
Neuro deficit, acute, stroke suspected



[Series 6: ax dwi_tracew · axial · 3.0mm · 1.44mm/px · z∈[-45,+108]mm · 3 of 42 slices shown]
[im 1/42]
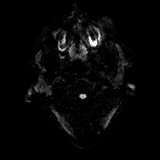
[im 21/42]
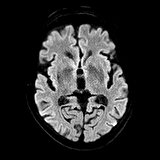
[im 42/42]
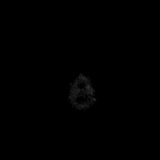

[Series 7: ax dwi_adc · axial · 3.0mm · 1.44mm/px · z∈[-45,+108]mm · 3 of 42 slices shown]
[im 1/42]
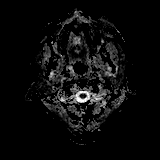
[im 21/42]
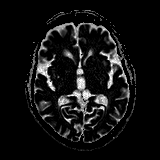
[im 42/42]
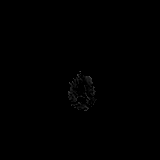

[Series 8: cor dwi_tracew · coronal · 5.0mm · 1.44mm/px · 3 of 39 slices shown]
[im 1/39]
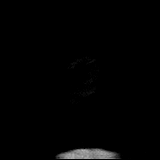
[im 20/39]
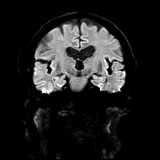
[im 39/39]
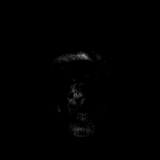

[Series 9: cor dwi_adc · coronal · 5.0mm · 1.44mm/px · 3 of 39 slices shown]
[im 1/39]
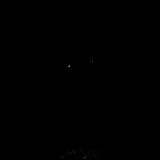
[im 20/39]
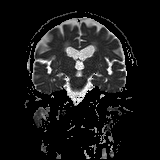
[im 39/39]
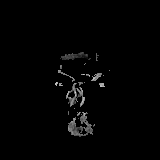

[Series 10: T1 · sagittal · 5.0mm · 0.72mm/px · 2 of 26 slices shown (1 of 2)]
[im 1/26]
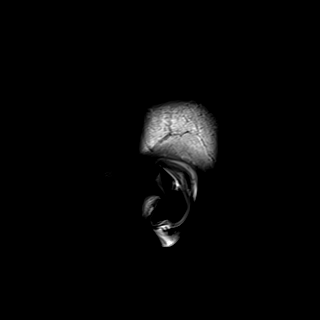
[im 26/26]
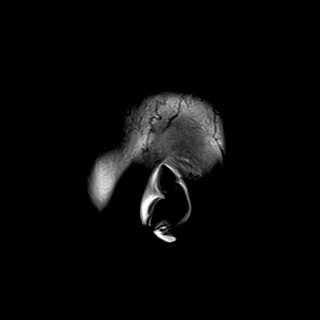

[Series 11: T2 · axial · 5.0mm · 0.53mm/px · z∈[-63,+86]mm · 2 of 26 slices shown (1 of 2)]
[im 1/26]
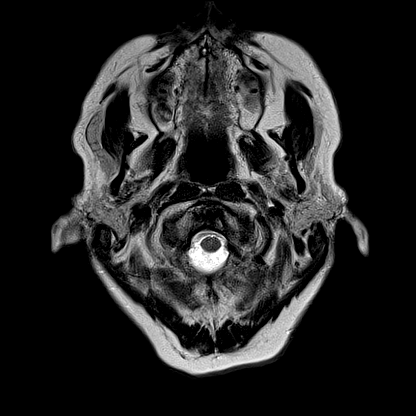
[im 26/26]
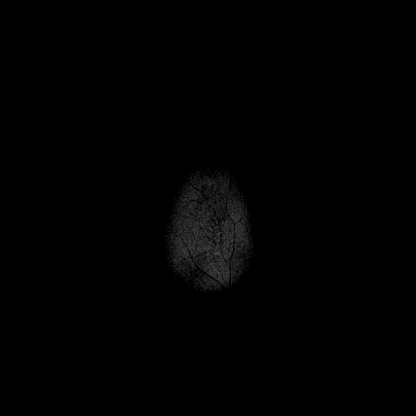

[Series 12: mag_images · axial · 3.0mm · 0.94mm/px · z∈[-77,+99]mm · 5 of 60 slices shown]
[im 1/60]
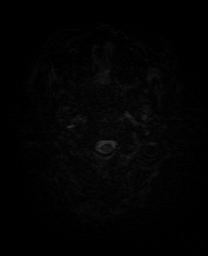
[im 15/60]
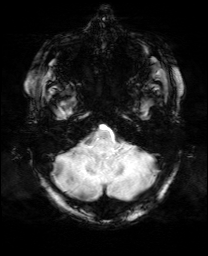
[im 30/60]
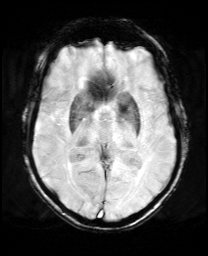
[im 45/60]
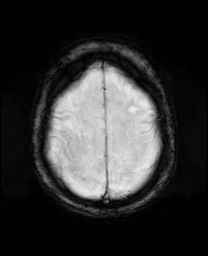
[im 60/60]
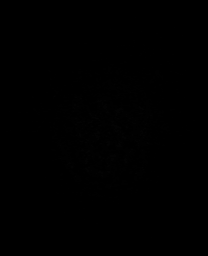

[Series 13: pha_images · axial · 3.0mm · 0.94mm/px · z∈[-74,+99]mm · 5 of 58 slices shown]
[im 1/58]
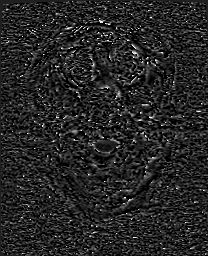
[im 15/58]
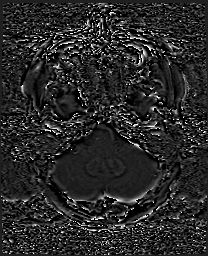
[im 29/58]
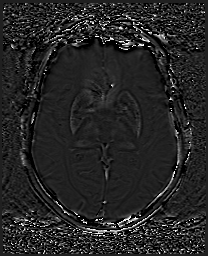
[im 43/58]
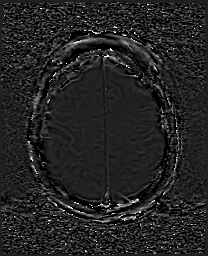
[im 58/58]
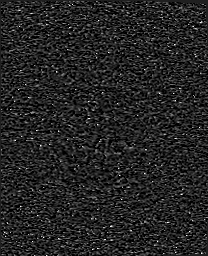

[Series 14: swi_images · axial · 3.0mm · 0.94mm/px · z∈[-77,+99]mm · 5 of 60 slices shown]
[im 1/60]
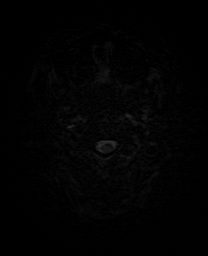
[im 15/60]
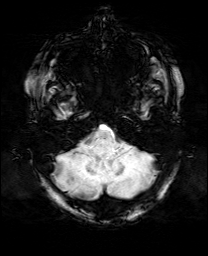
[im 30/60]
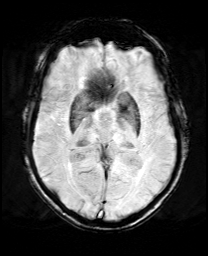
[im 45/60]
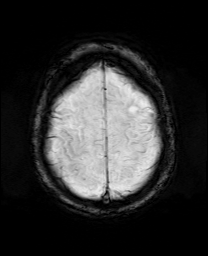
[im 60/60]
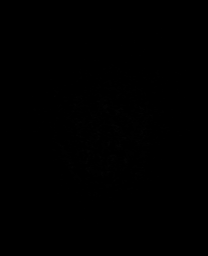

[Series 16: FLAIR · axial · 3.0mm · 0.47mm/px · z∈[-64,+86]mm · 2 of 26 slices shown]
[im 1/26]
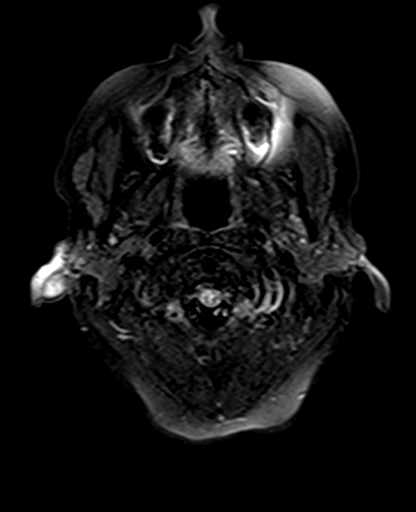
[im 26/26]
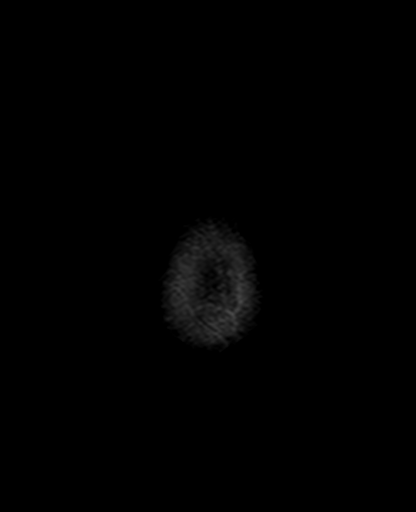

[Series 17: T1 · axial · 1.0mm · 0.98mm/px · z∈[-67,+92]mm · 13 of 160 slices shown (2 of 2)]
[im 1/160]
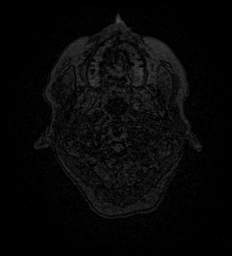
[im 14/160]
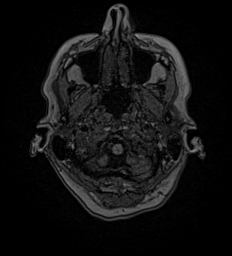
[im 27/160]
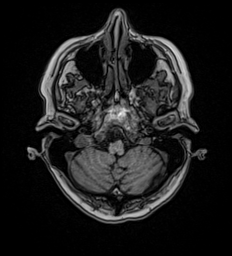
[im 40/160]
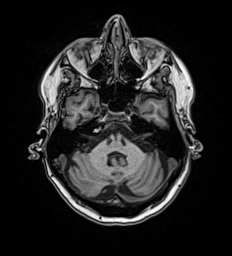
[im 54/160]
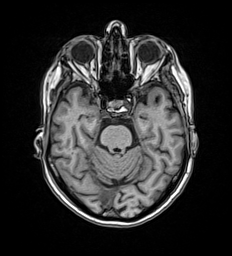
[im 67/160]
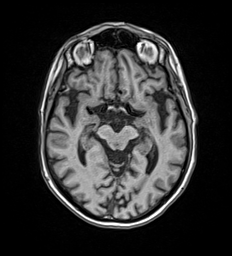
[im 80/160]
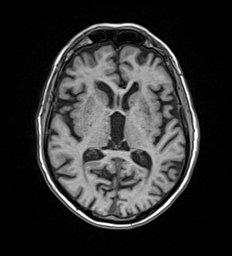
[im 93/160]
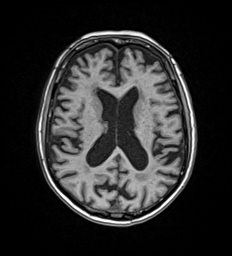
[im 107/160]
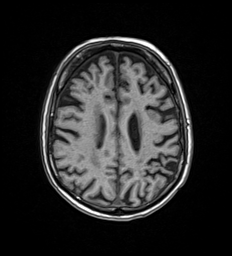
[im 120/160]
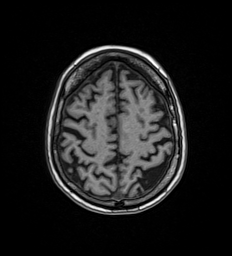
[im 133/160]
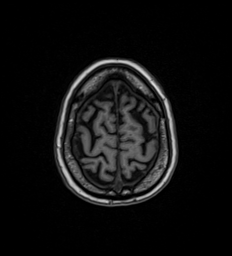
[im 146/160]
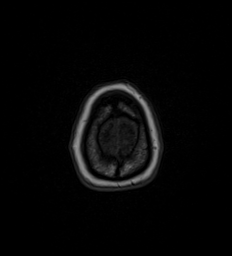
[im 160/160]
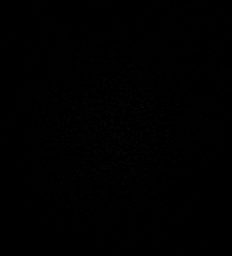

[Series 18: T2 · coronal · 5.0mm · 0.57mm/px · 2 of 28 slices shown (2 of 2)]
[im 1/28]
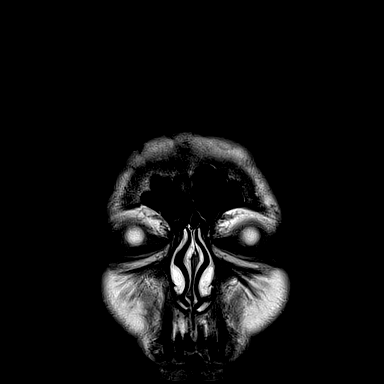
[im 28/28]
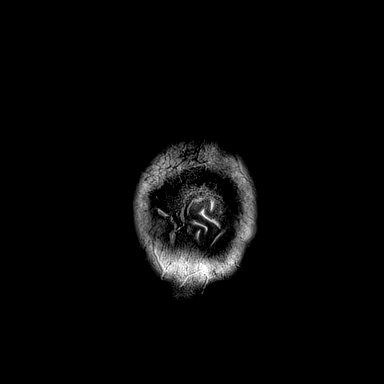

[48 of 48 positions shown; findings below may reference images not displayed]

FINDINGS: MRI HEAD FINDINGS

Brain: Small areas of restricted diffusion in the high left frontal
and left parieto-occipital cortex, suggestive of small acute
infarcts. Mild edema without mass effect. Mild to moderate patchy
T2/FLAIR hyperintensities in the white matter, nonspecific but
compatible with chronic microvascular ischemic disease. No evidence
of acute hemorrhage, hydrocephalus, mass lesion, midline shift, or
extra-axial fluid collection. Cerebral atrophy. Small remote right
cerebellar lacunar infarct.

Vascular: Detailed below.

Skull and upper cervical spine: Normal marrow signal.

Sinuses/Orbits: Clear sinuses.  No acute orbital findings.

Other: No mastoid effusions.

MRA HEAD FINDINGS

Anterior circulation: Bilateral intracranial ICAs, MCAs, and ACAs
are patent without proximal hemodynamically significant stenosis.
Partially Azygos ACA, anatomic variant.

Posterior circulation: Bilateral intradural vertebral arteries,
basilar artery and posterior cerebral arteries are patent without
proximal significant stenosis.

MRA NECK FINDINGS

Motion limited. No evidence of significant (greater than 50%)
stenosis in the visualized carotid systems and vertebral arteries.
The proximal arteries (including the origins) are not imaged.
IMPRESSION: 1. Small areas of restricted diffusion in the high left frontal and
left parieto-occipital cortex, suggestive of small acute infarcts.
Mild edema without mass effect.
2. No evidence of emergent large vessel occlusion or proximal
hemodynamically significant stenosis.

## 2022-04-18 IMAGING — CT CT HEAD CODE STROKE
4 series · 16 of 47 positions shown, 18 images · non-contrast
Comparison: None Available.

CLINICAL DATA: Code stroke. NEURO DEFICIT, ACUTE, STROKE SUSPECTED.
RIGHT-SIDED PERIPHERAL VISION CHANGE.



[Series 3: head wo · axial · 0.45mm/px · z∈[+223,+343]mm · 7 of 33 slices shown, 9 images]
[im 5/33  brain]
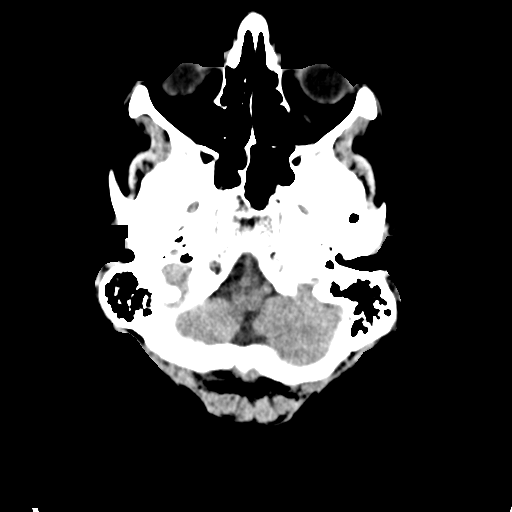
[im 5/33  bone]
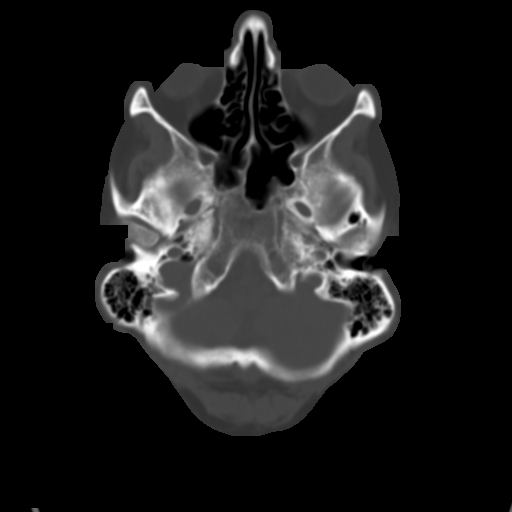
[im 9/33  brain]
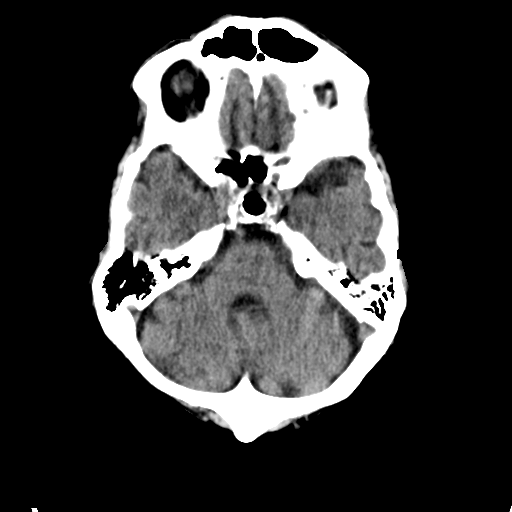
[im 13/33  brain]
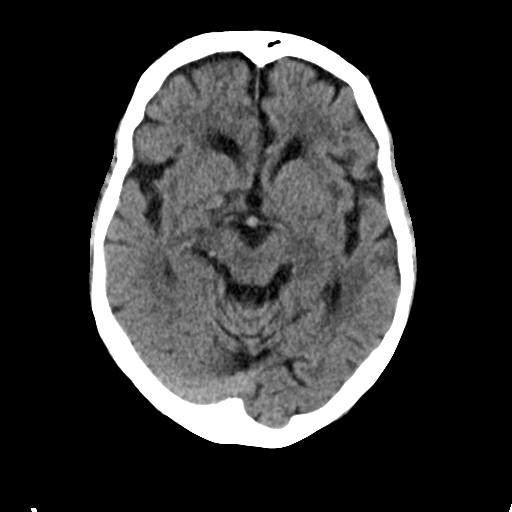
[im 17/33  brain]
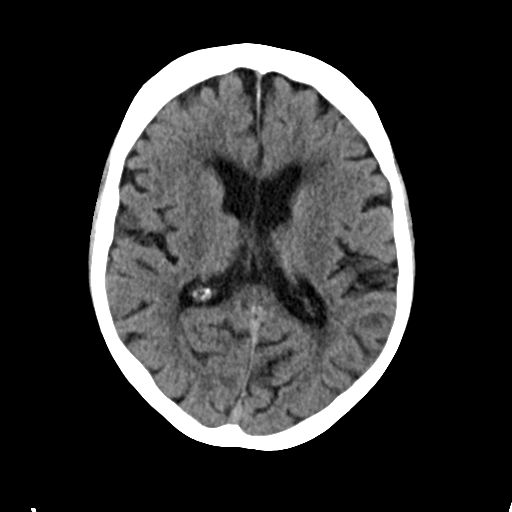
[im 21/33  brain]
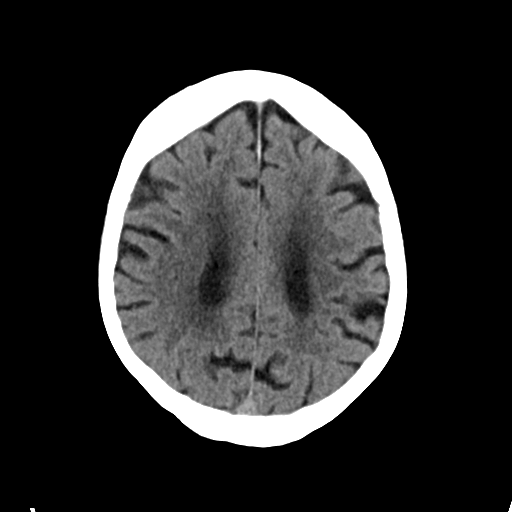
[im 21/33  bone]
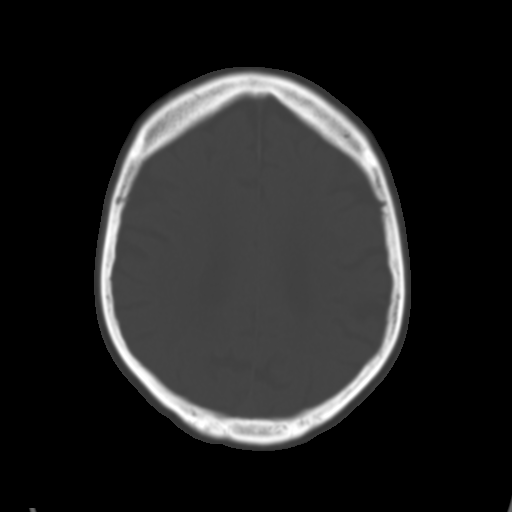
[im 25/33  brain]
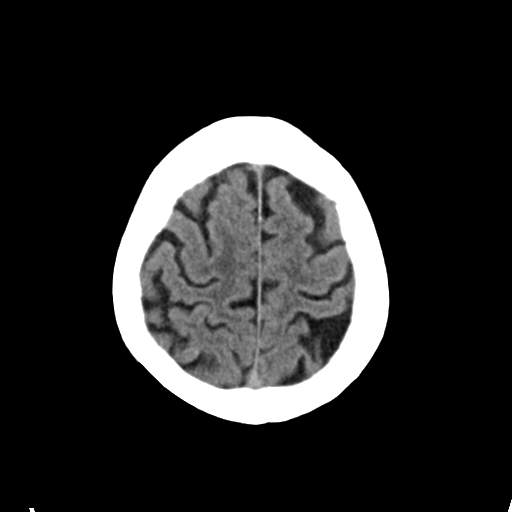
[im 29/33  brain]
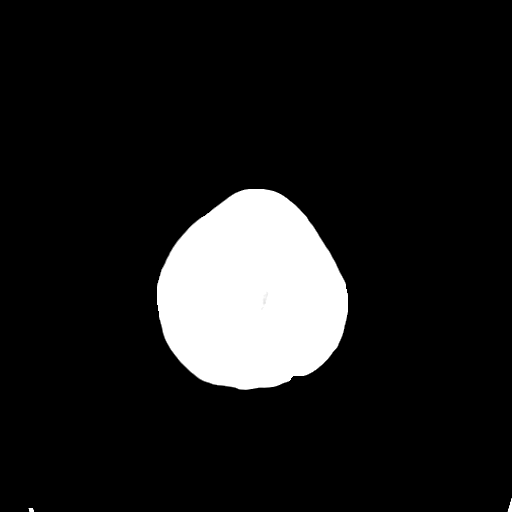

[Series 4: head bone · axial · 0.45mm/px · z∈[+219,+251]mm · 3 of 81 slices shown]
[im 9/81  bone]
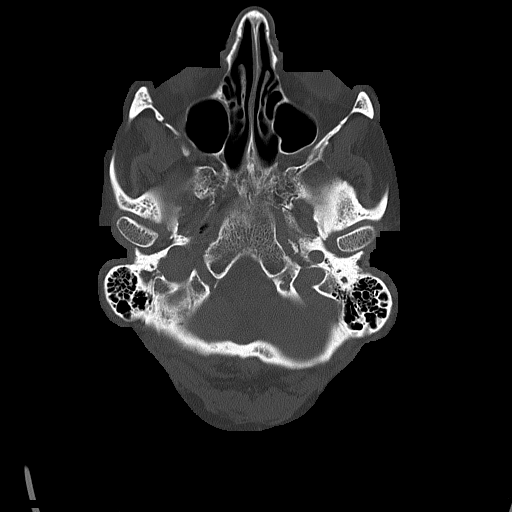
[im 17/81  bone]
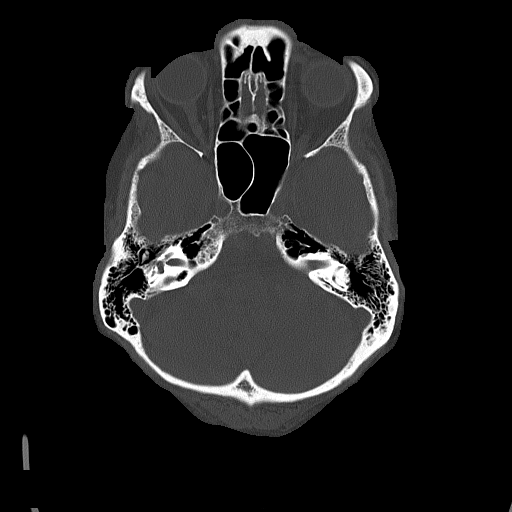
[im 25/81  bone]
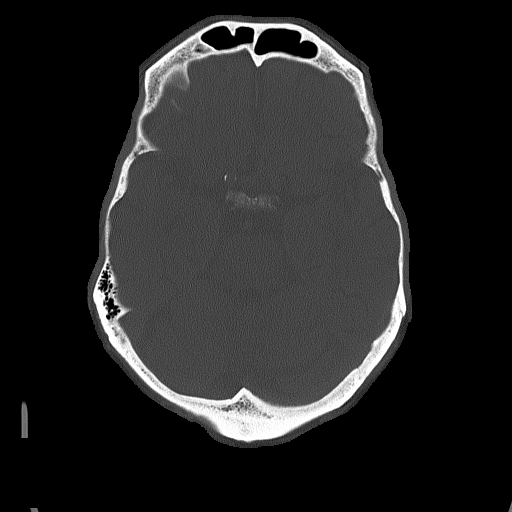

[Series 5: coronal soft tissue · coronal · 0.32mm/px · 3 of 69 slices shown]
[im 23/69  brain]
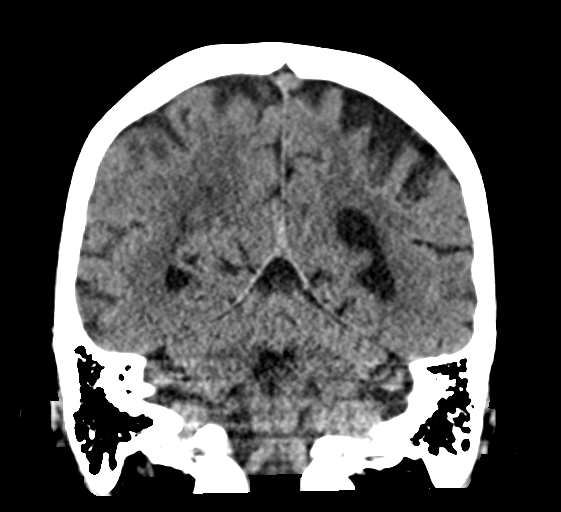
[im 31/69  brain]
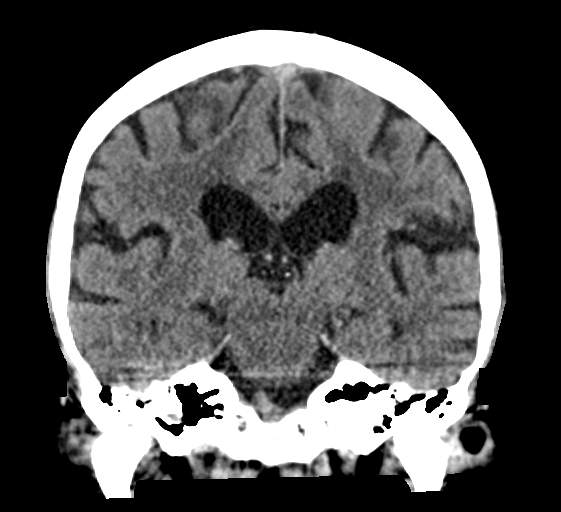
[im 38/69  brain]
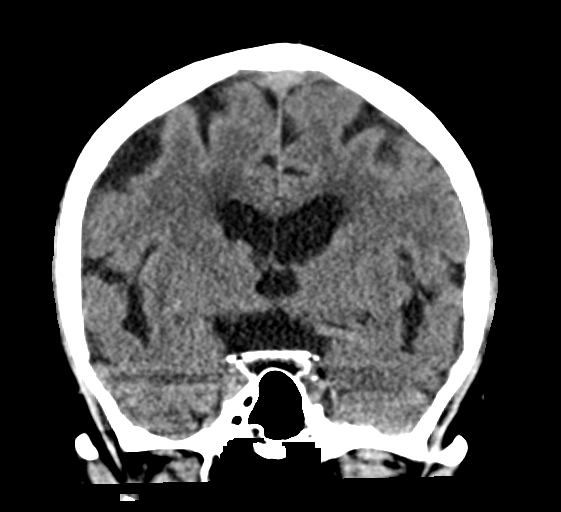

[Series 6: sagittal soft tissue · sagittal · 0.32mm/px · 3 of 59 slices shown]
[im 20/59  brain]
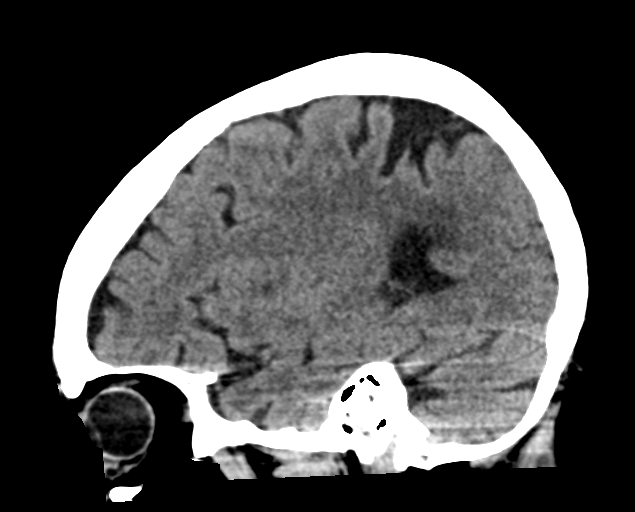
[im 30/59  brain]
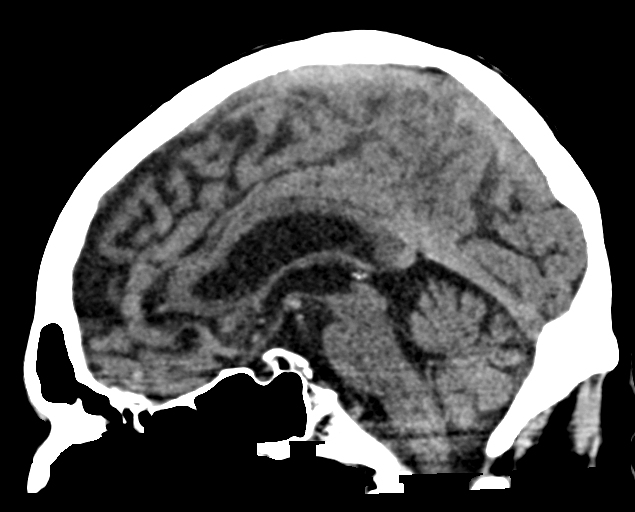
[im 39/59  brain]
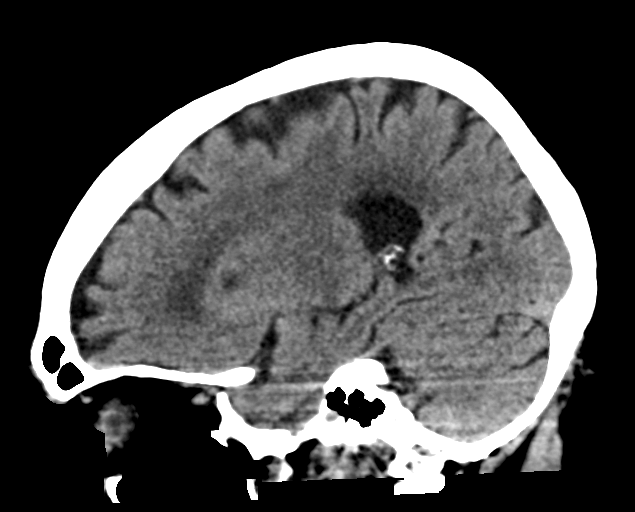

[16 of 47 positions shown; findings below may reference images not displayed]

FINDINGS: Brain: Moderate atrophy and white matter changes are present
bilaterally. Focal hypoattenuation in the anterior limb of the right
internal capsule is likely remote. A remote left parietal lobe
infarct is noted. No other acute or focal cortical abnormality is
present. Basal ganglia are intact. Insular ribbon is normal.
Brainstem and cerebellum are unremarkable.

The ventricles are of normal size. No significant extraaxial fluid
collection is present.

Vascular: Atherosclerotic calcifications are present within the
cavernous internal carotid arteries bilaterally.

Skull: Calvarium is intact. No focal lytic or blastic lesions are
present. No significant extracranial soft tissue lesion is present.

Sinuses/Orbits: The paranasal sinuses and mastoid air cells are
clear. Bilateral lens replacements are noted. Globes and orbits are
otherwise unremarkable.

ASPECTS (Alberta Stroke Program Early CT Score)

- Ganglionic level infarction (caudate, lentiform nuclei, internal
capsule, insula, M1-M3 cortex): [DATE]

- Supraganglionic infarction (M4-M6 cortex): [DATE]

Total score (0-10 with 10 being normal): [DATE]
IMPRESSION: 1. Moderate atrophy and white matter disease likely reflects the
sequela of chronic microvascular ischemia.
2. Remote left parietal lobe infarct.
3. No acute intracranial abnormality.
4. Aspects [DATE]

## 2022-04-18 IMAGING — MR MR MRA NECK W/O CM
1 series · 38 of 48 positions shown · non-contrast
Comparison: CT head from the same day.

CLINICAL DATA: right sided hemianopia, eval cva; Stroke, follow up;
Neuro deficit, acute, stroke suspected



[Series 5: TOF · axial · 0.6mm · 0.52mm/px · z∈[-177,-72]mm · 38 of 185 slices shown]
[im 1/185]
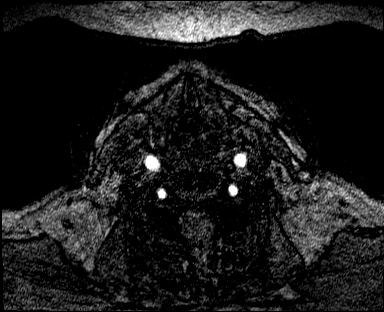
[im 4/185]
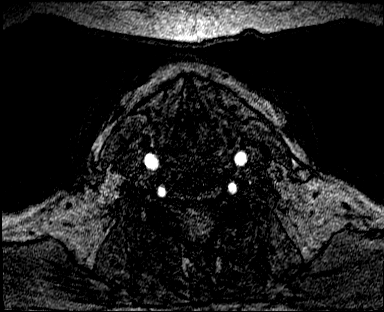
[im 8/185]
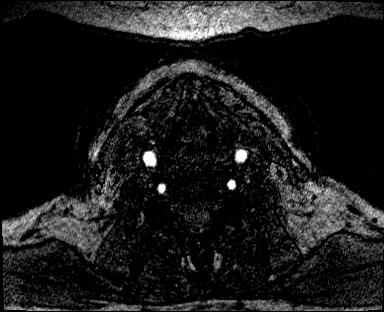
[im 12/185]
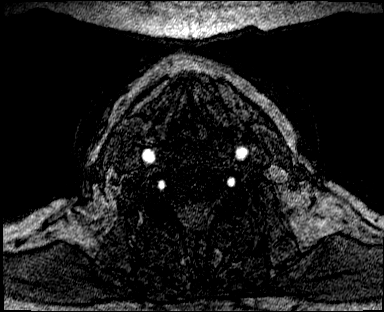
[im 16/185]
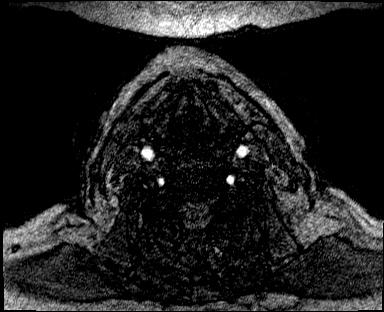
[im 20/185]
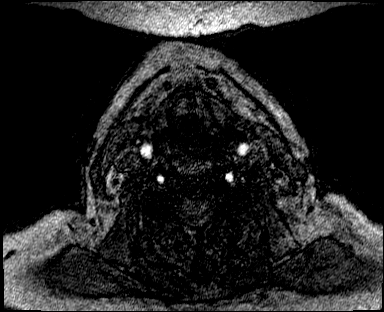
[im 24/185]
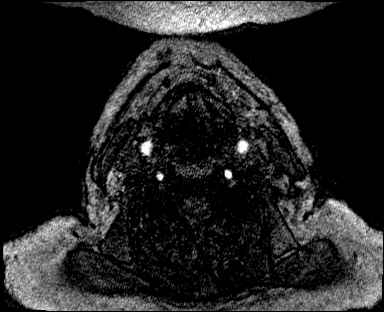
[im 28/185]
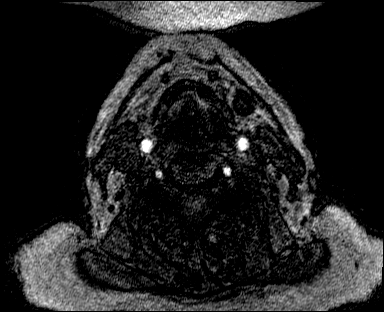
[im 32/185]
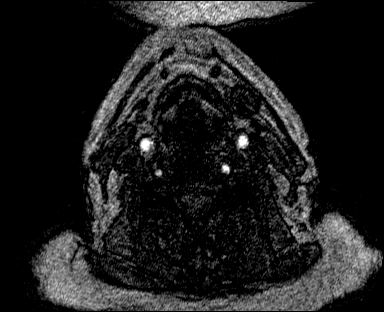
[im 36/185]
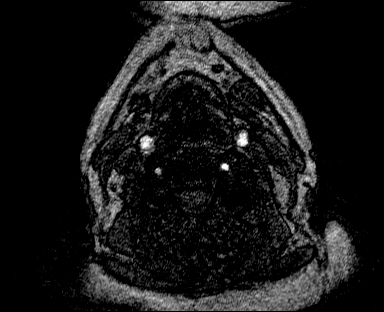
[im 40/185]
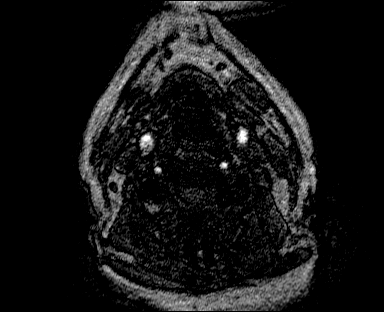
[im 44/185]
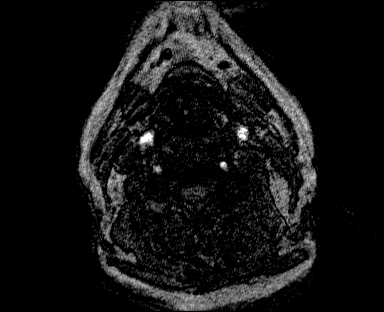
[im 47/185]
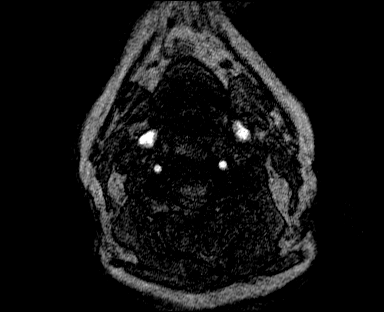
[im 51/185]
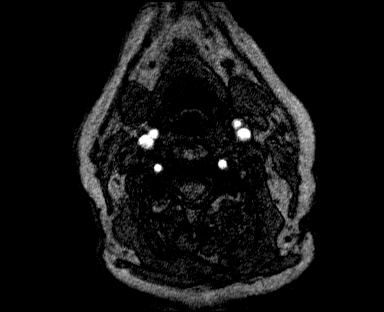
[im 55/185]
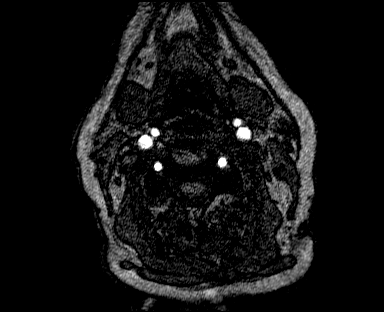
[im 59/185]
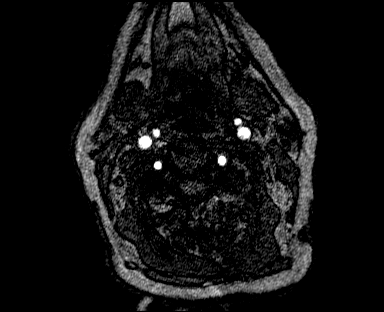
[im 63/185]
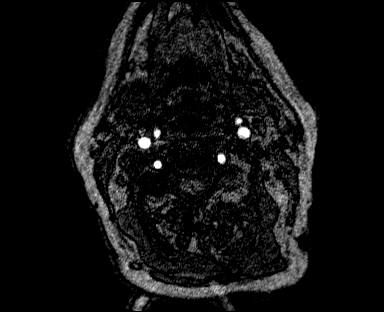
[im 67/185]
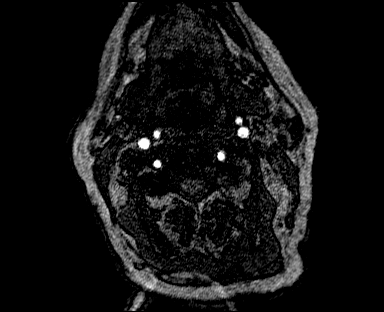
[im 71/185]
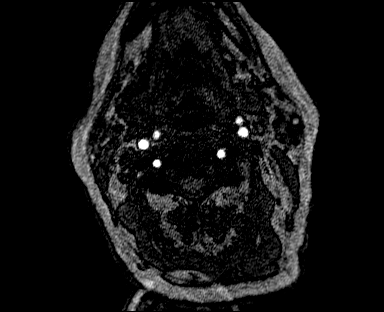
[im 75/185]
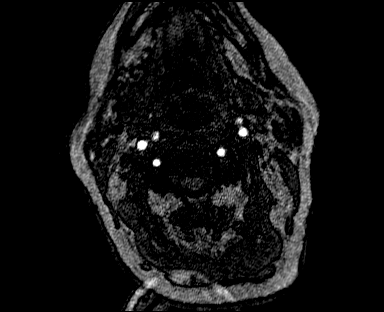
[im 79/185]
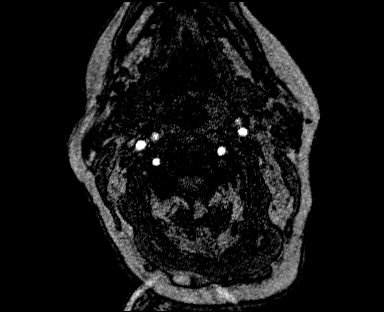
[im 83/185]
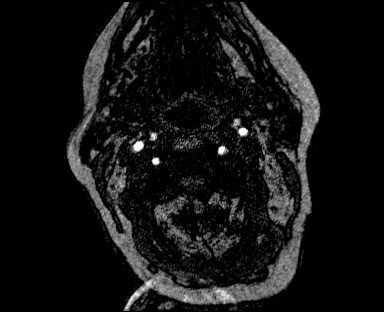
[im 87/185]
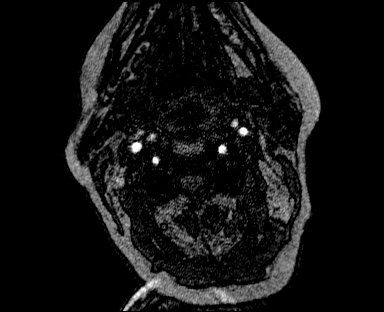
[im 91/185]
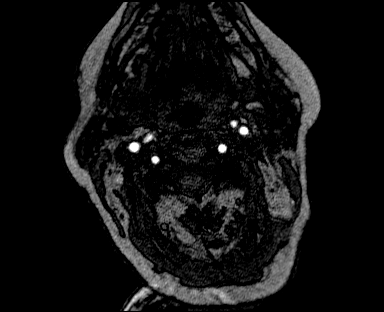
[im 94/185]
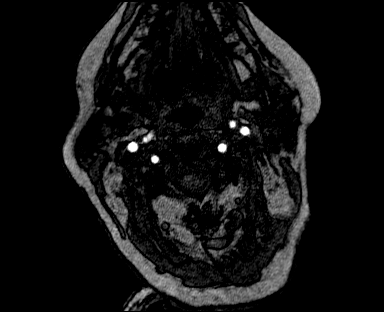
[im 98/185]
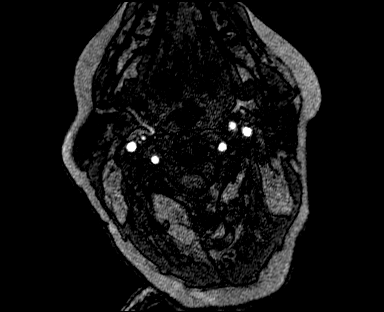
[im 102/185]
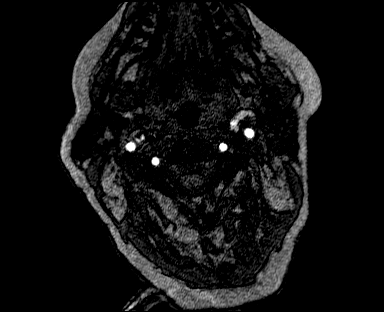
[im 106/185]
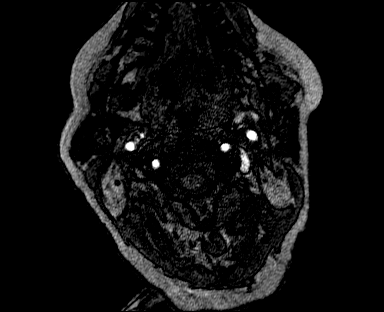
[im 110/185]
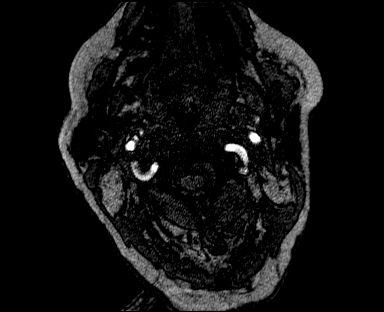
[im 114/185]
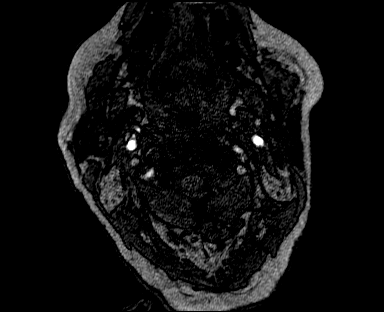
[im 118/185]
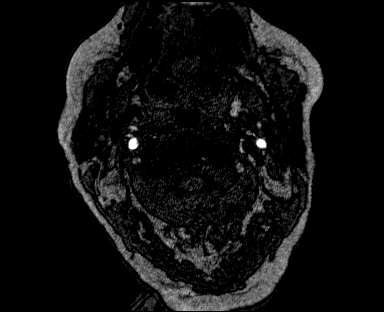
[im 122/185]
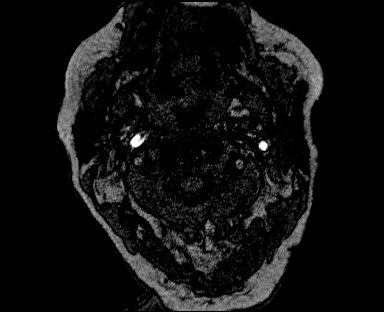
[im 126/185]
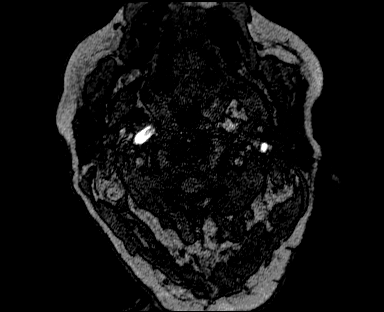
[im 130/185]
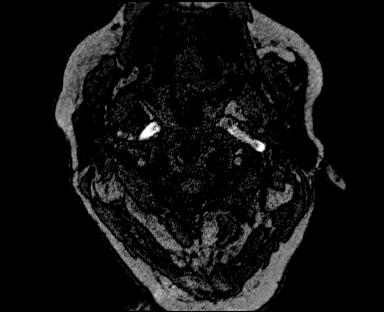
[im 134/185]
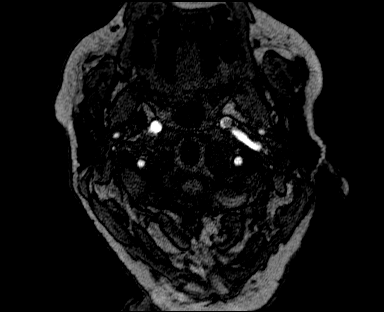
[im 153/185]
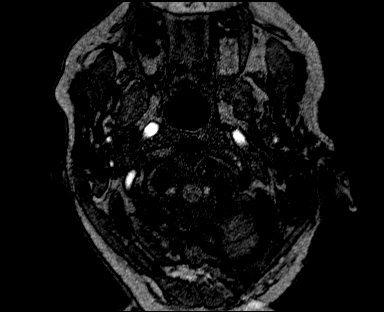
[im 157/185]
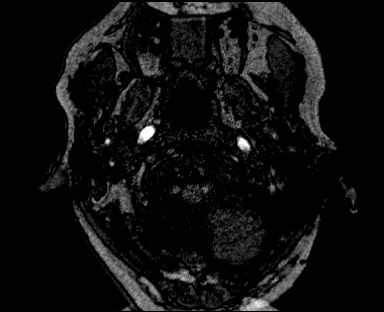
[im 177/185]
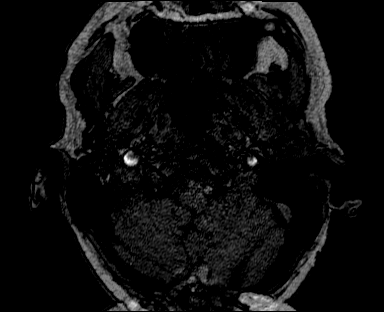

[38 of 48 positions shown; findings below may reference images not displayed]

FINDINGS: MRI HEAD FINDINGS

Brain: Small areas of restricted diffusion in the high left frontal
and left parieto-occipital cortex, suggestive of small acute
infarcts. Mild edema without mass effect. Mild to moderate patchy
T2/FLAIR hyperintensities in the white matter, nonspecific but
compatible with chronic microvascular ischemic disease. No evidence
of acute hemorrhage, hydrocephalus, mass lesion, midline shift, or
extra-axial fluid collection. Cerebral atrophy. Small remote right
cerebellar lacunar infarct.

Vascular: Detailed below.

Skull and upper cervical spine: Normal marrow signal.

Sinuses/Orbits: Clear sinuses.  No acute orbital findings.

Other: No mastoid effusions.

MRA HEAD FINDINGS

Anterior circulation: Bilateral intracranial ICAs, MCAs, and ACAs
are patent without proximal hemodynamically significant stenosis.
Partially Azygos ACA, anatomic variant.

Posterior circulation: Bilateral intradural vertebral arteries,
basilar artery and posterior cerebral arteries are patent without
proximal significant stenosis.

MRA NECK FINDINGS

Motion limited. No evidence of significant (greater than 50%)
stenosis in the visualized carotid systems and vertebral arteries.
The proximal arteries (including the origins) are not imaged.
IMPRESSION: 1. Small areas of restricted diffusion in the high left frontal and
left parieto-occipital cortex, suggestive of small acute infarcts.
Mild edema without mass effect.
2. No evidence of emergent large vessel occlusion or proximal
hemodynamically significant stenosis.

## 2022-04-18 MED ORDER — ASPIRIN 81 MG PO CHEW
325.0000 mg | CHEWABLE_TABLET | Freq: Once | ORAL | Status: AC
  Administered 2022-04-18: 325 mg via ORAL
  Filled 2022-04-18: qty 5

## 2022-04-18 MED ORDER — PHENOBARBITAL 32.4 MG PO TABS
64.8000 mg | ORAL_TABLET | Freq: Two times a day (BID) | ORAL | Status: DC
  Administered 2022-04-18 – 2022-04-19 (×2): 64.8 mg via ORAL
  Filled 2022-04-18 (×2): qty 2

## 2022-04-18 MED ORDER — CYANOCOBALAMIN 500 MCG PO TABS
500.0000 ug | ORAL_TABLET | Freq: Every day | ORAL | Status: DC
  Administered 2022-04-19: 500 ug via ORAL
  Filled 2022-04-18: qty 1

## 2022-04-18 MED ORDER — ACETAMINOPHEN 325 MG PO TABS
650.0000 mg | ORAL_TABLET | Freq: Three times a day (TID) | ORAL | Status: DC | PRN
  Administered 2022-04-19: 650 mg via ORAL
  Filled 2022-04-18: qty 2

## 2022-04-18 MED ORDER — ASPIRIN 81 MG PO TBEC
81.0000 mg | DELAYED_RELEASE_TABLET | Freq: Every day | ORAL | Status: DC
Start: 2022-04-18 — End: 2022-04-18

## 2022-04-18 MED ORDER — PRAVASTATIN SODIUM 20 MG PO TABS
40.0000 mg | ORAL_TABLET | Freq: Once | ORAL | Status: AC
  Administered 2022-04-18: 40 mg via ORAL
  Filled 2022-04-18: qty 2

## 2022-04-18 MED ORDER — FERROUS SULFATE 325 (65 FE) MG PO TABS
325.0000 mg | ORAL_TABLET | ORAL | Status: DC
  Administered 2022-04-19: 325 mg via ORAL
  Filled 2022-04-18: qty 1

## 2022-04-18 MED ORDER — VITAMIN D (ERGOCALCIFEROL) 1.25 MG (50000 UNIT) PO CAPS: 50000.0000 [IU] | ORAL_CAPSULE | ORAL | Status: DC

## 2022-04-18 MED ORDER — SODIUM CHLORIDE 0.9% FLUSH: 3.0000 mL | Freq: Once | INTRAVENOUS | Status: DC

## 2022-04-18 MED ORDER — PRAVASTATIN SODIUM 20 MG PO TABS
80.0000 mg | ORAL_TABLET | Freq: Every day | ORAL | Status: DC
  Administered 2022-04-19: 80 mg via ORAL
  Filled 2022-04-18: qty 4

## 2022-04-18 MED ORDER — STROKE: EARLY STAGES OF RECOVERY BOOK: Freq: Once | Status: AC

## 2022-04-18 MED ORDER — ASPIRIN 81 MG PO TBEC
81.0000 mg | DELAYED_RELEASE_TABLET | Freq: Every day | ORAL | Status: DC
  Administered 2022-04-19: 81 mg via ORAL
  Filled 2022-04-18: qty 1
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
## 2022-04-18 NOTE — Assessment & Plan Note (Addendum)
Patient presents for evaluation of defect in his peripheral vision and is found to have small acute infarcts in the high left frontal and left parieto-occipital cortex on MRI. Given echo findings, suspicious for cardiac thrombus.  In addition, have not yet moved out of atrial fibrillation.  Plan will be for patient to start on Eliquis in 1 week (waiting due to strokes).  Cover with aspirin 81 mg x 7 days.  Then stop aspirin and start Eliquis 5 mg twice daily.  Consideration for 30-day event monitor.  Patient has a history of leg cramps on atorvastatin, so discharging on Crestor 20 mg which is high intensity but least likely to cause cramping.  Cleared by PT and OT and speech.  By following day, vision much improved.

## 2022-04-18 NOTE — ED Notes (Signed)
Pt. And neuro at bedside. Dr. Amada Jupiter neuro at bedside, states no treatment will be needed. ?

## 2022-04-18 NOTE — ED Notes (Signed)
Pt. To MRI
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
## 2022-04-18 NOTE — Assessment & Plan Note (Signed)
H&H is stable ?Continue iron sulfate ?

## 2022-04-18 NOTE — Assessment & Plan Note (Signed)
Stable ?Place patient on seizure precautions ?Continue phenobarbital ?

## 2022-04-18 NOTE — Progress Notes (Signed)
?   04/18/22 1500  ?Clinical Encounter Type  ?Visited With Patient and family together  ?Visit Type Initial;Code  ? ?Chaplain responded to code Stroke and provided support through compassionate presence and conversation. ?
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
## 2022-04-18 NOTE — ED Notes (Addendum)
Leana Roe, RN on teleneurology in room ?

## 2022-04-18 NOTE — Progress Notes (Signed)
Code stroke activated per elert @1447 , page sent to neuro @1452  per [facility], Dr with patient on arrival back to ER @1458 , no TNK, LKW last evening.   ?

## 2022-04-18 NOTE — H&P (Signed)
?History and Physical  ? ? ?Patient: Mitchell Barnes MVH:[id number] DOB: 16-Dec-1945 ?DOA: 04/18/2022 ?DOS: the patient was seen and examined on 04/18/2022 ?PCP: Lorenda Ishihara, MD  ?Patient coming from: Home ? ?Chief Complaint:  ?Chief Complaint  ?Patient presents with  ? Visual Field Change  ? ?HPI: Mitchell Barnes is a 76 y.o. male with medical history significant for seizure disorder, dyslipidemia and anemia who presents to the ER via private vehicle for evaluation of visual field defects.  Patient states that he was in his usual state of health until about noon when he was driving to a restaurant with his wife for lunch.  His wife notes that he was making large turns and missing carbs which was unusual for him.  Patient also noted right-sided peripheral vision change and while at lunch was having difficulty eating his food because he could not see his fork on the right side. ?He denies having any headache, no difficulty swallowing, no blurred vision, no dizziness, no lightheadedness, no double vision, no focal deficits, no falls, no chest pain, no shortness of breath, no nausea, no vomiting, no leg swelling, no changes in his bowel habits or urinary symptoms. ?Code stroke was activated when he arrived to triage.  Last known well was the evening the day prior to presentation.  Patient was seen by neurology and was not a candidate for tPA. ?He will be admitted to the hospital for further evaluation. ? ? ? ? ?Review of Systems: As mentioned in the history of present illness. All other systems reviewed and are negative. ?Past Medical History:  ?Diagnosis Date  ? Anemia   ? HLD (hyperlipidemia)   ? Seizures (HCC)   ? ?Past Surgical History:  ?Procedure Laterality Date  ? EYE SURGERY    ? ?Social History:  reports that he has never smoked. He has never used smokeless tobacco. He reports current alcohol use. He reports that he does not use drugs. ? ?Allergies  ?Allergen Reactions  ? Atorvastatin Other  (See Comments)  ?  Muscle cramps  ? Dilantin [Phenytoin] Itching  ?  Body turns red  ? ? ?Family History  ?Problem Relation Age of Onset  ? Heart attack Father   ? ? ?Prior to Admission medications   ?Medication Sig Start Date End Date Taking? Authorizing Provider  ?acetaminophen (TYLENOL) 650 MG CR tablet Take 650 mg by mouth every 8 (eight) hours as needed for pain or fever.   Yes [provider]  ?ferrous sulfate 325 (65 FE) MG tablet Take 325 mg by mouth 2 (two) times a week. Monday, Thursday   Yes [provider]  ?PHENobarbital (LUMINAL) 32.4 MG tablet Take 64.8 mg by mouth 2 (two) times daily. 04/11/20  Yes [provider]  ?pravastatin (PRAVACHOL) 40 MG tablet Take 40 mg by mouth daily. 09/03/15  Yes [provider]  ?vitamin B-12 (CYANOCOBALAMIN) 500 MCG tablet Take 500 mcg by mouth daily.   Yes [provider]  ?Vitamin D, Ergocalciferol, (DRISDOL) 1.25 MG (50000 UNIT) CAPS capsule Take 50,000 Units by mouth once a week. 11/29/21  Yes [provider]  ?aspirin EC 81 MG EC tablet Take 1 tablet (81 mg total) by mouth daily. ?Patient not taking: Reported on 04/18/2022 04/17/20   Alford Highland, MD  ?mupirocin ointment (BACTROBAN) 2 % Apply 1 application topically 2 (two) times daily. ?Patient not taking: Reported on 04/18/2022 08/08/21   Mickie Bail, NP  ?pravastatin (PRAVACHOL) 80 MG tablet Take 80 mg by mouth  daily.    [provider]  ? ? ?Physical Exam: ?Vitals:  ? 04/18/22 1515 04/18/22 1530 04/18/22 1545 04/18/22 1730  ?BP:    124/77  ?Pulse: 78 80 71 71  ?Resp: 18 14 16 17   ?Temp:      ?TempSrc:      ?SpO2: 98% 93% 90% 90%  ? ?Physical Exam ?Vitals and nursing note reviewed.  ?Constitutional:   ?   Appearance: Normal appearance.  ?HENT:  ?   Head: Normocephalic and atraumatic.  ?   Nose: Nose normal.  ?   Mouth/Throat:  ?   Mouth: Mucous membranes are moist.  ?Eyes:  ?   Pupils: Pupils are equal, round, and reactive to light.  ?Cardiovascular:   ?   Rate and Rhythm: Normal rate and regular rhythm.  ?Pulmonary:  ?   Effort: Pulmonary effort is normal.  ?   Breath sounds: Normal breath sounds.  ?Abdominal:  ?   General: Abdomen is flat. Bowel sounds are normal.  ?   Palpations: Abdomen is soft.  ?Musculoskeletal:     ?   General: Normal range of motion.  ?   Cervical back: Normal range of motion.  ?Skin: ?   General: Skin is warm and dry.  ?Neurological:  ?   Mental Status: He is alert.  ?   Comments: Peripheral field cut on the right  ?Psychiatric:     ?   Mood and Affect: Mood normal.     ?   Behavior: Behavior normal.  ? ? ?Data Reviewed: ?Relevant notes from primary care and specialist visits, past discharge summaries as available in EHR, including Care Everywhere. ?Prior diagnostic testing as pertinent to current admission diagnoses ?Updated medications and problem lists for reconciliation ?ED course, including vitals, labs, imaging, treatment and response to treatment ?Triage notes, nursing and pharmacy notes and ED provider's notes ?Notable results as noted in HPI ?Labs reviewed.  Sodium 137, potassium 4.3, chloride 105, bicarb 25, glucose 102, BUN 21, creatinine 1.27, calcium 8.9, white count 8.1, hemoglobin 12.3, hematocrit 38.6, platelet count 208 ?CT scan of the head without contrast shows Moderate atrophy and white matter disease likely reflects the ?sequela of chronic microvascular ischemia.Remote left parietal lobe infarct.No acute intracranial abnormality. ?Aspects 10/10 ?MRI of the brain/MRA head/neck shows Small areas of restricted diffusion in the high left frontal and left parieto-occipital cortex, suggestive of small acute infarcts. Mild edema without mass effect. No evidence of emergent large vessel occlusion or proximal hemodynamically significant stenosis. ?Twelve-lead EKG reviewed by me shows sinus rhythm with PACs.  Right bundle branch block and left anterior fascicular block. ?There are no new results to review at this  time. ? ?Assessment and Plan: ?* CVA (cerebral vascular accident) (HCC) ?Patient presents for evaluation of defect in his peripheral vision and is found to have small acute infarcts in the high left frontal and ?left parieto-occipital cortex on MRI. ?Place patient on aspirin and high intensity statins ?Allow for permissive hypertension ?Obtain 2D echocardiogram to assess LVEF and rule out cardiac thrombus ?We will consult PT/OT/ST ?We will request neurology consult ? ? ?Seizure (HCC) ?Stable ?Place patient on seizure precautions ?Continue phenobarbital ? ?Anemia ?H&H is stable ?Continue iron sulfate ? ? ? ? ? Advance Care Planning:   Code Status: Full Code  ? ?Consults: Neurology ? ?Family Communication: Greater than 50% of time was spent discussing patient's condition and plan of care with him and his wife at the bedside.  All questions and concerns  have been addressed.  They verbalized understanding and agree with the plan. ? ?Severity of Illness: ?The appropriate patient status for this patient is OBSERVATION. Observation status is judged to be reasonable and necessary in order to provide the required intensity of service to ensure the patient's safety. The patient's presenting symptoms, physical exam findings, and initial radiographic and laboratory data in the context of their medical condition is felt to place them at decreased risk for further clinical deterioration. Furthermore, it is anticipated that the patient will be medically stable for discharge from the hospital within 2 midnights of admission.  ? ?Author: ?Lucile Shutters, MD ?04/18/2022 6:30 PM ? ?For on call review [email or web address].  ?

## 2022-04-18 NOTE — ED Notes (Signed)
Pt to ED doc, and to CT ?

## 2022-04-19 ENCOUNTER — Encounter: Payer: Self-pay | Admitting: Internal Medicine

## 2022-04-19 ENCOUNTER — Observation Stay: Payer: Medicare Other

## 2022-04-19 ENCOUNTER — Observation Stay: Admit: 2022-04-19 | Payer: Medicare Other

## 2022-04-19 ENCOUNTER — Observation Stay (HOSPITAL_BASED_OUTPATIENT_CLINIC_OR_DEPARTMENT_OTHER)
Admit: 2022-04-19 | Discharge: 2022-04-19 | Disposition: A | Discharge status: Home / Self Care | Payer: Medicare Other | Attending: Internal Medicine | Admitting: Internal Medicine

## 2022-04-19 DIAGNOSIS — I6389 Other cerebral infarction: Secondary | ICD-10-CM | POA: Diagnosis not present

## 2022-04-19 DIAGNOSIS — E782 Mixed hyperlipidemia: Secondary | ICD-10-CM | POA: Diagnosis not present

## 2022-04-19 DIAGNOSIS — I5042 Chronic combined systolic (congestive) and diastolic (congestive) heart failure: Secondary | ICD-10-CM | POA: Diagnosis not present

## 2022-04-19 DIAGNOSIS — I6523 Occlusion and stenosis of bilateral carotid arteries: Secondary | ICD-10-CM | POA: Diagnosis not present

## 2022-04-19 DIAGNOSIS — H539 Unspecified visual disturbance: Secondary | ICD-10-CM | POA: Diagnosis not present

## 2022-04-19 DIAGNOSIS — H5347 Heteronymous bilateral field defects: Secondary | ICD-10-CM | POA: Diagnosis not present

## 2022-04-19 DIAGNOSIS — I63422 Cerebral infarction due to embolism of left anterior cerebral artery: Secondary | ICD-10-CM

## 2022-04-19 DIAGNOSIS — I6503 Occlusion and stenosis of bilateral vertebral arteries: Secondary | ICD-10-CM | POA: Diagnosis not present

## 2022-04-19 DIAGNOSIS — R221 Localized swelling, mass and lump, neck: Secondary | ICD-10-CM | POA: Diagnosis not present

## 2022-04-19 DIAGNOSIS — R569 Unspecified convulsions: Secondary | ICD-10-CM | POA: Diagnosis not present

## 2022-04-19 DIAGNOSIS — Z8673 Personal history of transient ischemic attack (TIA), and cerebral infarction without residual deficits: Secondary | ICD-10-CM | POA: Diagnosis not present

## 2022-04-19 LAB — LIPID PANEL
Cholesterol: 174 mg/dL (ref 0–200)
HDL: 71 mg/dL (ref >40)
LDL Cholesterol: 91 mg/dL (ref 0–99)
Total CHOL/HDL Ratio: 2.5 RATIO
Triglycerides: 61 mg/dL (ref <150)
VLDL: 12 mg/dL (ref 0–40)

## 2022-04-19 LAB — ECHOCARDIOGRAM COMPLETE
AR max vel: 3.36 cm2
AV Area VTI: 3.65 cm2
AV Area mean vel: 2.79 cm2
AV Mean grad: 3 mmHg
AV Peak grad: 5.5 mmHg
Ao pk vel: 1.17 m/s
Area-P 1/2: 3.03 cm2
MV VTI: 2.71 cm2
S' Lateral: 2.85 cm

## 2022-04-19 LAB — HEMOGLOBIN A1C
Hgb A1c MFr Bld: 5.8 % — ABNORMAL HIGH (ref 4.8–5.6)
Mean Plasma Glucose: 119.76 mg/dL

## 2022-04-19 IMAGING — CT CT ANGIO NECK
3 of 7 series · 9 of 34 positions shown · non-contrast
Comparison: MRA neck dated 1 day prior

CLINICAL DATA: Stroke follow-up



[Series 4: cta neck · axial · 0.54mm/px · z∈[-243,-163]mm · 2 of 120 slices shown]
[im 40/120  soft-tissue]
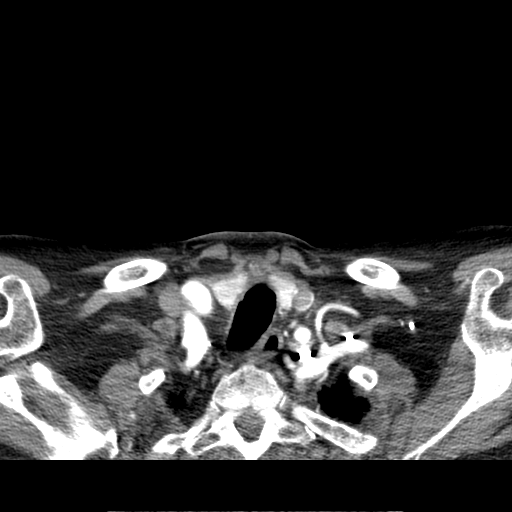
[im 80/120  soft-tissue]
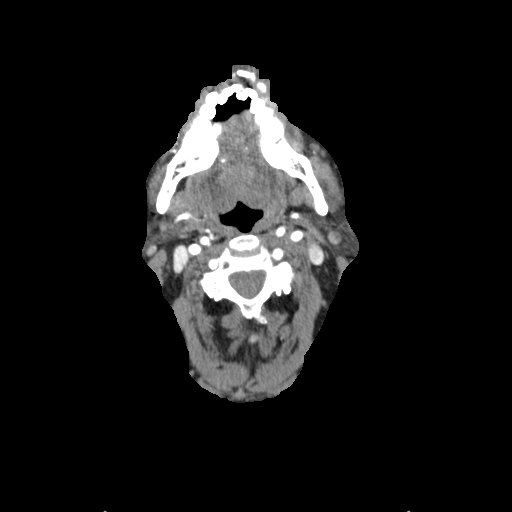

[Series 6: ax thin · axial · 0.30mm/px · z∈[-288,-117]mm · 6 of 241 slices shown]
[im 35/241  soft-tissue]
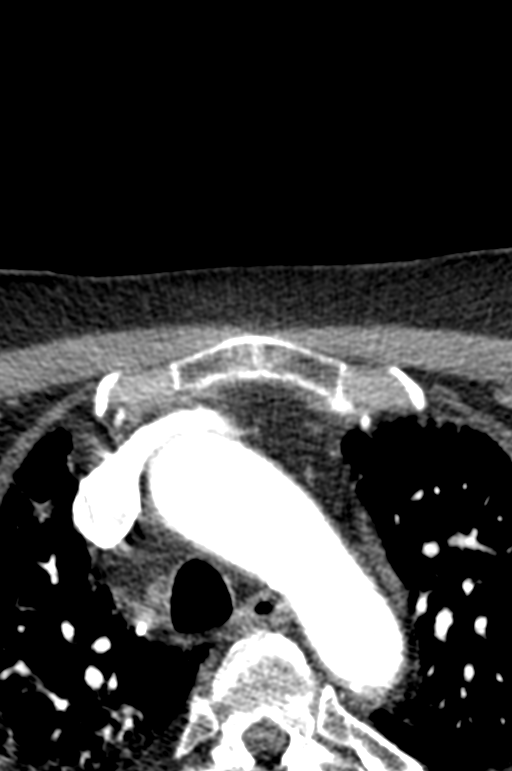
[im 69/241  bone]
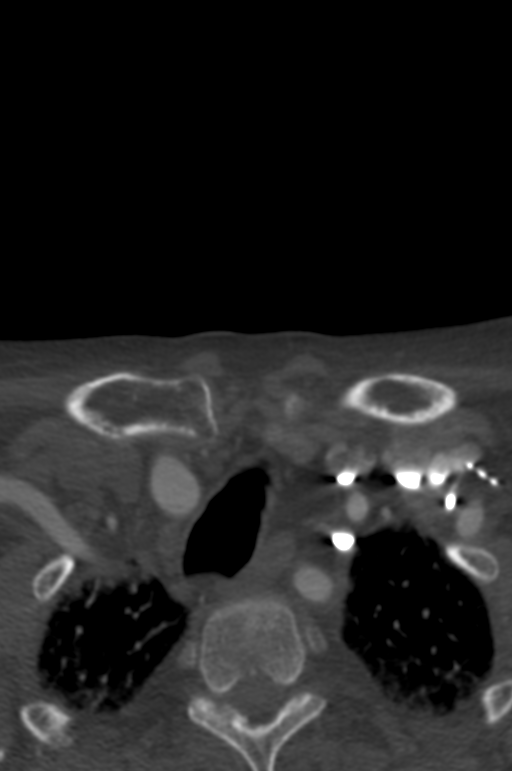
[im 103/241  soft-tissue]
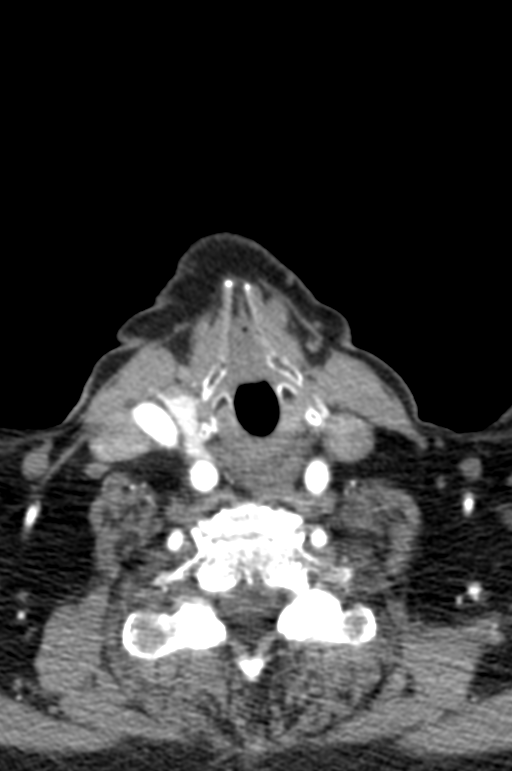
[im 138/241  bone]
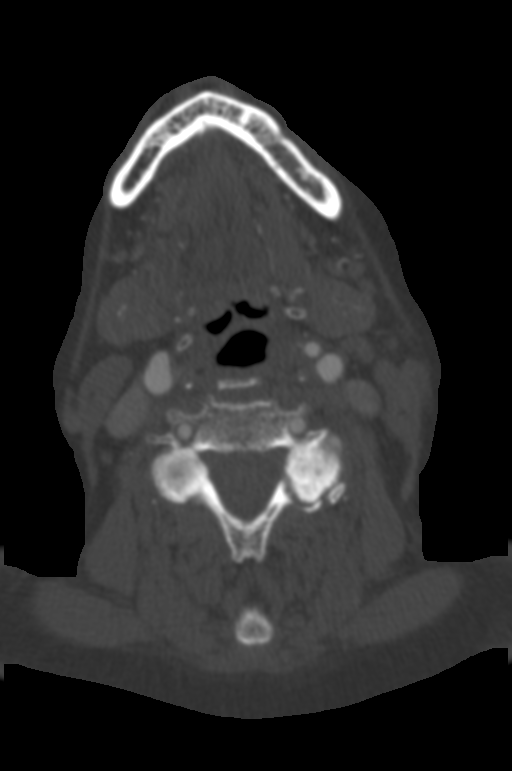
[im 172/241  soft-tissue]
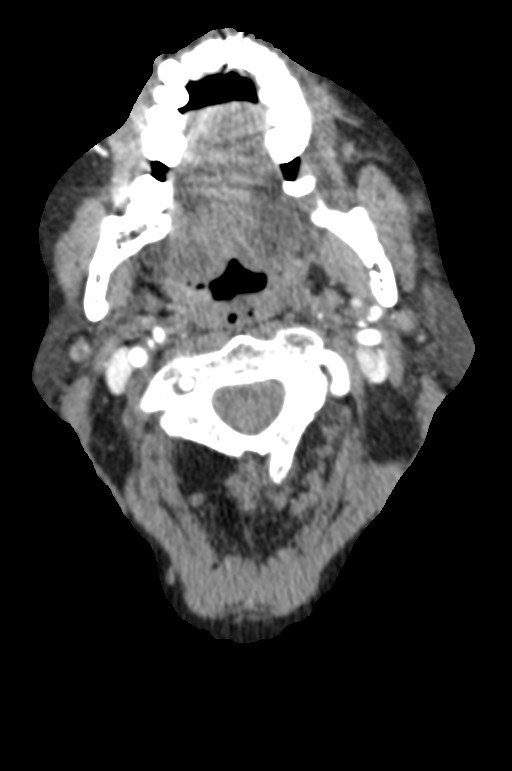
[im 206/241  bone]
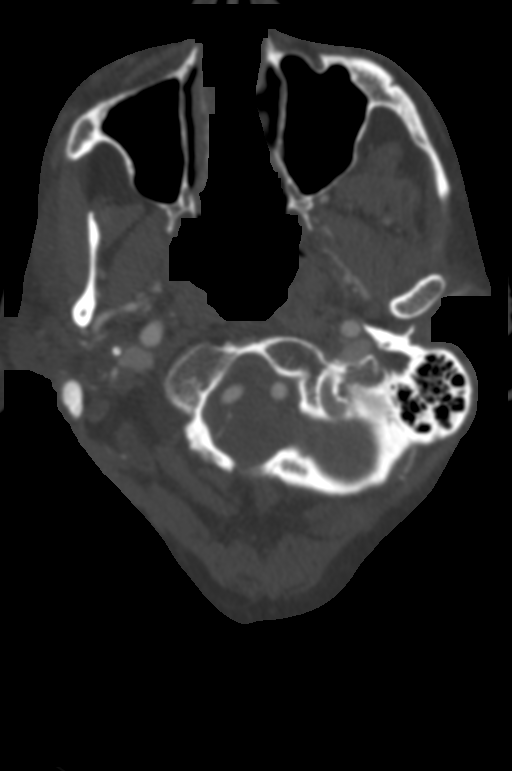

[Series 12: sagittal thin · sagittal · 0.47mm/px · 1 of 184 slices shown]
[im 122/184  soft-tissue]
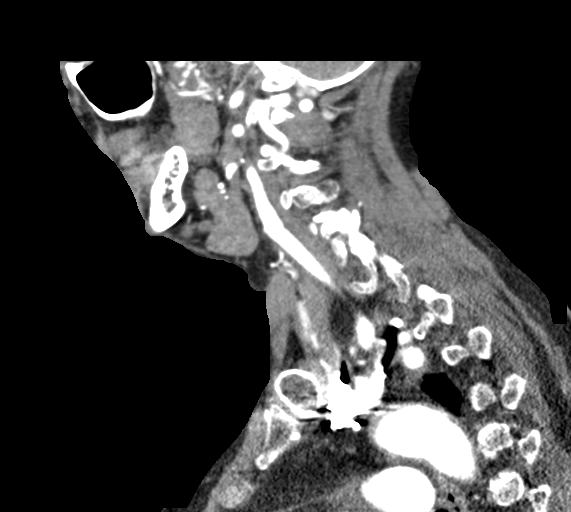

[9 of 34 positions shown; findings below may reference images not displayed]

RADIATION DOSE REDUCTION: This exam was performed according to the
departmental dose-optimization program which includes automated
exposure control, adjustment of the mA and/or kV according to
patient size and/or use of iterative reconstruction technique.

CONTRAST:  75mL OMNIPAQUE IOHEXOL 350 MG/ML SOLN
FINDINGS: Aortic arch: Imaged aortic arch is normal. The origins of the major
branch vessels are patent. The subclavian arteries are patent to the
level imaged with mild plaque at the origin on the right.

Right carotid system: The right common, internal, and external
carotid arteries are patent, without hemodynamically significant
stenosis or occlusion. There is no dissection or aneurysm.

Left carotid system: The left common, internal, and external carotid
arteries are patent. There is mild narrowing of the internal carotid
artery as it passes between the C1 lateral mass and the styloid
process (4-91, 12-128). Otherwise, the internal carotid artery is
widely patent. There is no dissection or aneurysm.

Vertebral arteries: There is moderate to severe stenosis at the
origin of the right vertebral artery. The remainder of the right
vertebral artery is widely patent. The left vertebral artery is
patent. There is no evidence of dissection or aneurysm.

Skeleton: There is no acute osseous abnormality or suspicious
osseous lesion. There is no visible canal hematoma.

Other neck: The soft tissues are unremarkable.

Upper chest: There are subpleural reticular opacities in both lung
apices. There is ground-glass opacity in the left apex measuring up
to 1.4 cm (7-211).
IMPRESSION: 1. Moderate to severe stenosis at the origin of the right vertebral
artery.
2. Mild narrowing of the left internal carotid artery is a passes
between the styloid process and left C1 lateral mass.
3. Otherwise, patent vasculature of the neck with no hemodynamically
significant stenosis or occlusion. No significant atherosclerotic
disease at the carotid bifurcations.
4. Ground-glass opacity in the left lung apex measuring up to
cm, and subpleural reticular opacities in both lung apices.
Recommend nonemergent dedicated CT of the chest for further
evaluation.

## 2022-04-19 MED ORDER — IOHEXOL 350 MG/ML SOLN
75.0000 mL | Freq: Once | INTRAVENOUS | Status: AC | PRN
  Administered 2022-04-19: 75 mL via INTRAVENOUS

## 2022-04-19 MED ORDER — PERFLUTREN LIPID MICROSPHERE
1.0000 mL | INTRAVENOUS | Status: AC | PRN
  Administered 2022-04-19: 2 mL via INTRAVENOUS

## 2022-04-19 MED ORDER — CLOPIDOGREL BISULFATE 75 MG PO TABS: 75.0000 mg | ORAL_TABLET | Freq: Every day | ORAL | Status: DC

## 2022-04-19 MED ORDER — ASPIRIN 81 MG PO TBEC: 81.0000 mg | DELAYED_RELEASE_TABLET | Freq: Every day | ORAL | 1 refills | Status: DC

## 2022-04-19 MED ORDER — ASPIRIN 81 MG PO TBEC
81.0000 mg | DELAYED_RELEASE_TABLET | Freq: Every day | ORAL | 0 refills | Status: AC
Start: 2022-04-19 — End: 2022-04-26

## 2022-04-19 MED ORDER — CLOPIDOGREL BISULFATE 75 MG PO TABS
300.0000 mg | ORAL_TABLET | Freq: Once | ORAL | Status: AC
Start: 2022-04-19 — End: 2022-04-19
  Administered 2022-04-19: 300 mg via ORAL
  Filled 2022-04-19: qty 4

## 2022-04-19 MED ORDER — ROSUVASTATIN CALCIUM 20 MG PO TABS: 20.0000 mg | ORAL_TABLET | Freq: Every day | ORAL | 11 refills | Status: AC

## 2022-04-19 MED ORDER — APIXABAN 5 MG PO TABS: 5.0000 mg | ORAL_TABLET | Freq: Two times a day (BID) | ORAL | 2 refills | Status: DC

## 2022-04-19 NOTE — Hospital Course (Signed)
76 year old male with past medical history of seizure disorder dyslipidemia and anemia who presented to the emergency room (after he brought himself in) on 5/17 and previously in good health when he drove to a restaurant with his wife and it was noted that he was making a large turn with and missing objects and feels the vision to his right peripheral side.  No headache or other symptoms.  In the emergency room, MRI revealed small acute infarcts in the left frontal and left parietal occipital cortex on MRI.  Patient brought in for further evaluation.  Neurology consulted.  Initially patient started on aspirin and Plavix.  However, 2D echo on 5/18 revealed evidence of cardiomyopathy with decreased ejection fraction of 35 to 40% (previously normal ejection fraction 2 years prior.)  Cardiology felt that mural thrombus could not be excluded and it was recommended by cardiology to proceed to full anticoagulation.  Patient seen by PT and OT and speech and cleared.

## 2022-04-19 NOTE — Assessment & Plan Note (Signed)
As mentioned above, significant decrease ejection fraction as compared to echocardiogram 2 years ago at this time.  Diastolic dysfunction seen back pain 2.  Patient is currently euvolemic and not felt to be in acute heart failure.  Even despite acute CVA, systolic blood pressures initially on hospitalization were in the 170s when he first presented to the emergency room, but since then have been in the 100s.  Given soft blood pressures and heart rates in the 60s, at this time, will hold off on starting patient on beta-blocker or ARB.

## 2022-04-19 NOTE — Evaluation (Signed)
Speech Language Pathology Evaluation Patient Details Name: Mitchell Barnes MRN: [id number] DOB: Apr 22, 1946 Today's Date: 04/19/2022 Time: 1103-1130 SLP Time Calculation (min) (ACUTE ONLY): 27 min  Problem List:  Patient Active Problem List   Diagnosis Date Noted   CVA (cerebral vascular accident) (HCC) 04/18/2022   Anemia    Coronary artery calcification seen on CT scan 04/20/2020   Hyperlipidemia LDL goal <70 04/20/2020   Essential hypertension 04/20/2020   Right bundle branch block 04/20/2020   Aortic root dilation (HCC) 04/20/2020   Seizure (HCC)    Hyperlipidemia    Chest pain radiating to arm 04/15/2020   Atypical chest pain 04/15/2020   Elevated troponin 04/15/2020   Past Medical History:  Past Medical History:  Diagnosis Date   Anemia    HLD (hyperlipidemia)    Seizures (HCC)    Past Surgical History:  Past Surgical History:  Procedure Laterality Date   EYE SURGERY     HPI:  Per H&P "Mitchell Barnes is a 76 y.o. male with medical history significant for seizure disorder, dyslipidemia and anemia who presents to the ER via private vehicle for evaluation of visual field defects.  Patient states that he was in his usual state of health until about noon when he was driving to a restaurant with his wife for lunch.  His wife notes that he was making large turns and missing carbs which was unusual for him.  Patient also noted right-sided peripheral vision change and while at lunch was having difficulty eating his food because he could not see his fork on the right side.  He denies having any headache, no difficulty swallowing, no blurred vision, no dizziness, no lightheadedness, no double vision, no focal deficits, no falls, no chest pain, no shortness of breath, no nausea, no vomiting, no leg swelling, no changes in his bowel habits or urinary symptoms.  Code stroke was activated when he arrived to triage.  Last known well was the evening the day prior to presentation.   Patient was seen by neurology and was not a candidate for tPA.  He will be admitted to the hospital for further evaluation."   Assessment / Plan / Recommendation Clinical Impression  Pt seen for for cognitive-linguistic evaluation. Pt alert, pleasant, and cooperative. Wife at bedside. Both deny pt with changes to speech/language/cognition. Pt reports reduced, but improving, peripheral vision.   Assessment completed via informal means and portions of Cognistat. Pt demonstrated intact basic/functional cognitive-linguistic ability. Speech is fluent, appropriate, and without s/sx anomia or dysarthria.   SLP to sign off as pt has no acute SLP needs at this time. Recommend assistance PRN given reported changes to peripheral vision. No driving until cleared by MD.   Pt, pt's wife, and RN made aware of results, recommendations, and SLP POC. Pt and wife made aware of results, recommendations, and SLP POC.    SLP Assessment  SLP Recommendation/Assessment: Patient does not need any further Speech Lanaguage Pathology Services SLP Visit Diagnosis: Cognitive communication deficit (R41.841)    Recommendations for follow up therapy are one component of a multi-disciplinary discharge planning process, led by the attending physician.  Recommendations may be updated based on patient status, additional functional criteria and insurance authorization.    Follow Up Recommendations  No SLP follow up    Assistance Recommended at Discharge  PRN (given visual changes)  Functional Status Assessment Patient has not had a recent decline in their functional status     SLP Evaluation Cognition  Overall Cognitive Status:  Within Functional Limits for tasks assessed Orientation Level: Oriented X4 Attention: Focused;Sustained Focused Attention: Appears intact Sustained Attention: Appears intact Memory: Appears intact Awareness: Appears intact Problem Solving: Appears intact Executive Function: Reasoning Reasoning:  Appears intact Safety/Judgment: Appears intact       Comprehension  Auditory Comprehension Overall Auditory Comprehension: Appears within functional limits for tasks assessed Yes/No Questions: Within Functional Limits Commands: Within Functional Limits Conversation: Complex [facility]) Visual Recognition/Discrimination Discrimination: Not tested Reading Comprehension Reading Status: Not tested    Expression Expression Primary Mode of Expression: Verbal Verbal Expression Overall Verbal Expression: Appears within functional limits for tasks assessed Initiation: No impairment Automatic Speech:  (WFL) Level of Generative/Spontaneous Verbalization: Conversation (WFL) Repetition: No impairment Naming: No impairment Pragmatics: No impairment   Oral / Motor  Oral Motor/Sensory Function Overall Oral Motor/Sensory Function: Within functional limits Motor Speech Overall Motor Speech: Appears within functional limits for tasks assessed Respiration: Within functional limits Phonation: Normal Resonance: Within functional limits Articulation: Within functional limitis Intelligibility: Intelligible Motor Planning: Witnin functional limits           Clyde Canterbury, M.S., CCC-SLP Speech-Language Pathologist [facility] [phone] (ASCOM)   Woodroe Chen 04/19/2022, 1:43 PM

## 2022-04-19 NOTE — Assessment & Plan Note (Signed)
As above, changed to Crestor.

## 2022-04-19 NOTE — Progress Notes (Signed)
Subjective: PAtient reports improvement in his vision.   Exam: Vitals:   04/19/22 0530 04/19/22 0734  BP: 108/68 119/70  Pulse: 63 67  Resp: 19 18  Temp: 98.2 F (36.8 C) 98.2 F (36.8 C)  SpO2: 94% 93%   Gen: In bed, NAD Resp: non-labored breathing, no acute distress Abd: soft, nt  Neuro: MS: awake, alert, interactive and apporriate.  XB:DZHG, face symmetric, I am unable to detect any cut in his field today, but he still subjectively feels there is some limitation.  Motor: MAEW  Pertinent Labs: LDL 91  Impression: 76 yo M with embolic appearing infarcts in the left carotid distribution. Possibilities include cardiac embolus, but also artery to artery. No significant stenosis on H+N MRA, but the origens of the cartoids are not well visualized and given the distribution, I would like to confirm there is no carotid origen stenosis. Also, given there eis stroke on MRI, wil add plavix x 3 weeks.   Recommendations: 1) CTA head and neck.  2) Plavix 75mg  daily after 300mg  load.  3) agree with more intensive statin therapy given LDL > 70(h/o cramps with atorvastatin, think increasing dose of pravastatin is reasonable given this h/o intolerance and relatively small reduction needed.  4) echo, tele 5) may need ILR if no other cause is found.   , MD [facility] [phone]  If 7pm- 7am, please page neurology on call as listed in AMION.

## 2022-04-19 NOTE — Progress Notes (Signed)
*  PRELIMINARY RESULTS* Echocardiogram 2D Echocardiogram has been performed.  Mitchell Barnes 04/19/2022, 9:57 AM

## 2022-04-19 NOTE — Discharge Summary (Signed)
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 targeted by this assay, and inadequate number of viral copies (<250 copies / mL). A negative result must be combined with clinical observations, patient history, and epidemiological information. Fact Sheet for Patients:   StrictlyIdeas.no Fact Sheet for Healthcare Providers: BankingDealers.co.za This test is not yet approved or cleared  by the [place] FDA and has been authorized for detection and/or diagnosis of SARS-CoV-2 by FDA under an Emergency Use Authorization (EUA).  This EUA will remain in effect (meaning this  test can be used) for the duration of the COVID-19 declaration under Section 564(b)(1) of the Act, 21 U.S.C. section 360bbb-3(b)(1), unless the authorization is terminated or revoked sooner. Performed at [facility], [address] [place], [place] [address]     Labs: CBC: Recent Labs  Lab 04/18/22 1444  WBC 8.1  NEUTROABS 4.6  HGB 12.3*  HCT 38.6*  MCV 95.1  PLT 123XX123   Basic Metabolic Panel: Recent Labs  Lab 04/18/22 1444  NA 137  K 4.3  CL 105  CO2 25  GLUCOSE 102*  BUN 21  CREATININE 1.27*  CALCIUM 8.9   Liver Function Tests: Recent Labs  Lab  04/18/22 1444  AST 27  ALT 17  ALKPHOS 57  BILITOT 0.3  PROT 7.7  ALBUMIN 3.7   CBG: Recent Labs  Lab 04/18/22 1444  GLUCAP 101*    Discharge time spent: greater than 30 minutes.  Signed: Annita Brod, MD [facility] 04/19/2022

## 2022-04-19 NOTE — Progress Notes (Signed)
SLP Cancellation Note  Patient Details Name: Mitchell Barnes [place] MRN: [id number] DOB: Feb 16, 1946   Cancelled treatment:       Reason Eval/Treat Not Completed: Patient at procedure or test/unavailable   SLP consult received and appreciated. Chart review completed. Pt OTF for CTA. Will continue efforts as appropriate.   Cherrie Gauze, M.S., [facility] [phone] ([place])  Quintella Baton 04/19/2022, 10:14 AM

## 2022-04-19 NOTE — Progress Notes (Signed)
PT Cancellation Note  Patient Details Name: Mitchell Barnes MRN: [id number] DOB: [birth year]   Cancelled Treatment:    Reason Eval/Treat Not Completed: PT screened, no needs identified, will sign off. Per discussion with OT after her evaluation, patient has no mobility issues at this time.    Mitchell Barnes 04/19/2022, 9:20 AM

## 2022-04-19 NOTE — Evaluation (Addendum)
Occupational Therapy Evaluation Patient Details Name: Mitchell Barnes MRN: [id number] DOB: 06/17/1946 Today's Date: 04/19/2022   History of Present Illness Mitchell Barnes is a 76 y.o. male with medical history significant for seizure disorder, dyslipidemia and anemia who presents to the ER via private vehicle for evaluation of visual field defects.  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   Clinical Impression   Pt seen for OT evaluation.  Pt reports R peripheral vision has improved greatly since yesterday.  Pt appears to have near normal peripheral vision, lacking just short of 90 degrees at 70-80 degrees of clear vision to the R.  See below for additional vision assessment.  OT educated on compensatory strategies with head turns, positioning objects to his R to form new habits of scanning to the R, positioning any visitors to his R.  Reinforced importance of head turns to maximize indep with navigating new/unfamiliar environments, especially to reduce fall risk.  Reviewed signs/symptoms of stroke and when to seek medical attention.  Pt/spouse verbalized understanding of all education provided.  Pt is indep with ADLs and mobility.  No additional therapy follow up indicated at this time other than Ophthalmology follow up at discharge which has already been recommended by MD.   Recommendations for follow up therapy are one component of a multi-disciplinary discharge planning process, led by the attending physician.  Recommendations may be updated based on patient status, additional functional criteria and insurance authorization.   Follow Up Recommendations  No OT follow up    Assistance Recommended at Discharge None  Patient can return home with the following Assist for transportation (indep/at baseline with ADLs and mobility; no assist needed)     Functional Status Assessment  Patient has not had a recent decline in their functional status  Equipment Recommendations  None recommended by OT           Precautions / Restrictions Precautions Precautions: Other (comment) Precaution Comments: R peripheral field deficit Restrictions Weight Bearing Restrictions: No      Mobility Bed Mobility Overal bed mobility: Independent             General bed mobility comments: indep Patient Response: Cooperative  Transfers Overall transfer level: Independent                 General transfer comment: indep without AD      Balance Overall balance assessment: No apparent balance deficits (not formally assessed), Independent                                         ADL either performed or assessed with clinical judgement   ADL Overall ADL's : At baseline                                       General ADL Comments: OT reviewed compensatory strategies, including head turns to the R, especially in unfamiliar environments, positioning supplies to the R within personal space, positioning visitors to the R in extra personal space.     Vision Baseline Vision/History: 0 No visual deficits Patient Visual Report: Peripheral vision impairment Vision Assessment?: Yes Ocular Range of Motion: Within Functional Limits Alignment/Gaze Preference: Within  Defined Limits Tracking/Visual Pursuits: Able to track stimulus in all quads without difficulty Saccades: Within functional limits Convergence: Within functional limits Visual Fields: Right visual field deficit (Extremely mild and pt states significant improvement since yesterday.  Pt demos ability to see 70-80 degrees clearly to the R, near normal.)     Perception Perception Perception: Within Functional Limits   Praxis Praxis Praxis: Intact    Pertinent Vitals/Pain Pain Assessment Pain Assessment: No/denies pain     Hand Dominance Right    Extremity/Trunk Assessment Upper Extremity Assessment Upper Extremity Assessment: Overall WFL for tasks assessed   Lower Extremity Assessment Lower Extremity Assessment: Overall WFL for tasks assessed   Cervical / Trunk Assessment Cervical / Trunk Assessment: Normal   Communication Communication Communication: No difficulties   Cognition Arousal/Alertness: Awake/alert Behavior During Therapy: WFL for tasks assessed/performed Overall Cognitive Status: Within Functional Limits for tasks assessed                                 General Comments: A&O x4, good historian     General Comments  Pt reports hx of orthostatic hypotension as a cause to a fall within the last 6 mo, but now compensates by making slow positional changes.    Exercises Other Exercises Other Exercises: Role of OT         Home Living Family/patient expects to be discharged to:: Private residence Living Arrangements: Spouse/significant other Available Help at Discharge: Family Type of Home: House Home Access: Stairs to enter;Elevator (pt put in elevator for spouse who has arthritis but pt is able to manage the steps) Entrance Stairs-Number of Steps: 6 Entrance Stairs-Rails: Right;Left Home Layout: One level     Bathroom Shower/Tub: Chief Strategy Officer: Standard Bathroom Accessibility: Yes   Home Equipment: Grab bars - tub/shower   Additional Comments: also has tub bar on edge of tub      Prior Functioning/Environment Prior Level of Function : Independent/Modified Independent             Mobility Comments: indep without AD household and community distances ADLs Comments: modified indep with ADLs, including driving        OT Problem List: Impaired vision/perception      OT Treatment/Interventions:      OT Goals(Current goals can be found in the care plan section) Acute Rehab OT Goals Patient Stated Goal: To go home with spouse OT Goal Formulation: With  patient Time For Goal Achievement: 04/21/22 Potential to Achieve Goals: Good  OT Frequency:                    AM-PAC OT "6 Clicks" Daily Activity     Outcome Measure Help from another person eating meals?: None Help from another person taking care of personal grooming?: None Help from another person toileting, which includes using toliet, bedpan, or urinal?: None Help from another person bathing (including washing, rinsing, drying)?: None Help from another person to put on and taking off regular upper body clothing?: None Help from another person to put on and taking off regular lower body clothing?: None 6 Click Score: 24   End of Session Equipment Utilized During Treatment: Other (comment) (none) Nurse Communication: Mobility status;Other (comment) (no additional therapy needs)  Activity Tolerance: Patient tolerated treatment well Patient left: in bed;with family/visitor present  OT Visit Diagnosis: Other (comment) (R visual field deficit)  Time: 6045-4098 OT Time Calculation (min): 30 min Charges:  OT General Charges $OT Visit: 1 Visit OT Evaluation $OT Eval Low Complexity: 1 Low OT Treatments $Self Care/Home Management : 8-22 mins  Danelle Earthly, MS, OTR/L   Otis Dials 04/19/2022, 9:36 AM

## 2022-04-19 NOTE — Progress Notes (Signed)
   04/19/22 1000  Clinical Encounter Type  Visited With Patient and family together  Visit Type Follow-up   Chaplain provided follow up support to patient who was previously seen in ED

## 2022-04-23 ENCOUNTER — Ambulatory Visit
Admission: EM | Admit: 2022-04-23 | Discharge: 2022-04-23 | Disposition: A | Discharge status: Home / Self Care | Payer: Medicare Other

## 2022-04-23 ENCOUNTER — Encounter: Payer: Self-pay | Admitting: Emergency Medicine

## 2022-04-23 DIAGNOSIS — L03116 Cellulitis of left lower limb: Secondary | ICD-10-CM | POA: Diagnosis not present

## 2022-04-23 DIAGNOSIS — W57XXXA Bitten or stung by nonvenomous insect and other nonvenomous arthropods, initial encounter: Secondary | ICD-10-CM | POA: Diagnosis not present

## 2022-04-23 DIAGNOSIS — S80262A Insect bite (nonvenomous), left knee, initial encounter: Secondary | ICD-10-CM

## 2022-04-23 MED ORDER — DOXYCYCLINE HYCLATE 100 MG PO CAPS: 100.0000 mg | ORAL_CAPSULE | Freq: Two times a day (BID) | ORAL | 0 refills | Status: DC

## 2022-04-23 NOTE — ED Provider Notes (Signed)
[facility]    CSN: [id number] Arrival date & time: 04/23/22  1112      History   Chief Complaint Chief Complaint  Patient presents with   Tick Removal    HPI Mitchell Barnes is a 76 y.o. male who presents with redness on his R medial knee after using his knife to remove the head of a long star tick 3 days ago. His wife pulled the tick, but the head was left behind. He does not know how long this was on this side, but recalls seeing a black spot the day before, and thought it was a scab.     Past Medical History:  Diagnosis Date   Anemia    HLD (hyperlipidemia)    Seizures (HCC)     Patient Active Problem List   Diagnosis Date Noted   Chronic combined systolic and diastolic CHF (congestive heart failure) (HCC) 04/19/2022   CVA (cerebral vascular accident) (HCC) 04/18/2022   Anemia    Coronary artery calcification seen on CT scan 04/20/2020   Hyperlipidemia LDL goal <70 04/20/2020   Essential hypertension 04/20/2020   Right bundle branch block 04/20/2020   Aortic root dilation (HCC) 04/20/2020   Seizure (HCC)    Hyperlipidemia    Chest pain radiating to arm 04/15/2020   Atypical chest pain 04/15/2020   Elevated troponin 04/15/2020    Past Surgical History:  Procedure Laterality Date   EYE SURGERY         Home Medications    Prior to Admission medications   Medication Sig Start Date End Date Taking? Authorizing Provider  doxycycline (VIBRAMYCIN) 100 MG capsule Take 1 capsule (100 mg total) by mouth 2 (two) times daily. 04/23/22  Yes Rodriguez-Southworth, Nettie Elm, PA-C  acetaminophen (TYLENOL) 650 MG CR tablet Take 650 mg by mouth every 8 (eight) hours as needed for pain or fever.    [provider]  apixaban (ELIQUIS) 5 MG TABS tablet Take 1 tablet (5 mg total) by mouth 2 (two) times daily. Do not start this medication until 5/25. 04/26/22   Hollice Espy, MD  aspirin EC 81 MG tablet Take 1 tablet (81 mg total) by mouth daily for 7  days. 1 week only 04/19/22 04/26/22  Hollice Espy, MD  ferrous sulfate 325 (65 FE) MG tablet Take 325 mg by mouth 2 (two) times a week. Monday, Thursday    [provider]  PHENobarbital (LUMINAL) 32.4 MG tablet Take 64.8 mg by mouth 2 (two) times daily. 04/11/20   [provider]  pravastatin (PRAVACHOL) 80 MG tablet 1 tablet    [provider]  rosuvastatin (CRESTOR) 20 MG tablet Take 1 tablet (20 mg total) by mouth daily. 04/19/22 04/19/23  Hollice Espy, MD  vitamin B-12 (CYANOCOBALAMIN) 500 MCG tablet Take 500 mcg by mouth daily.    [provider]  Vitamin D, Ergocalciferol, (DRISDOL) 1.25 MG (50000 UNIT) CAPS capsule Take 50,000 Units by mouth once a week. 11/29/21   [provider]    Family History Family History  Problem Relation Age of Onset   Heart attack Father     Social History Social History   Tobacco Use   Smoking status: Never   Smokeless tobacco: Never  Vaping Use   Vaping Use: Never used  Substance Use Topics   Alcohol use: Yes   Drug use: No     Allergies   Atorvastatin, Dilantin [phenytoin], and Fluconazole   Review of Systems Review of Systems  Constitutional:  Negative for chills and fever.  Musculoskeletal:  Negative for arthralgias, joint swelling and myalgias.  Skin:  Positive for color change and rash. Negative for wound.    Physical Exam Triage Vital Signs ED Triage Vitals  Enc Vitals Group     BP 04/23/22 1258 123/79     Pulse Rate 04/23/22 1258 75     Resp 04/23/22 1258 16     Temp 04/23/22 1258 98.2 F (36.8 C)     Temp Source 04/23/22 1258 Oral     SpO2 04/23/22 1258 94 %     Weight --      Height --      Head Circumference --      Peak Flow --      Pain Score 04/23/22 1300 0     Pain Loc --      Pain Edu? --      Excl. in GC? --    No data found.  Updated Vital Signs BP 123/79 (BP Location: Left Arm)   Pulse 75   Temp 98.2 F (36.8 C) (Oral)   Resp 16   SpO2 94%    Visual Acuity Right Eye Distance:   Left Eye Distance:   Bilateral Distance:    Right Eye Near:   Left Eye Near:    Bilateral Near:     Physical Exam Vitals and nursing note reviewed.  HENT:     Right Ear: External ear normal.     Left Ear: External ear normal.  Eyes:     General: No scleral icterus.    Conjunctiva/sclera: Conjunctivae normal.  Pulmonary:     Effort: Pulmonary effort is normal.  Musculoskeletal:        General: Normal range of motion.     Cervical back: Neck supple.  Skin:    Findings: Erythema present.     Comments: 3 cm x 3 cm redness and warmth on R medial knee, no streaking and with magnifying glass I do not see any tick parts   Neurological:     Mental Status: He is alert and oriented to person, place, and time.     Gait: Gait normal.  Psychiatric:        Mood and Affect: Mood normal.        Behavior: Behavior normal.        Thought Content: Thought content normal.        Judgment: Judgment normal.     UC Treatments / Results  Labs (all labs ordered are listed, but only abnormal results are displayed) Labs Reviewed - No data to display  EKG   Radiology No results found.  Procedures Procedures (including critical care time)  Medications Ordered in UC Medications - No data to display  Initial Impression / Assessment and Plan / UC Course  I have reviewed the triage vital signs and the nursing notes.  I placed him on Doxy as noted.      Final Clinical Impressions(s) / UC Diagnoses   Final diagnoses:  Cellulitis of leg, left  Tick bite of left knee, initial encounter   Discharge Instructions   None    ED Prescriptions     Medication Sig Dispense Auth. Provider   doxycycline (VIBRAMYCIN) 100 MG capsule Take 1 capsule (100 mg total) by mouth 2 (two) times daily. 20 capsule Rodriguez-Southworth, Nettie Elm, PA-C      PDMP not reviewed this encounter.   Garey Ham, Cordelia Poche 04/23/22 2101

## 2022-04-23 NOTE — ED Triage Notes (Addendum)
Pt presents with a rash on his left knee. Pt states he removed a tick 5 days ago.

## 2022-04-27 ENCOUNTER — Inpatient Hospital Stay (HOSPITAL_COMMUNITY)
Admission: EM | Admit: 2022-04-27 | Discharge: 2022-04-29 | DRG: 177 | Disposition: A | Discharge status: Home / Self Care | Payer: Medicare Other | Source: Ambulatory Visit | Attending: Family Medicine | Admitting: Family Medicine

## 2022-04-27 ENCOUNTER — Encounter (HOSPITAL_COMMUNITY): Payer: Self-pay | Admitting: *Deleted

## 2022-04-27 ENCOUNTER — Emergency Department (HOSPITAL_COMMUNITY): Payer: Medicare Other

## 2022-04-27 ENCOUNTER — Other Ambulatory Visit: Payer: Self-pay

## 2022-04-27 DIAGNOSIS — N1831 Chronic kidney disease, stage 3a: Secondary | ICD-10-CM | POA: Diagnosis present

## 2022-04-27 DIAGNOSIS — Z7901 Long term (current) use of anticoagulants: Secondary | ICD-10-CM

## 2022-04-27 DIAGNOSIS — R051 Acute cough: Secondary | ICD-10-CM | POA: Diagnosis not present

## 2022-04-27 DIAGNOSIS — W57XXXA Bitten or stung by nonvenomous insect and other nonvenomous arthropods, initial encounter: Secondary | ICD-10-CM

## 2022-04-27 DIAGNOSIS — J1282 Pneumonia due to coronavirus disease 2019: Secondary | ICD-10-CM | POA: Diagnosis present

## 2022-04-27 DIAGNOSIS — R918 Other nonspecific abnormal finding of lung field: Secondary | ICD-10-CM | POA: Diagnosis not present

## 2022-04-27 DIAGNOSIS — I700 Atherosclerosis of aorta: Secondary | ICD-10-CM | POA: Diagnosis not present

## 2022-04-27 DIAGNOSIS — R569 Unspecified convulsions: Secondary | ICD-10-CM

## 2022-04-27 DIAGNOSIS — I10 Essential (primary) hypertension: Secondary | ICD-10-CM | POA: Diagnosis present

## 2022-04-27 DIAGNOSIS — I5042 Chronic combined systolic (congestive) and diastolic (congestive) heart failure: Secondary | ICD-10-CM | POA: Diagnosis present

## 2022-04-27 DIAGNOSIS — A770 Spotted fever due to Rickettsia rickettsii: Secondary | ICD-10-CM | POA: Diagnosis not present

## 2022-04-27 DIAGNOSIS — U071 COVID-19: Secondary | ICD-10-CM | POA: Diagnosis not present

## 2022-04-27 DIAGNOSIS — I517 Cardiomegaly: Secondary | ICD-10-CM | POA: Diagnosis not present

## 2022-04-27 DIAGNOSIS — Z888 Allergy status to other drugs, medicaments and biological substances status: Secondary | ICD-10-CM

## 2022-04-27 DIAGNOSIS — J9601 Acute respiratory failure with hypoxia: Secondary | ICD-10-CM | POA: Diagnosis present

## 2022-04-27 DIAGNOSIS — G40909 Epilepsy, unspecified, not intractable, without status epilepticus: Secondary | ICD-10-CM | POA: Diagnosis present

## 2022-04-27 DIAGNOSIS — Z8673 Personal history of transient ischemic attack (TIA), and cerebral infarction without residual deficits: Secondary | ICD-10-CM | POA: Diagnosis not present

## 2022-04-27 DIAGNOSIS — S70362A Insect bite (nonvenomous), left thigh, initial encounter: Secondary | ICD-10-CM | POA: Diagnosis not present

## 2022-04-27 DIAGNOSIS — E785 Hyperlipidemia, unspecified: Secondary | ICD-10-CM | POA: Diagnosis present

## 2022-04-27 DIAGNOSIS — Z79899 Other long term (current) drug therapy: Secondary | ICD-10-CM | POA: Diagnosis not present

## 2022-04-27 DIAGNOSIS — I639 Cerebral infarction, unspecified: Secondary | ICD-10-CM | POA: Diagnosis present

## 2022-04-27 DIAGNOSIS — Z66 Do not resuscitate: Secondary | ICD-10-CM | POA: Diagnosis not present

## 2022-04-27 DIAGNOSIS — R9431 Abnormal electrocardiogram [ECG] [EKG]: Secondary | ICD-10-CM | POA: Diagnosis not present

## 2022-04-27 DIAGNOSIS — I130 Hypertensive heart and chronic kidney disease with heart failure and stage 1 through stage 4 chronic kidney disease, or unspecified chronic kidney disease: Secondary | ICD-10-CM | POA: Diagnosis present

## 2022-04-27 DIAGNOSIS — N183 Chronic kidney disease, stage 3 unspecified: Secondary | ICD-10-CM

## 2022-04-27 LAB — COMPREHENSIVE METABOLIC PANEL
ALT: 23 U/L (ref 0–44)
AST: 54 U/L — ABNORMAL HIGH (ref 15–41)
Albumin: 3.4 g/dL — ABNORMAL LOW (ref 3.5–5.0)
Alkaline Phosphatase: 46 U/L (ref 38–126)
Anion gap: 10 (ref 5–15)
BUN: 19 mg/dL (ref 8–23)
CO2: 20 mmol/L — ABNORMAL LOW (ref 22–32)
Calcium: 8.5 mg/dL — ABNORMAL LOW (ref 8.9–10.3)
Chloride: 105 mmol/L (ref 98–111)
Creatinine, Ser: 1.47 mg/dL — ABNORMAL HIGH (ref 0.61–1.24)
GFR, Estimated: 49 mL/min — ABNORMAL LOW (ref >60)
Glucose, Bld: 116 mg/dL — ABNORMAL HIGH (ref 70–99)
Potassium: 4.2 mmol/L (ref 3.5–5.1)
Sodium: 135 mmol/L (ref 135–145)
Total Bilirubin: 0.5 mg/dL (ref 0.3–1.2)
Total Protein: 7 g/dL (ref 6.5–8.1)

## 2022-04-27 LAB — CBC WITH DIFFERENTIAL/PLATELET
Abs Immature Granulocytes: 0.04 10*3/uL (ref 0.00–0.07)
Basophils Absolute: 0 10*3/uL (ref 0.0–0.1)
Basophils Relative: 0 %
Eosinophils Absolute: 0 10*3/uL (ref 0.0–0.5)
Eosinophils Relative: 0 %
HCT: 37.3 % — ABNORMAL LOW (ref 39.0–52.0)
Hemoglobin: 12.4 g/dL — ABNORMAL LOW (ref 13.0–17.0)
Immature Granulocytes: 1 %
Lymphocytes Relative: 11 %
Lymphs Abs: 0.9 10*3/uL (ref 0.7–4.0)
MCH: 31.6 pg (ref 26.0–34.0)
MCHC: 33.2 g/dL (ref 30.0–36.0)
MCV: 94.9 fL (ref 80.0–100.0)
Monocytes Absolute: 1 10*3/uL (ref 0.1–1.0)
Monocytes Relative: 12 %
Neutro Abs: 6.1 10*3/uL (ref 1.7–7.7)
Neutrophils Relative %: 76 %
Platelets: 215 10*3/uL (ref 150–400)
RBC: 3.93 MIL/uL — ABNORMAL LOW (ref 4.22–5.81)
RDW: 13.1 % (ref 11.5–15.5)
WBC: 8.1 10*3/uL (ref 4.0–10.5)
nRBC: 0 % (ref 0.0–0.2)

## 2022-04-27 LAB — RESP PANEL BY RT-PCR (FLU A&B, COVID) ARPGX2
Influenza A by PCR: NEGATIVE
Influenza B by PCR: NEGATIVE
SARS Coronavirus 2 by RT PCR: POSITIVE — AB

## 2022-04-27 LAB — PROTIME-INR
INR: 1.1 (ref 0.8–1.2)
Prothrombin Time: 13.6 seconds (ref 11.4–15.2)

## 2022-04-27 LAB — APTT: aPTT: 29 seconds (ref 24–36)

## 2022-04-27 LAB — LACTIC ACID, PLASMA: Lactic Acid, Venous: 1.2 mmol/L (ref 0.5–1.9)

## 2022-04-27 IMAGING — CT CT ANGIO CHEST
2 of 6 series · 17 of 46 positions shown · IV contrast (agent unspecified)
Comparison: [DATE].

CLINICAL DATA: Pulmonary embolism suspected, high probability.
COVID positive.

EXAM:
CT ANGIOGRAPHY CHEST WITH CONTRAST
TECHNIQUE: Multidetector CT imaging of the chest was performed using the
standard protocol during bolus administration of intravenous
contrast. Multiplanar CT image reconstructions and MIPs were
obtained to evaluate the vascular anatomy.

[Series 6: thins · axial · 0.82mm/px · z∈[+1159,+1450]mm · 14 of 319 slices shown]
[im 14/319  lung]
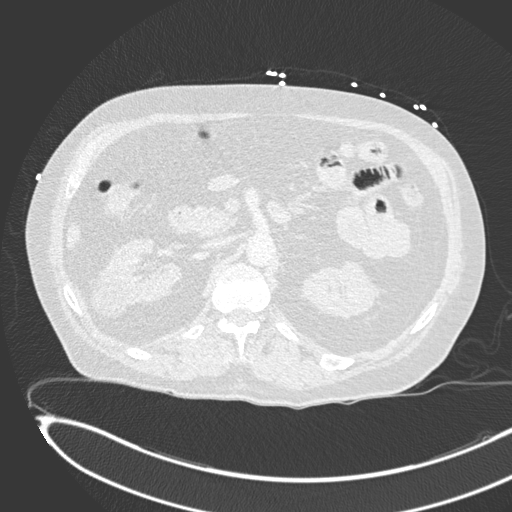
[im 42/319  soft-tissue]
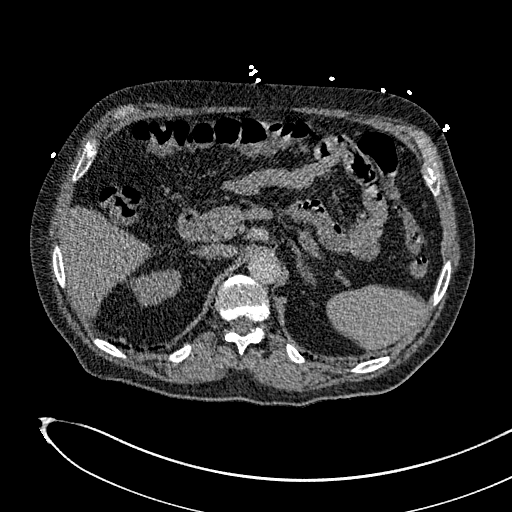
[im 56/319  lung]
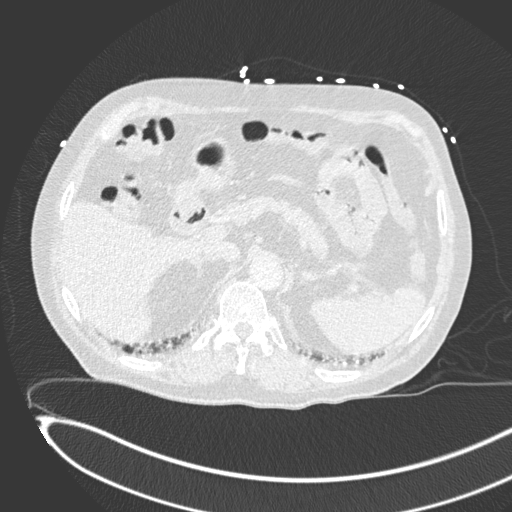
[im 83/319  soft-tissue]
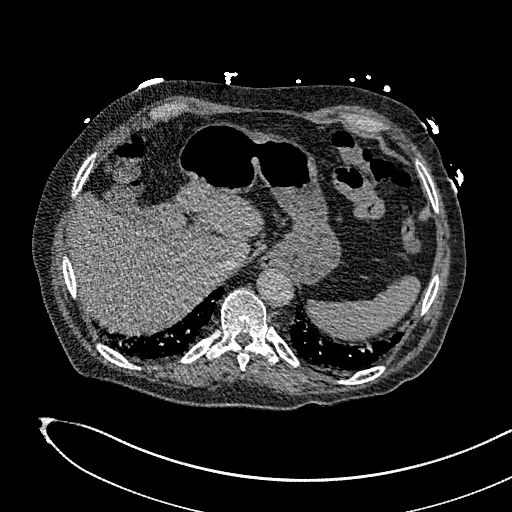
[im 111/319  lung]
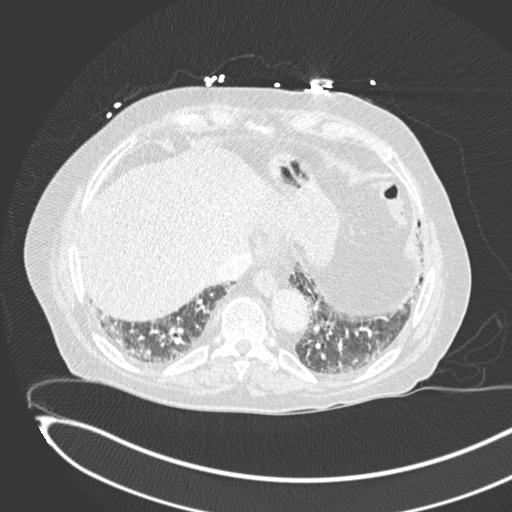
[im 125/319  soft-tissue]
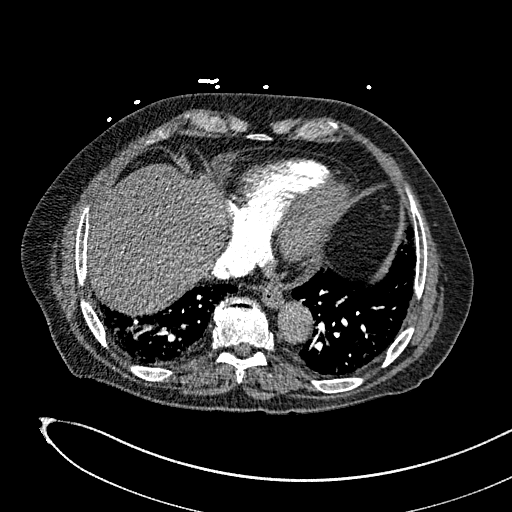
[im 153/319  lung]
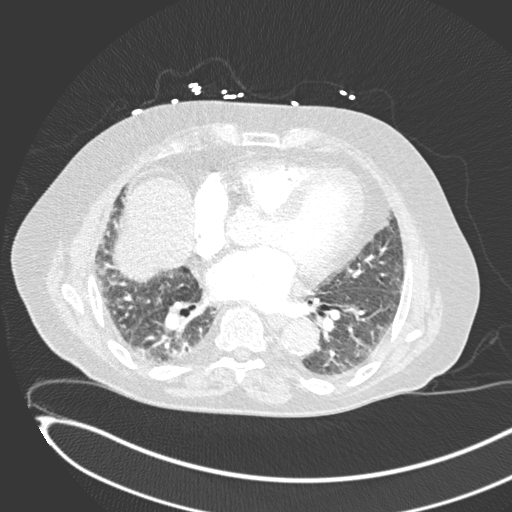
[im 166/319  soft-tissue]
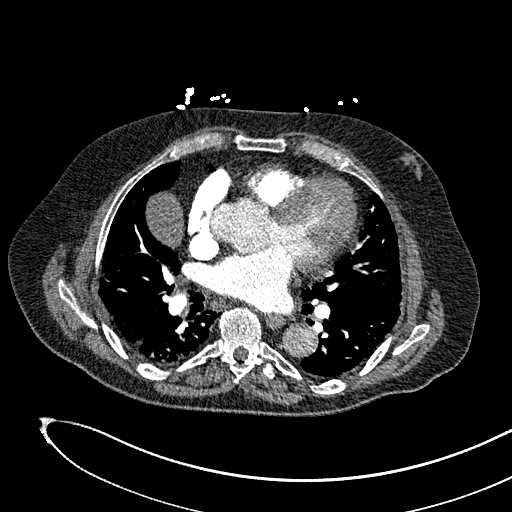
[im 194/319  lung]
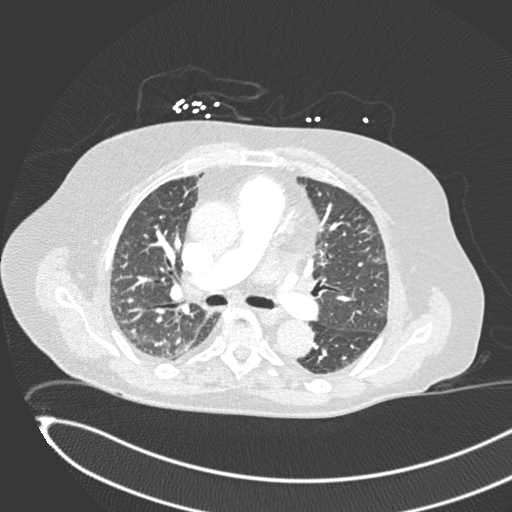
[im 208/319  soft-tissue]
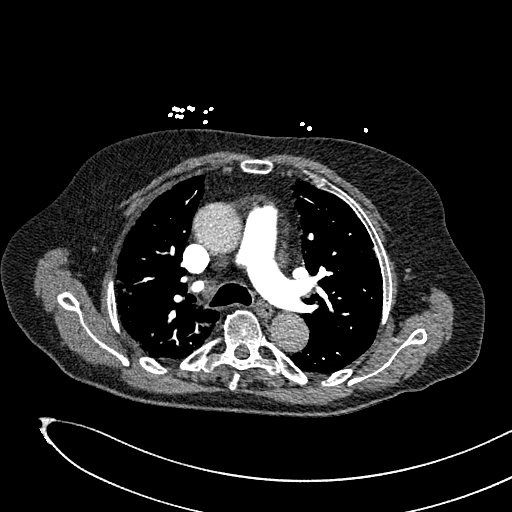
[im 236/319  lung]
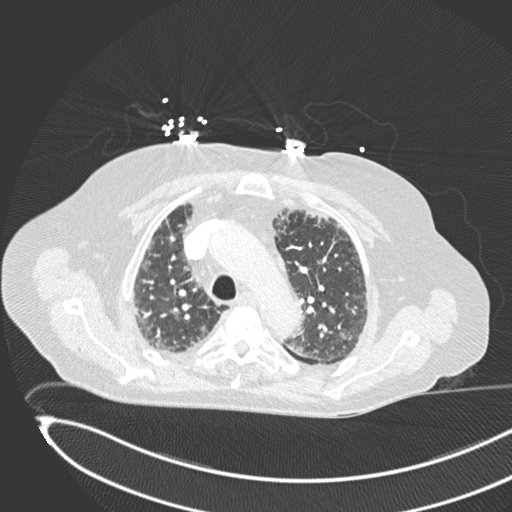
[im 263/319  soft-tissue]
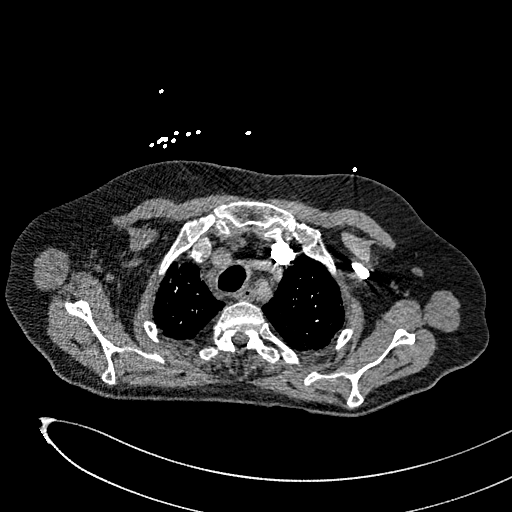
[im 277/319  lung]
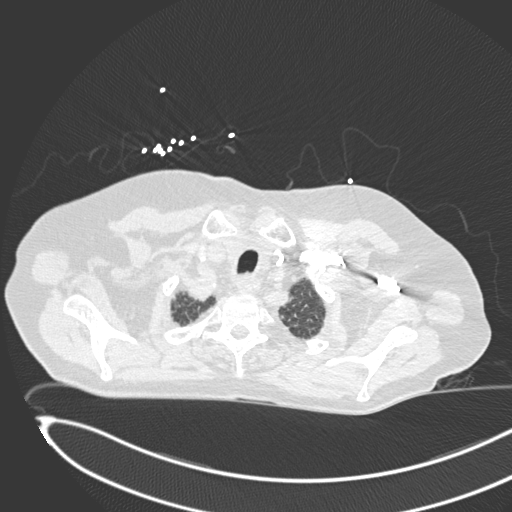
[im 305/319  soft-tissue]
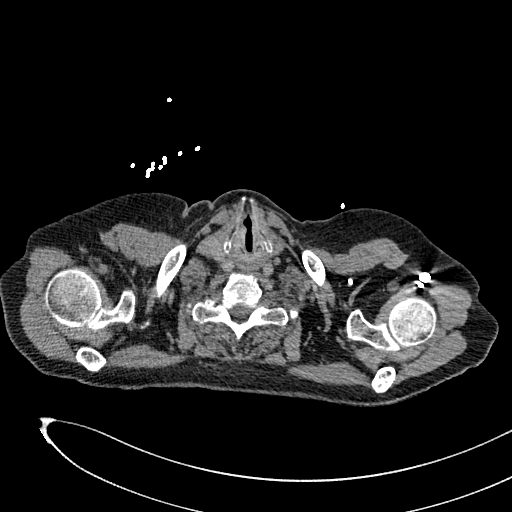

[Series 8: coronal mpr · coronal · 0.63mm/px · 3 of 151 slices shown]
[im 38/151  soft-tissue]
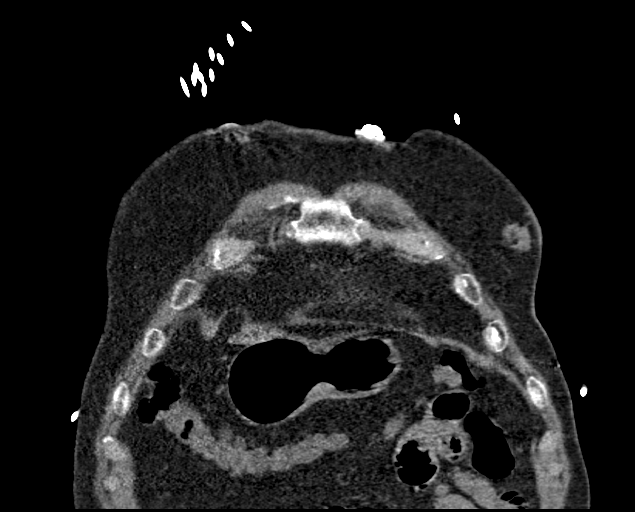
[im 76/151  soft-tissue]
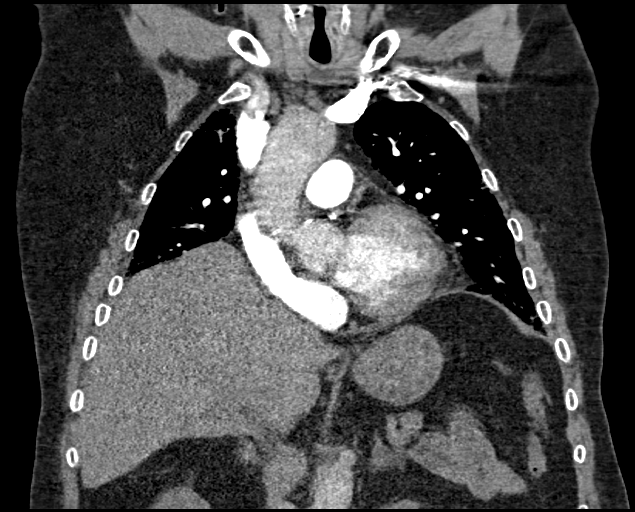
[im 113/151  soft-tissue]
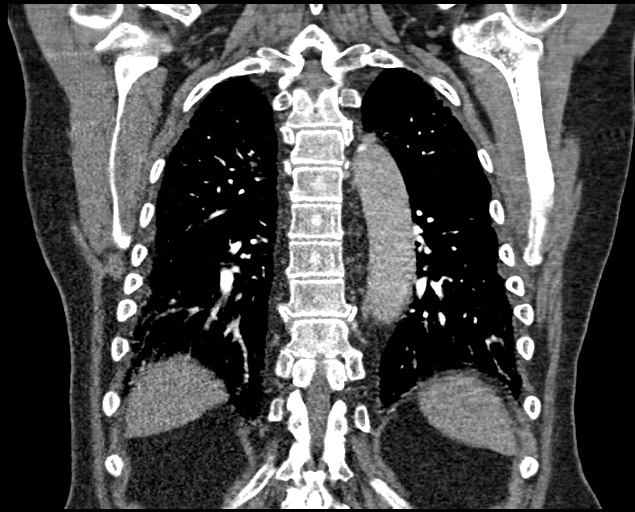

[17 of 46 positions shown; findings below may reference images not displayed]

RADIATION DOSE REDUCTION: This exam was performed according to the
departmental dose-optimization program which includes automated
exposure control, adjustment of the mA and/or kV according to
patient size and/or use of iterative reconstruction technique.

CONTRAST:  75mL OMNIPAQUE IOHEXOL 350 MG/ML SOLN
FINDINGS: Cardiovascular: The heart is enlarged and scattered coronary artery
calcifications are noted. There is a trace pericardial effusion.
There is atherosclerotic calcification of the aorta with aneurysmal
dilatation of the aortic root measuring 4.4 cm. The pulmonary trunk
is normal in caliber. No definite pulmonary artery filling defect.

Mediastinum/Nodes: A nonspecific prominent lymph node is present in
the right paratracheal space measuring 1.1 cm in short axis
diameter. A prominent lymph node is present at the left hilum
measuring 1.4 cm. A prominent lymph node is present at the right
hilum measuring 1 cm. No axillary lymphadenopathy. The thyroid
gland, trachea, and esophagus are within normal limits.

Lungs/Pleura: Patchy ground-glass opacities are noted in the lungs
bilaterally with a peripheral predominance, compatible with history
of atypical pneumonia. No consolidation, effusion, or pneumothorax.

Upper Abdomen: Cyst is present in the right kidney. No acute
abnormality.

Musculoskeletal: Degenerative changes are present in the thoracic
spine. No acute or suspicious osseous abnormality.

Review of the MIP images confirms the above findings.
IMPRESSION: 1. No evidence of pulmonary embolism.
2. Patchy airspace disease in the lungs bilaterally with a
peripheral predominance, compatible with history of atypical
pneumonia.
3. Cardiomegaly with coronary artery calcifications.
4. Aortic atherosclerosis with aneurysmal dilatation of the aortic
root measuring 4.4 cm. Recommend annual imaging followup by CTA or
MRA. This recommendation follows [OI]
ACCF/AHA/AATS/ACR/ASA/SCA/KWAN AY/KWAN AY/KWAN AY/KWAN AY Guidelines for the
Diagnosis and Management of Patients with Thoracic Aortic Disease.
Circulation. [OI]; 121: [id number]. Aortic aneurysm NOS ([OI]-[OI])

## 2022-04-27 IMAGING — CR DG CHEST 1V
1 series · 1 of 1 positions shown · non-contrast
Comparison: [DATE]

CLINICAL DATA: Questionable sepsis

EXAM:
CHEST  1 VIEW

[chest pa]
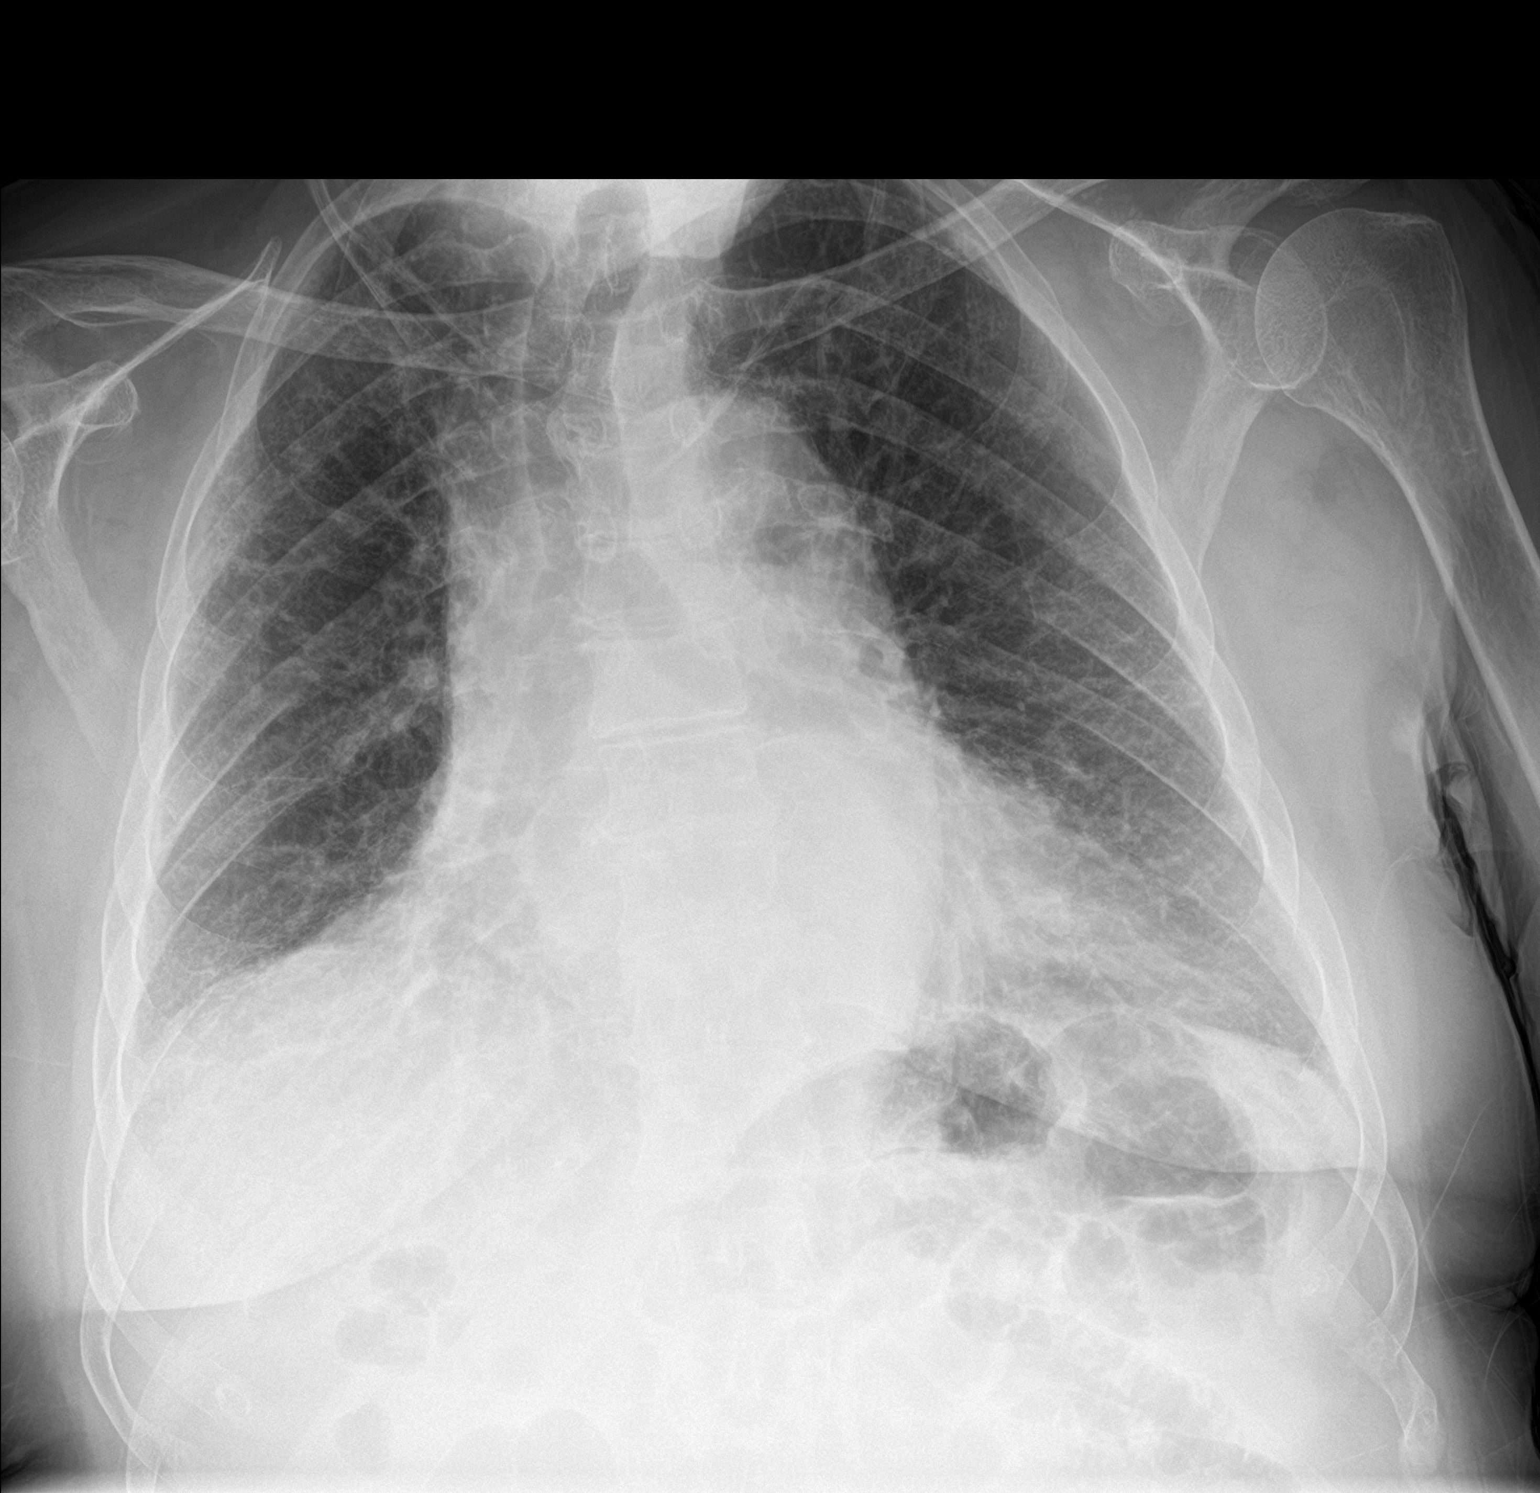

[1 of 1 positions shown; findings below may reference images not displayed]

FINDINGS: Cardiomegaly. Mild, diffuse bilateral interstitial opacity, similar
to prior examination. No new or focal airspace opacity. The
visualized skeletal structures are unremarkable.
IMPRESSION: Cardiomegaly with mild, diffuse bilateral interstitial opacity,
similar to prior examination and likely edema. No new or focal
airspace opacity.

## 2022-04-27 MED ORDER — LACTATED RINGERS IV BOLUS
500.0000 mL | Freq: Once | INTRAVENOUS | Status: AC
  Administered 2022-04-27: 500 mL via INTRAVENOUS

## 2022-04-27 MED ORDER — SODIUM CHLORIDE 0.9 % IV SOLN
200.0000 mg | Freq: Once | INTRAVENOUS | Status: AC
  Administered 2022-04-28: 200 mg via INTRAVENOUS
  Filled 2022-04-27: qty 40

## 2022-04-27 MED ORDER — ACETAMINOPHEN 325 MG PO TABS
ORAL_TABLET | ORAL | Status: AC
  Administered 2022-04-27: 650 mg
  Filled 2022-04-27: qty 2

## 2022-04-27 MED ORDER — IOHEXOL 350 MG/ML SOLN
75.0000 mL | Freq: Once | INTRAVENOUS | Status: AC | PRN
Start: 2022-04-27 — End: 2022-04-27
  Administered 2022-04-27: 75 mL via INTRAVENOUS

## 2022-04-27 MED ORDER — SODIUM CHLORIDE 0.9 % IV SOLN
100.0000 mg | Freq: Every day | INTRAVENOUS | Status: AC
  Administered 2022-04-28 – 2022-04-29 (×2): 100 mg via INTRAVENOUS
  Filled 2022-04-27 (×5): qty 20

## 2022-04-27 MED ORDER — DEXAMETHASONE 6 MG PO TABS
6.0000 mg | ORAL_TABLET | Freq: Every day | ORAL | Status: DC
  Administered 2022-04-28 – 2022-04-29 (×2): 6 mg via ORAL
  Filled 2022-04-27 (×2): qty 1

## 2022-04-27 MED ORDER — LACTATED RINGERS IV BOLUS: 1000.0000 mL | Freq: Once | INTRAVENOUS | Status: DC

## 2022-04-27 NOTE — Assessment & Plan Note (Signed)
Verified DNR/DNI status with patient. 

## 2022-04-27 NOTE — Assessment & Plan Note (Addendum)
Admit to inpatient telemetry. Start decadron 6 mg daily. Start remdesivir as his symptoms have been present for less than 5 days. Check inflammatory markers with crp, ldh, ferritin. Check CBC in AM.  Cannot use Paxlovid since he is on Eliquis. COVID-19 virus infection is a Acute illness/condition that poses a threat to life or bodily function.

## 2022-04-27 NOTE — ED Notes (Signed)
ED TO INPATIENT HANDOFF REPORT  ED Nurse Name and Phone #:   S Name/Age/Gender Mitchell Barnes 76 y.o. male Room/Bed: 031C/031C  Code Status   Code Status: DNR  Home/SNF/Other Home Patient oriented to: self, place, time, and situation Is this baseline? Yes   Triage Complete: Triage complete  Chief Complaint COVID-19 virus infection [U07.1]  Triage Note The pt has had a cough for one week and they removed a tick from his lt knee area about the same time   he was sent here from his regular doctor  low grade temp   last dose tylenol 1300 yesterday   Allergies Allergies  Allergen Reactions   Atorvastatin Other (See Comments)    Muscle cramps   Dilantin [Phenytoin] Itching    Body turns red   Fluconazole Rash    Level of Care/Admitting Diagnosis ED Disposition     ED Disposition  Admit   Condition  --   [place]: [place] [[id number]]  Level of Care: Telemetry Medical [104]  May admit patient to [facility] or [facility] if equivalent level of care is available:: No  Covid Evaluation: Confirmed COVID Positive  Diagnosis: COVID-19 virus infection [id number]  Admitting Physician: Bridgett Larsson [place]  Attending Physician: Bridgett Larsson, ERIC [[id number]]  Estimated length of stay: 3 - 4 days  Certification:: I certify this patient will need inpatient services for at least 2 midnights          B Medical/Surgery History Past Medical History:  Diagnosis Date   Anemia    HLD (hyperlipidemia)    Seizures ([place])    Past Surgical History:  Procedure Laterality Date   EYE SURGERY       A IV Location/Drains/Wounds Patient Lines/Drains/Airways Status     Active Line/Drains/Airways     Name Placement date Placement time Site Days   Peripheral IV 04/27/22 20 G Left Forearm 04/27/22  2007  Forearm  less than 1            Intake/Output Last 24 hours No intake or output data in the 24 hours ending 04/27/22  2251  Labs/Imaging Results for orders placed or performed during the hospital encounter of 04/27/22 (from the past 48 hour(s))  Lactic acid, plasma     Status: None   Collection Time: 04/27/22  5:26 PM  Result Value Ref Range   Lactic Acid, Venous 1.2 0.5 - 1.9 mmol/L    Comment: Performed at [facility], [address] [place], [place] [address]  Comprehensive metabolic panel     Status: Abnormal   Collection Time: 04/27/22  5:26 PM  Result Value Ref Range   Sodium 135 135 - 145 mmol/L   Potassium 4.2 3.5 - 5.1 mmol/L   Chloride 105 98 - 111 mmol/L   CO2 20 (L) 22 - 32 mmol/L   Glucose, Bld 116 (H) 70 - 99 mg/dL    Comment: Glucose reference range applies only to samples taken after fasting for at least 8 hours.   BUN 19 8 - 23 mg/dL   Creatinine, Ser 1.47 (H) 0.61 - 1.24 mg/dL   Calcium 8.5 (L) 8.9 - 10.3 mg/dL   Total Protein 7.0 6.5 - 8.1 g/dL   Albumin 3.4 (L) 3.5 - 5.0 g/dL   AST 54 (H) 15 - 41 U/L   ALT 23 0 - 44 U/L   Alkaline Phosphatase 46 38 - 126 U/L   Total Bilirubin 0.5 0.3 - 1.2 mg/dL  GFR, Estimated 49 (L) >60 mL/min    Comment: (NOTE) Calculated using the CKD-EPI Creatinine Equation (2021)    Anion gap 10 5 - 15    Comment: Performed at [facility], [place] [address], [place], [place] [address]  CBC with Differential     Status: Abnormal   Collection Time: 04/27/22  5:26 PM  Result Value Ref Range   WBC 8.1 4.0 - 10.5 K/uL   RBC 3.93 (L) 4.22 - 5.81 MIL/uL   Hemoglobin 12.4 (L) 13.0 - 17.0 g/dL   HCT 37.3 (L) 39.0 - 52.0 %   MCV 94.9 80.0 - 100.0 fL   MCH 31.6 26.0 - 34.0 pg   MCHC 33.2 30.0 - 36.0 g/dL   RDW 13.1 11.5 - 15.5 %   Platelets 215 150 - 400 K/uL   nRBC 0.0 0.0 - 0.2 %   Neutrophils Relative % 76 %   Neutro Abs 6.1 1.7 - 7.7 K/uL   Lymphocytes Relative 11 %   Lymphs Abs 0.9 0.7 - 4.0 K/uL   Monocytes Relative 12 %   Monocytes Absolute 1.0 0.1 - 1.0 K/uL   Eosinophils Relative 0 %   Eosinophils Absolute 0.0 0.0 - 0.5 K/uL    Basophils Relative 0 %   Basophils Absolute 0.0 0.0 - 0.1 K/uL   Immature Granulocytes 1 %   Abs Immature Granulocytes 0.04 0.00 - 0.07 K/uL    Comment: Performed at [facility] [address] [place], [place] [address]  Protime-INR     Status: None   Collection Time: 04/27/22  5:26 PM  Result Value Ref Range   Prothrombin Time 13.6 11.4 - 15.2 seconds   INR 1.1 0.8 - 1.2    Comment: (NOTE) INR goal varies based on device and disease states. Performed at [facility], [place] [address], [place], [place] [address]   APTT     Status: None   Collection Time: 04/27/22  5:26 PM  Result Value Ref Range   aPTT 29 24 - 36 seconds    Comment: Performed at [facility] [address] [place], [place] [address]  Resp Panel by RT-PCR (Flu A&B, Covid) Anterior Nasal Swab     Status: Abnormal   Collection Time: 04/27/22  7:17 PM   Specimen: Anterior Nasal Swab  Result Value Ref Range   SARS Coronavirus 2 by RT PCR POSITIVE (A) NEGATIVE    Comment: (NOTE) SARS-CoV-2 target nucleic acids are DETECTED.  The SARS-CoV-2 RNA is generally detectable in upper respiratory specimens during the acute phase of infection. Positive results are indicative of the presence of the identified virus, but do not rule out bacterial infection or co-infection with other pathogens not detected by the test. Clinical correlation with patient history and other diagnostic information is necessary to determine patient infection status. The expected result is Negative.  Fact Sheet for Patients: EntrepreneurPulse.com.au  Fact Sheet for Healthcare Providers: IncredibleEmployment.be  This test is not yet approved or cleared by the [place] FDA and  has been authorized for detection and/or diagnosis of SARS-CoV-2 by FDA under an Emergency Use Authorization (EUA).  This EUA will remain in effect (meaning this test can be used) for the duration of  the  COVID-19 declaration under Section 564(b)(1) of the A ct, 21 U.S.C. section 360bbb-3(b)(1), unless the authorization is terminated or revoked sooner.     Influenza A by PCR NEGATIVE NEGATIVE   Influenza B by PCR NEGATIVE NEGATIVE  Comment: (NOTE) The Xpert Xpress SARS-CoV-2/FLU/RSV plus assay is intended as an aid in the diagnosis of influenza from Nasopharyngeal swab specimens and should not be used as a sole basis for treatment. Nasal washings and aspirates are unacceptable for Xpert Xpress SARS-CoV-2/FLU/RSV testing.  Fact Sheet for Patients: EntrepreneurPulse.com.au  Fact Sheet for Healthcare Providers: IncredibleEmployment.be  This test is not yet approved or cleared by the [place] FDA and has been authorized for detection and/or diagnosis of SARS-CoV-2 by FDA under an Emergency Use Authorization (EUA). This EUA will remain in effect (meaning this test can be used) for the duration of the COVID-19 declaration under Section 564(b)(1) of the Act, 21 U.S.C. section 360bbb-3(b)(1), unless the authorization is terminated or revoked.  Performed at [facility], [place] [address], [place], [place] [address]    DG Chest 1 View  Result Date: 04/27/2022 CLINICAL DATA:  Questionable sepsis EXAM: CHEST  1 VIEW COMPARISON:  04/15/2020 FINDINGS: Cardiomegaly. Mild, diffuse bilateral interstitial opacity, similar to prior examination. No new or focal airspace opacity. The visualized skeletal structures are unremarkable. IMPRESSION: Cardiomegaly with mild, diffuse bilateral interstitial opacity, similar to prior examination and likely edema. No new or focal airspace opacity. Electronically Signed   By: Delanna Ahmadi M.D.   On: 04/27/2022 17:53   CT Angio Chest PE W and/or Wo Contrast  Result Date: 04/27/2022 CLINICAL DATA:  Pulmonary embolism suspected, high probability. COVID positive. EXAM: CT ANGIOGRAPHY CHEST WITH CONTRAST TECHNIQUE:  Multidetector CT imaging of the chest was performed using the standard protocol during bolus administration of intravenous contrast. Multiplanar CT image reconstructions and MIPs were obtained to evaluate the vascular anatomy. RADIATION DOSE REDUCTION: This exam was performed according to the departmental dose-optimization program which includes automated exposure control, adjustment of the mA and/or kV according to patient size and/or use of iterative reconstruction technique. CONTRAST:  49mL OMNIPAQUE IOHEXOL 350 MG/ML SOLN COMPARISON:  04/15/2020. FINDINGS: Cardiovascular: The heart is enlarged and scattered coronary artery calcifications are noted. There is a trace pericardial effusion. There is atherosclerotic calcification of the aorta with aneurysmal dilatation of the aortic root measuring 4.4 cm. The pulmonary trunk is normal in caliber. No definite pulmonary artery filling defect. Mediastinum/Nodes: A nonspecific prominent lymph node is present in the right paratracheal space measuring 1.1 cm in short axis diameter. A prominent lymph node is present at the left hilum measuring 1.4 cm. A prominent lymph node is present at the right hilum measuring 1 cm. No axillary lymphadenopathy. The thyroid gland, trachea, and esophagus are within normal limits. Lungs/Pleura: Patchy ground-glass opacities are noted in the lungs bilaterally with a peripheral predominance, compatible with history of atypical pneumonia. No consolidation, effusion, or pneumothorax. Upper Abdomen: Cyst is present in the right kidney. No acute abnormality. Musculoskeletal: Degenerative changes are present in the thoracic spine. No acute or suspicious osseous abnormality. Review of the MIP images confirms the above findings. IMPRESSION: 1. No evidence of pulmonary embolism. 2. Patchy airspace disease in the lungs bilaterally with a peripheral predominance, compatible with history of atypical pneumonia. 3. Cardiomegaly with coronary artery  calcifications. 4. Aortic atherosclerosis with aneurysmal dilatation of the aortic root measuring 4.4 cm. Recommend annual imaging followup by CTA or MRA. This recommendation follows 2010 ACCF/AHA/AATS/ACR/ASA/SCA/SCAI/SIR/STS/SVM Guidelines for the Diagnosis and Management of Patients with Thoracic Aortic Disease. Circulation. 2010; [id number]. Aortic aneurysm NOS (ICD10-I71.9) Electronically Signed   By: Brett Fairy M.D.   On: 04/27/2022 22:06    Pending Labs Unresulted Labs (From admission, onward)  Start     Ordered   04/27/22 2223  C-reactive protein  Add-on,   AD        04/27/22 2222   04/27/22 2223  Lactate dehydrogenase  Add-on,   AD        04/27/22 2222   04/27/22 2223  Ferritin  Add-on,   AD        04/27/22 2222   04/27/22 1715  Blood Culture (routine x 2)  (Undifferentiated presentation (screening labs and basic nursing orders))  BLOOD CULTURE X 2,   STAT      04/27/22 1714   04/27/22 1715  Urinalysis, Routine w reflex microscopic  (Undifferentiated presentation (screening labs and basic nursing orders))  ONCE - URGENT,   URGENT        04/27/22 1714   04/27/22 1715  Urine Culture  (Undifferentiated presentation (screening labs and basic nursing orders))  ONCE - URGENT,   URGENT       Question:  Indication  Answer:  Dysuria   04/27/22 1714   Signed and Held  CBC with Differential/Platelet  Tomorrow morning,   R        Signed and Held   Signed and Held  Comprehensive metabolic panel  Tomorrow morning,   R        Signed and Held   Signed and Held  Magnesium  Tomorrow morning,   R        Signed and Held   Signed and Held  Ferritin  Tomorrow morning,   R        Signed and Held   Signed and Held  Lactate dehydrogenase  Tomorrow morning,   R        Signed and Held   Signed and Held  C-reactive protein  Tomorrow morning,   R        Signed and Held            Vitals/Pain Today's Vitals   04/27/22 2115 04/27/22 2145 04/27/22 2211 04/27/22 2250  BP: 138/68 130/89  115/62   Pulse: 75 (!) 102 94   Resp: 16 16 16    Temp:    99.1 F (37.3 C)  TempSrc:    Oral  SpO2: 99% 97% 95%   Weight:      Height:      PainSc:        Isolation Precautions No active isolations  Medications Medications  remdesivir 200 mg in sodium chloride 0.9% 250 mL IVPB (has no administration in time range)    Followed by  remdesivir 100 mg in sodium chloride 0.9 % 100 mL IVPB (has no administration in time range)  dexamethasone (DECADRON) tablet 6 mg (has no administration in time range)  acetaminophen (TYLENOL) 325 MG tablet (650 mg  Given 04/27/22 1706)  lactated ringers bolus 500 mL (500 mLs Intravenous New Bag/Given 04/27/22 2009)  iohexol (OMNIPAQUE) 350 MG/ML injection 75 mL (75 mLs Intravenous Contrast Given 04/27/22 2148)    Mobility walks Low fall risk   Focused Assessments    R Recommendations: See Admitting Provider Note  Report given to:   Additional Notes:

## 2022-04-27 NOTE — Assessment & Plan Note (Signed)
Stable. Continue crestor. 

## 2022-04-27 NOTE — Assessment & Plan Note (Signed)
Acute. Likely due to Covid-19 infection. Continue with supplemental O2. Pt does not use home O2.

## 2022-04-27 NOTE — ED Notes (Signed)
Pa requested a code sepsis

## 2022-04-27 NOTE — ED Provider Notes (Signed)
[facility] EMERGENCY DEPARTMENT Provider Note   CSN: [id number] Arrival date & time: 04/27/22  1623     History  Chief Complaint  Patient presents with   Cough    Mitchell Barnes is a 76 y.o. male.  HPI Patient is a 76 year old male with a history of seizure disorder, dyslipidemia, anemia, chronic combined systolic and diastolic CHF who presents to the emergency department due to cough and fevers.  Patient was initially admitted to [facility] on May 17 and discharged on May 18.  He was found to have small acute infarcts of the high left frontal and left parieto-occipital cortex on MRI.  Echocardiogram findings at that time were also suspicious for cardiac thrombus.  He was started on aspirin 81 mg for 7 days which was then stopped and patient started on 5 mg Eliquis twice per day which he states he began today.  Since being discharged he states that he has been coughing and having diarrhea.  States that over the past 1 to 2 days the cough became productive.  Also notes that he had a recent tick bite to the left medial thigh on May 19 which he then removed with a knife.  He presented to urgent care on May 22 with pain and redness to the left medial knee.  He was started on doxycycline which he has been taking twice per day as prescribed.  Besides productive cough, fevers, and weakness, patient denies any headaches, nuchal rigidity, chest pain, shortness of breath, abdominal pain, nausea, vomiting, dysuria.  States that his visual changes from his stroke earlier this month have improved significantly but have not completely resolved.  Denies a history of smoking and does not have a home oxygen requirement.  Echocardiogram on Apr 19, 2022 shows an LVEF of 35% to 40%.    Home Medications Prior to Admission medications   Medication Sig Start Date End Date Taking? Authorizing Provider  acetaminophen (TYLENOL) 650 MG CR tablet Take 650 mg by mouth every 8  (eight) hours as needed for pain or fever.    [provider]  apixaban (ELIQUIS) 5 MG TABS tablet Take 1 tablet (5 mg total) by mouth 2 (two) times daily. Do not start this medication until 5/25. 04/26/22   Hollice Espy, MD  doxycycline (VIBRAMYCIN) 100 MG capsule Take 1 capsule (100 mg total) by mouth 2 (two) times daily. 04/23/22   Rodriguez-Southworth, Nettie Elm, PA-C  ferrous sulfate 325 (65 FE) MG tablet Take 325 mg by mouth 2 (two) times a week. Monday, Thursday    [provider]  PHENobarbital (LUMINAL) 32.4 MG tablet Take 64.8 mg by mouth 2 (two) times daily. 04/11/20   [provider]  pravastatin (PRAVACHOL) 80 MG tablet 1 tablet    [provider]  rosuvastatin (CRESTOR) 20 MG tablet Take 1 tablet (20 mg total) by mouth daily. 04/19/22 04/19/23  Hollice Espy, MD  vitamin B-12 (CYANOCOBALAMIN) 500 MCG tablet Take 500 mcg by mouth daily.    [provider]  Vitamin D, Ergocalciferol, (DRISDOL) 1.25 MG (50000 UNIT) CAPS capsule Take 50,000 Units by mouth once a week. 11/29/21   [provider]      Allergies    Atorvastatin, Dilantin [phenytoin], and Fluconazole    Review of Systems   Review of Systems  All other systems reviewed and are negative. Ten systems reviewed and are negative for acute change, except as noted in the HPI.   Physical Exam Updated Vital  Signs BP (!) 134/57   Pulse 72   Temp 99.8 F (37.7 C) (Oral)   Resp 15   Ht 5\' 8"  (1.727 m)   Wt 88.5 kg   SpO2 98%   BMI 29.67 kg/m  Physical Exam Vitals and nursing note reviewed.  Constitutional:      General: He is not in acute distress.    Appearance: Normal appearance. He is not ill-appearing, toxic-appearing or diaphoretic.  HENT:     Head: Normocephalic and atraumatic.     Right Ear: External ear normal.     Left Ear: External ear normal.     Nose: Nose normal.     Mouth/Throat:     Mouth: Mucous membranes are moist.     Pharynx: Oropharynx is  clear. No oropharyngeal exudate or posterior oropharyngeal erythema.  Eyes:     General: No scleral icterus.       Right eye: No discharge.        Left eye: No discharge.     Extraocular Movements: Extraocular movements intact.     Conjunctiva/sclera: Conjunctivae normal.  Cardiovascular:     Rate and Rhythm: Normal rate and regular rhythm.     Pulses: Normal pulses.     Heart sounds: Normal heart sounds. No murmur heard.   No friction rub. No gallop.  Pulmonary:     Effort: Pulmonary effort is normal. No respiratory distress.     Breath sounds: Normal breath sounds. No stridor. No wheezing, rhonchi or rales.     Comments: Lungs are clear to auscultation bilaterally without wheezing, rales, or rhonchi.  Oxygen saturations fluctuating between 79% and 85% on room air.  Patient placed on 2 L via nasal cannula with improvement to 97%. Abdominal:     General: Abdomen is flat.     Palpations: Abdomen is soft.     Tenderness: There is no abdominal tenderness.  Musculoskeletal:        General: Normal range of motion.     Cervical back: Normal range of motion and neck supple. No tenderness.     Right lower leg: Edema present.     Left lower leg: Edema present.     Comments: Trace pitting pedal edema noted bilaterally.  Skin:    General: Skin is warm and dry.  Neurological:     General: No focal deficit present.     Mental Status: He is alert and oriented to person, place, and time.     Comments: Speaking clearly and coherently.  Moving all 4 extremities with ease.  No gross deficits.  A&O x3.  Psychiatric:        Mood and Affect: Mood normal.        Behavior: Behavior normal.   ED Results / Procedures / Treatments   Labs (all labs ordered are listed, but only abnormal results are displayed) Labs Reviewed  RESP PANEL BY RT-PCR (FLU A&B, COVID) ARPGX2 - Abnormal; Notable for the following components:      Result Value   SARS Coronavirus 2 by RT PCR POSITIVE (*)    All other components  within normal limits  COMPREHENSIVE METABOLIC PANEL - Abnormal; Notable for the following components:   CO2 20 (*)    Glucose, Bld 116 (*)    Creatinine, Ser 1.47 (*)    Calcium 8.5 (*)    Albumin 3.4 (*)    AST 54 (*)    GFR, Estimated 49 (*)    All other components within normal limits  CBC WITH DIFFERENTIAL/PLATELET - Abnormal; Notable for the following components:   RBC 3.93 (*)    Hemoglobin 12.4 (*)    HCT 37.3 (*)    All other components within normal limits  CULTURE, BLOOD (ROUTINE X 2)  CULTURE, BLOOD (ROUTINE X 2)  URINE CULTURE  LACTIC ACID, PLASMA  PROTIME-INR  APTT  LACTIC ACID, PLASMA  URINALYSIS, ROUTINE W REFLEX MICROSCOPIC   EKG EKG Interpretation  Date/Time:  Friday Apr 27 2022 19:26:33 EDT Ventricular Rate:  82 PR Interval:  151 QRS Duration: 143 QT Interval:  439 QTC Calculation: 513 R Axis:   -72 Text Interpretation: Sinus rhythm RBBB and LAFB Abnormal T, consider ischemia, lateral leads similar to earlier in the day Confirmed by Pricilla Loveless [phone]) on 04/27/2022 7:37:04 PM  Radiology DG Chest 1 View  Result Date: 04/27/2022 CLINICAL DATA:  Questionable sepsis EXAM: CHEST  1 VIEW COMPARISON:  04/15/2020 FINDINGS: Cardiomegaly. Mild, diffuse bilateral interstitial opacity, similar to prior examination. No new or focal airspace opacity. The visualized skeletal structures are unremarkable. IMPRESSION: Cardiomegaly with mild, diffuse bilateral interstitial opacity, similar to prior examination and likely edema. No new or focal airspace opacity. Electronically Signed   By: Jearld Lesch M.D.   On: 04/27/2022 17:53    Procedures Procedures   Medications Ordered in ED Medications  acetaminophen (TYLENOL) 325 MG tablet (650 mg  Given 04/27/22 1706)  lactated ringers bolus 500 mL (500 mLs Intravenous New Bag/Given 04/27/22 2009)   ED Course/ Medical Decision Making/ A&P Clinical Course as of 04/27/22 2120  Fri Apr 27, 2022  2102 SARS Coronavirus 2 by  RT PCR(!): POSITIVE [LJ]    Clinical Course User Index [LJ] Placido Sou, PA-C                           Medical Decision Making Amount and/or Complexity of Data Reviewed Labs:  Decision-making details documented in ED Course.  Risk Decision regarding hospitalization.  Pt is a 76 y.o. male with a complex medical history who presents to the emergency department due to cough, fevers, weakness.  Symptoms started about 6 days ago.  Labs: CBC with RBCs at 3.93, hemoglobin 12.4, hematocrit 37.3. CMP with a CO2 of 20, glucose of 116, creatinine 1.47, calcium of 8.5, albumin 3.4, AST 54, GFR 49. PT/INR and APTT within normal limits. Lactic acid 1.2. Respiratory panel is positive for COVID-19. Blood cultures obtained. UA is pending.  Imaging: Chest x-ray shows cardiomegaly with mild, diffuse bilateral interstitial opacities, similar to prior examination and likely edema.  No new or focal airspace opacity.  I, Placido Sou, PA-C, personally reviewed and evaluated these images and lab results as part of my medical decision-making.  Lab work concerning for COVID-19.  States that he has been vaccinated x2 and boosted twice.  No known history of previous COVID-19 infection.  Patient persistently hypoxic with saturations ranging from 79% to 85% on room air while lying in bed.  He was placed on 2 L via nasal cannula and his saturations have remained in the high 90s.  Given patient's new COVID-19 diagnosis, complex medical history, as well as his hypoxia and new oxygen requirement, feel that he will require admission to medicine for further management.  This was discussed with the patient who is agreeable.  Patient discussed with the hospitalist team who will accept the patient for this time.  They requested patient receive a CTA of the chest.  This has been ordered.  Note: Portions of this report may have been transcribed using voice recognition software. Every effort was made to ensure  accuracy; however, inadvertent computerized transcription errors may be present.   Final Clinical Impression(s) / ED Diagnoses Final diagnoses:  Acute hypoxemic respiratory failure due to COVID-19 [facility])   Rx / DC Orders ED Discharge Orders     None         Placido Sou, PA-C 04/27/22 2121    Pricilla Loveless, MD 04/29/22 [phone]

## 2022-04-27 NOTE — ED Notes (Signed)
Oral order for tylenol 650 mg given

## 2022-04-27 NOTE — Assessment & Plan Note (Signed)
Euvolemic to slightly hypovolemic. Getting IVF bolus in ER due to low BP.

## 2022-04-27 NOTE — Assessment & Plan Note (Signed)
Recent. Discharged on ASA, Eliquis.  Per DC summary, cardiology could not rule out ventricular mural thrombus and wanted to proceed with full anticoagulation.

## 2022-04-27 NOTE — Assessment & Plan Note (Addendum)
Stable. Continue with phenobarbital 32,4 mg

## 2022-04-27 NOTE — ED Provider Triage Note (Signed)
Emergency Medicine Provider Triage Evaluation Note  Mitchell Barnes , a 76 y.o. male  was evaluated in triage.  Pt complains of fever, malaise, difficulty sleeping, pain of left knee with redness, rash after tick bite.  Patient reports that he has been taking doxycycline for tick bite sustained 1 week ago.  Patient denies chest pain but does endorse some coughing, shortness of breath.  No previous history of tobacco abuse, COPD, heart disease.  Review of Systems  Positive: Shortness of breath, cough, leg wound, fever Negative: Chest pain  Physical Exam  BP 125/74   Pulse 93   Temp (!) 102.8 F (39.3 C)   Resp 20   Ht 5\' 8"  (1.727 m)   Wt 88.5 kg   SpO2 93%   BMI 29.67 kg/m  Gen:   Awake, no distress   Resp:  Slightly increased respiratory effort, some crackles, rhonchi noted MSK:   Moves extremities without difficulty  Other:  Wound noted on left knee with surrounding erythema, some redness and tenderness.  No purulent drainage noted.  No significant joint effusion noted.  Medical Decision Making  Medically screening exam initiated at 5:15 PM.  Appropriate orders placed.  Charland was informed that the remainder of the evaluation will be completed by another provider, this initial triage assessment does not replace that evaluation, and the importance of remaining in the ED until their evaluation is complete.  Patient meets sepsis criteria with temperature of 102.8, heart rate over 90, and suspected source of infection either respiratory or leg wound.  Code sepsis activated, charge nurse informed.   Tawni Pummel, Olene Floss 04/27/22 1718

## 2022-04-27 NOTE — Assessment & Plan Note (Signed)
Unstable. SBP is low in ER. Getting IVF. Hold any HTN meds for now.

## 2022-04-27 NOTE — ED Triage Notes (Signed)
The pt has had a cough for one week and they removed a tick from his lt knee area about the same time   he was sent here from his regular doctor  low grade temp   last dose tylenol 1300 yesterday

## 2022-04-27 NOTE — Subjective & Objective (Signed)
CC: cough, SOB HPI: 76 year old male history of chronic systolic/diastolic heart failure EF of 35%, recent stroke this month, questionable mural thrombus on Eliquis, hyperlipidemia presents to the ER today with 3 to 4-day history of cough and worsening shortness of breath.  He was seen in the ER 2 days ago for tick bite.  He states that he was discharged from the hospital on a Thursday evening, he noticed some redness to his left thigh.  By Friday he had a rash.  He states that his wife dug out a tick out of his leg.  Patient states that his wife has pictures and they identified the tick as a [place] tick.  He was seen in the ER and started on doxycycline.  Patient is noted to cough for several days.  He has had his COVID vaccinations.  Came to the ER feeling poorly.  On arrival patient was febrile temp 102.8 heart rate 93 blood pressure 125/74 satting 81% on room air.  Started on supplemental oxygen.  Labs: COVID-positive, influenza negative Sodium 135, potassium 4.2, BUN of 19, creatinine 1.47  White count 8.1, hemoglobin 12.4  EKG which I personally reviewed and interpreted shows bifascicular block, normal sinus rhythm.  CTPA negative for PE.  There is patchy airspace disease peripherally consistent with COVID-pneumonia.  Due to patient's COVID-pneumonia, acute hypoxic respiratory failure, [facility] hospitalist contacted for admission.

## 2022-04-27 NOTE — H&P (Signed)
History and Physical    Mitchell Barnes [id number] DOB: [birth year] DOA: 04/27/2022  DOS: the patient was seen and examined on 04/27/2022  PCP: Leeroy Cha, MD   Patient coming from: Clinic  I have personally briefly reviewed patient's old medical records in [place]  CC: cough, SOB HPI: 76 year old male history of chronic systolic/diastolic heart failure EF of 35%, recent stroke this month, questionable mural thrombus on Eliquis, hyperlipidemia presents to the ER today with 3 to 4-day history of cough and worsening shortness of breath.  He was seen in the ER 2 days ago for tick bite.  He states that he was discharged from the hospital on a Thursday evening, he noticed some redness to his left thigh.  By Friday he had a rash.  He states that his wife dug out a tick out of his leg.  Patient states that his wife has pictures and they identified the tick as a [place] tick.  He was seen in the ER and started on doxycycline.  Patient is noted to cough for several days.  He has had his COVID vaccinations.  Came to the ER feeling poorly.  On arrival patient was febrile temp 102.8 heart rate 93 blood pressure 125/74 satting 81% on room air.  Started on supplemental oxygen.  Labs: COVID-positive, influenza negative Sodium 135, potassium 4.2, BUN of 19, creatinine 1.47  White count 8.1, hemoglobin 12.4  EKG which I personally reviewed and interpreted shows bifascicular block, normal sinus rhythm.  CTPA negative for PE.  There is patchy airspace disease peripherally consistent with COVID-pneumonia.  Due to patient's COVID-pneumonia, acute hypoxic respiratory failure, [facility] hospitalist contacted for admission.     ED Course: COVID-19 positive.  CTPA negative for PE.  Room air saturations 81%.  Review of Systems:  Review of Systems  Constitutional:  Positive for chills, fever and malaise/fatigue.  HENT: Negative.    Eyes: Negative.   Respiratory:  Positive  for cough and shortness of breath.   Cardiovascular: Negative.   Gastrointestinal: Negative.  Negative for abdominal pain, diarrhea and vomiting.  Genitourinary: Negative.   Musculoskeletal:  Positive for myalgias.  Skin:  Positive for rash.  Neurological: Negative.   Endo/Heme/Allergies: Negative.   Psychiatric/Behavioral: Negative.    All other systems reviewed and are negative.  Past Medical History:  Diagnosis Date   Anemia    HLD (hyperlipidemia)    Seizures (HCC)     Past Surgical History:  Procedure Laterality Date   EYE SURGERY       reports that he has never smoked. He has never used smokeless tobacco. He reports current alcohol use. He reports that he does not use drugs.  Allergies  Allergen Reactions   Atorvastatin Other (See Comments)    Muscle cramps   Dilantin [Phenytoin] Itching    Body turns red   Fluconazole Rash    Family History  Problem Relation Age of Onset   Heart attack Father     Prior to Admission medications   Medication Sig Start Date End Date Taking? Authorizing Provider  acetaminophen (TYLENOL) 650 MG CR tablet Take 650 mg by mouth every 8 (eight) hours as needed for pain or fever.   Yes [provider]  apixaban (ELIQUIS) 5 MG TABS tablet Take 1 tablet (5 mg total) by mouth 2 (two) times daily. Do not start this medication until 5/25. 04/26/22  Yes Annita Brod, MD  diphenhydrAMINE (BENADRYL) 25 MG tablet Take 25 mg by mouth every  6 (six) hours as needed for itching.   Yes [provider]  doxycycline (VIBRAMYCIN) 100 MG capsule Take 1 capsule (100 mg total) by mouth 2 (two) times daily. Patient taking differently: Take 100 mg by mouth See admin instructions. Bid x 10 days 04/23/22  Yes Rodriguez-Southworth, Sandrea Matte  ferrous sulfate 325 (65 FE) MG tablet Take 325 mg by mouth every Monday, Wednesday, and Friday. Monday, Thursday   Yes [provider]  PHENobarbital (LUMINAL) 32.4 MG tablet Take 64.8 mg by  mouth 2 (two) times daily. 04/11/20  Yes [provider]  rosuvastatin (CRESTOR) 20 MG tablet Take 1 tablet (20 mg total) by mouth daily. 04/19/22 04/19/23 Yes Annita Brod, MD  vitamin B-12 (CYANOCOBALAMIN) 500 MCG tablet Take 500 mcg by mouth daily.   Yes [provider]  Vitamin D, Ergocalciferol, (DRISDOL) 1.25 MG (50000 UNIT) CAPS capsule Take 50,000 Units by mouth every Tuesday. 11/29/21  Yes [provider]    Physical Exam: Vitals:   04/27/22 2045 04/27/22 2115 04/27/22 2145 04/27/22 2211  BP: (!) 134/57 138/68 130/89 115/62  Pulse: 72 75 (!) 102 94  Resp: 15 16 16 16   Temp:      TempSrc:      SpO2: 98% 99% 97% 95%  Weight:      Height:        Physical Exam Vitals and nursing note reviewed.  Constitutional:      General: He is not in acute distress.    Appearance: He is not ill-appearing, toxic-appearing or diaphoretic.  HENT:     Head: Normocephalic and atraumatic.     Nose: Nose normal. No rhinorrhea.  Cardiovascular:     Rate and Rhythm: Regular rhythm. Tachycardia present.  Pulmonary:     Effort: No respiratory distress.     Breath sounds: Examination of the right-lower field reveals rales. Examination of the left-lower field reveals rales. Rales present.  Abdominal:     General: Bowel sounds are normal. There is no distension.     Tenderness: There is no abdominal tenderness. There is no guarding or rebound.  Musculoskeletal:     Right lower leg: No edema.     Left lower leg: No edema.  Skin:    General: Skin is warm and dry.     Capillary Refill: Capillary refill takes less than 2 seconds.     Findings: Rash present.     Comments: See picture of left thigh rash  Neurological:     General: No focal deficit present.     Mental Status: He is alert and oriented to person, place, and time.        Labs on Admission: I have personally reviewed following labs and imaging studies  CBC: Recent Labs  Lab 04/27/22 1726  WBC 8.1   NEUTROABS 6.1  HGB 12.4*  HCT 37.3*  MCV 94.9  PLT 123456   Basic Metabolic Panel: Recent Labs  Lab 04/27/22 1726  NA 135  K 4.2  CL 105  CO2 20*  GLUCOSE 116*  BUN 19  CREATININE 1.47*  CALCIUM 8.5*   GFR: Estimated Creatinine Clearance: 46.9 mL/min (A) (by C-G formula based on SCr of 1.47 mg/dL (H)). Liver Function Tests: Recent Labs  Lab 04/27/22 1726  AST 54*  ALT 23  ALKPHOS 46  BILITOT 0.5  PROT 7.0  ALBUMIN 3.4*   No results for input(s): LIPASE, AMYLASE in the last 168 hours. No results for input(s): AMMONIA in the last 168 hours.  Coagulation Profile: Recent Labs  Lab 04/27/22 1726  INR 1.1   Cardiac Enzymes: No results for input(s): CKTOTAL, CKMB, CKMBINDEX, TROPONINI, TROPONINIHS in the last 168 hours. BNP (last 3 results) No results for input(s): PROBNP in the last 8760 hours. HbA1C: No results for input(s): HGBA1C in the last 72 hours. CBG: No results for input(s): GLUCAP in the last 168 hours. Lipid Profile: No results for input(s): CHOL, HDL, LDLCALC, TRIG, CHOLHDL, LDLDIRECT in the last 72 hours. Thyroid Function Tests: No results for input(s): TSH, T4TOTAL, FREET4, T3FREE, THYROIDAB in the last 72 hours. Anemia Panel: No results for input(s): VITAMINB12, FOLATE, FERRITIN, TIBC, IRON, RETICCTPCT in the last 72 hours. Urine analysis: No results found for: COLORURINE, APPEARANCEUR, LABSPEC, [place], GLUCOSEU, [place], BILIRUBINUR, KETONESUR, PROTEINUR, UROBILINOGEN, NITRITE, LEUKOCYTESUR  Radiological Exams on Admission: I have personally reviewed images DG Chest 1 View  Result Date: 04/27/2022 CLINICAL DATA:  Questionable sepsis EXAM: CHEST  1 VIEW COMPARISON:  04/15/2020 FINDINGS: Cardiomegaly. Mild, diffuse bilateral interstitial opacity, similar to prior examination. No new or focal airspace opacity. The visualized skeletal structures are unremarkable. IMPRESSION: Cardiomegaly with mild, diffuse bilateral interstitial opacity, similar to prior  examination and likely edema. No new or focal airspace opacity. Electronically Signed   By: Delanna Ahmadi M.D.   On: 04/27/2022 17:53   CT Angio Chest PE W and/or Wo Contrast  Result Date: 04/27/2022 CLINICAL DATA:  Pulmonary embolism suspected, high probability. COVID positive. EXAM: CT ANGIOGRAPHY CHEST WITH CONTRAST TECHNIQUE: Multidetector CT imaging of the chest was performed using the standard protocol during bolus administration of intravenous contrast. Multiplanar CT image reconstructions and MIPs were obtained to evaluate the vascular anatomy. RADIATION DOSE REDUCTION: This exam was performed according to the departmental dose-optimization program which includes automated exposure control, adjustment of the mA and/or kV according to patient size and/or use of iterative reconstruction technique. CONTRAST:  26mL OMNIPAQUE IOHEXOL 350 MG/ML SOLN COMPARISON:  04/15/2020. FINDINGS: Cardiovascular: The heart is enlarged and scattered coronary artery calcifications are noted. There is a trace pericardial effusion. There is atherosclerotic calcification of the aorta with aneurysmal dilatation of the aortic root measuring 4.4 cm. The pulmonary trunk is normal in caliber. No definite pulmonary artery filling defect. Mediastinum/Nodes: A nonspecific prominent lymph node is present in the right paratracheal space measuring 1.1 cm in short axis diameter. A prominent lymph node is present at the left hilum measuring 1.4 cm. A prominent lymph node is present at the right hilum measuring 1 cm. No axillary lymphadenopathy. The thyroid gland, trachea, and esophagus are within normal limits. Lungs/Pleura: Patchy ground-glass opacities are noted in the lungs bilaterally with a peripheral predominance, compatible with history of atypical pneumonia. No consolidation, effusion, or pneumothorax. Upper Abdomen: Cyst is present in the right kidney. No acute abnormality. Musculoskeletal: Degenerative changes are present in the  thoracic spine. No acute or suspicious osseous abnormality. Review of the MIP images confirms the above findings. IMPRESSION: 1. No evidence of pulmonary embolism. 2. Patchy airspace disease in the lungs bilaterally with a peripheral predominance, compatible with history of atypical pneumonia. 3. Cardiomegaly with coronary artery calcifications. 4. Aortic atherosclerosis with aneurysmal dilatation of the aortic root measuring 4.4 cm. Recommend annual imaging followup by CTA or MRA. This recommendation follows 2010 ACCF/AHA/AATS/ACR/ASA/SCA/SCAI/SIR/STS/SVM Guidelines for the Diagnosis and Management of Patients with Thoracic Aortic Disease. Circulation. 2010; [id number]. Aortic aneurysm NOS (ICD10-I71.9) Electronically Signed   By: Brett Fairy M.D.   On: 04/27/2022 22:06    EKG: My personal interpretation of  EKG shows: NSR, bifascicular block    Assessment/Plan Principal Problem:   COVID-19 virus infection Active Problems:   Acute respiratory failure with hypoxia (HCC)   Tick bite of left thigh with local reaction   Seizure (HCC)   Hyperlipidemia   Essential hypertension   CVA (cerebral vascular accident) ([place])   Chronic combined systolic and diastolic CHF (congestive heart failure) ([place])   DNR (do not resuscitate)/DNI(Do Not Intubate)    Assessment and Plan: * COVID-19 virus infection Admit to inpatient telemetry. Start decadron 6 mg daily. Start remdesivir as his symptoms have been present for less than 5 days. Check inflammatory markers with crp, ldh, ferritin. Check CBC in AM.  Cannot use Paxlovid since he is on Eliquis. COVID-19 virus infection is a Acute illness/condition that poses a threat to life or bodily function.   Tick bite of left thigh with local reaction Acute. Was seen in ER on 04-25-2022 and started on po doxycycline. Continue with doxycyline 100 mg bid. Pt states he is sure it was a [place] tick. He states his wife has a picture of it.        Acute  respiratory failure with hypoxia (HCC) Acute. Likely due to Covid-19 infection. Continue with supplemental O2. Pt does not use home O2.  Chronic combined systolic and diastolic CHF (congestive heart failure) (HCC) Euvolemic to slightly hypovolemic. Getting IVF bolus in ER due to low BP.  CVA (cerebral vascular accident) ([place]) Recent. Discharged on ASA, Eliquis.  Per DC summary, cardiology could not rule out ventricular mural thrombus and wanted to proceed with full anticoagulation.  Essential hypertension Unstable. SBP is low in ER. Getting IVF. Hold any HTN meds for now.  Hyperlipidemia Stable. Continue crestor.  Seizure ([place]) Stable. Continue with phenobarbital 32,4 mg  DNR (do not resuscitate)/DNI(Do Not Intubate) Verified DNR/DNI status with patient.   DVT prophylaxis: Eliquis Code Status: DNR/DNI(Do NOT Intubate)verified with patient Family Communication: no family at bedside  Disposition Plan: return home  Consults called: none  Admission status: Inpatient, Telemetry bed   Kristopher Oppenheim, DO [facility] 04/27/2022, 10:21 PM  ]

## 2022-04-27 NOTE — Assessment & Plan Note (Signed)
Acute. Was seen in ER on 04-25-2022 and started on po doxycycline. Continue with doxycyline 100 mg bid. Pt states he is sure it was a [place] tick. He states his wife has a picture of it.

## 2022-04-28 ENCOUNTER — Encounter (HOSPITAL_COMMUNITY): Payer: Self-pay | Admitting: Internal Medicine

## 2022-04-28 DIAGNOSIS — A770 Spotted fever due to Rickettsia rickettsii: Secondary | ICD-10-CM

## 2022-04-28 DIAGNOSIS — N1831 Chronic kidney disease, stage 3a: Secondary | ICD-10-CM

## 2022-04-28 DIAGNOSIS — N183 Chronic kidney disease, stage 3 unspecified: Secondary | ICD-10-CM

## 2022-04-28 DIAGNOSIS — I700 Atherosclerosis of aorta: Secondary | ICD-10-CM

## 2022-04-28 HISTORY — DX: Atherosclerosis of aorta: I70.0

## 2022-04-28 LAB — COMPREHENSIVE METABOLIC PANEL
ALT: 23 U/L (ref 0–44)
AST: 53 U/L — ABNORMAL HIGH (ref 15–41)
Albumin: 2.9 g/dL — ABNORMAL LOW (ref 3.5–5.0)
Alkaline Phosphatase: 40 U/L (ref 38–126)
Anion gap: 10 (ref 5–15)
BUN: 17 mg/dL (ref 8–23)
CO2: 21 mmol/L — ABNORMAL LOW (ref 22–32)
Calcium: 8.1 mg/dL — ABNORMAL LOW (ref 8.9–10.3)
Chloride: 103 mmol/L (ref 98–111)
Creatinine, Ser: 1.29 mg/dL — ABNORMAL HIGH (ref 0.61–1.24)
GFR, Estimated: 58 mL/min — ABNORMAL LOW (ref >60)
Glucose, Bld: 97 mg/dL (ref 70–99)
Potassium: 4.1 mmol/L (ref 3.5–5.1)
Sodium: 134 mmol/L — ABNORMAL LOW (ref 135–145)
Total Bilirubin: 0.5 mg/dL (ref 0.3–1.2)
Total Protein: 6.2 g/dL — ABNORMAL LOW (ref 6.5–8.1)

## 2022-04-28 LAB — URINALYSIS, ROUTINE W REFLEX MICROSCOPIC
Bacteria, UA: NONE SEEN
Bilirubin Urine: NEGATIVE
Glucose, UA: NEGATIVE mg/dL
Ketones, ur: 5 mg/dL — AB
Leukocytes,Ua: NEGATIVE
Nitrite: NEGATIVE
Protein, ur: 30 mg/dL — AB
Specific Gravity, Urine: 1.018 (ref 1.005–1.030)
pH: 5 (ref 5.0–8.0)

## 2022-04-28 LAB — CBC WITH DIFFERENTIAL/PLATELET
Abs Immature Granulocytes: 0.02 10*3/uL (ref 0.00–0.07)
Basophils Absolute: 0 10*3/uL (ref 0.0–0.1)
Basophils Relative: 0 %
Eosinophils Absolute: 0 10*3/uL (ref 0.0–0.5)
Eosinophils Relative: 0 %
HCT: 34.6 % — ABNORMAL LOW (ref 39.0–52.0)
Hemoglobin: 11.6 g/dL — ABNORMAL LOW (ref 13.0–17.0)
Immature Granulocytes: 0 %
Lymphocytes Relative: 24 %
Lymphs Abs: 1.9 10*3/uL (ref 0.7–4.0)
MCH: 31.1 pg (ref 26.0–34.0)
MCHC: 33.5 g/dL (ref 30.0–36.0)
MCV: 92.8 fL (ref 80.0–100.0)
Monocytes Absolute: 1.1 10*3/uL — ABNORMAL HIGH (ref 0.1–1.0)
Monocytes Relative: 14 %
Neutro Abs: 4.7 10*3/uL (ref 1.7–7.7)
Neutrophils Relative %: 62 %
Platelets: 182 10*3/uL (ref 150–400)
RBC: 3.73 MIL/uL — ABNORMAL LOW (ref 4.22–5.81)
RDW: 13.1 % (ref 11.5–15.5)
WBC: 7.7 10*3/uL (ref 4.0–10.5)
nRBC: 0 % (ref 0.0–0.2)

## 2022-04-28 LAB — LACTATE DEHYDROGENASE
LDH: 371 U/L — ABNORMAL HIGH (ref 98–192)
LDH: 230 U/L — ABNORMAL HIGH (ref 98–192)

## 2022-04-28 LAB — C-REACTIVE PROTEIN
CRP: 7.2 mg/dL — ABNORMAL HIGH (ref <1.0)
CRP: 8.5 mg/dL — ABNORMAL HIGH (ref <1.0)

## 2022-04-28 LAB — FERRITIN
Ferritin: 169 ng/mL (ref 24–336)
Ferritin: 178 ng/mL (ref 24–336)

## 2022-04-28 LAB — MRSA NEXT GEN BY PCR, NASAL: MRSA by PCR Next Gen: DETECTED — AB

## 2022-04-28 LAB — MAGNESIUM: Magnesium: 2.2 mg/dL (ref 1.7–2.4)

## 2022-04-28 MED ORDER — WARFARIN SODIUM 5 MG PO TABS
5.0000 mg | ORAL_TABLET | Freq: Once | ORAL | Status: AC
  Administered 2022-04-28: 5 mg via ORAL
  Filled 2022-04-28: qty 1

## 2022-04-28 MED ORDER — GUAIFENESIN-DM 100-10 MG/5ML PO SYRP
15.0000 mL | ORAL_SOLUTION | ORAL | Status: DC | PRN
  Administered 2022-04-29: 15 mL via ORAL
  Filled 2022-04-28: qty 15

## 2022-04-28 MED ORDER — MUPIROCIN 2 % EX OINT
1.0000 "application " | TOPICAL_OINTMENT | Freq: Two times a day (BID) | CUTANEOUS | Status: DC
  Administered 2022-04-28 – 2022-04-29 (×3): 1 via NASAL
  Filled 2022-04-28: qty 22

## 2022-04-28 MED ORDER — ROSUVASTATIN CALCIUM 20 MG PO TABS
20.0000 mg | ORAL_TABLET | Freq: Every day | ORAL | Status: DC
  Administered 2022-04-28 – 2022-04-29 (×2): 20 mg via ORAL
  Filled 2022-04-28 (×2): qty 1

## 2022-04-28 MED ORDER — ALBUTEROL SULFATE (2.5 MG/3ML) 0.083% IN NEBU: 2.5000 mg | INHALATION_SOLUTION | RESPIRATORY_TRACT | Status: DC | PRN

## 2022-04-28 MED ORDER — ACETAMINOPHEN 650 MG RE SUPP: 650.0000 mg | Freq: Four times a day (QID) | RECTAL | Status: DC | PRN

## 2022-04-28 MED ORDER — WARFARIN - PHARMACIST DOSING INPATIENT: Freq: Every day | Status: DC

## 2022-04-28 MED ORDER — ONDANSETRON HCL 4 MG/2ML IJ SOLN: 4.0000 mg | Freq: Four times a day (QID) | INTRAMUSCULAR | Status: DC | PRN

## 2022-04-28 MED ORDER — ENOXAPARIN SODIUM 80 MG/0.8ML IJ SOSY: 80.0000 mg | PREFILLED_SYRINGE | Freq: Two times a day (BID) | INTRAMUSCULAR | Status: DC

## 2022-04-28 MED ORDER — ONDANSETRON HCL 4 MG PO TABS: 4.0000 mg | ORAL_TABLET | Freq: Four times a day (QID) | ORAL | Status: DC | PRN

## 2022-04-28 MED ORDER — PHENOBARBITAL 32.4 MG PO TABS
64.8000 mg | ORAL_TABLET | Freq: Two times a day (BID) | ORAL | Status: DC
  Administered 2022-04-28 – 2022-04-29 (×3): 64.8 mg via ORAL
  Filled 2022-04-28 (×3): qty 2

## 2022-04-28 MED ORDER — APIXABAN 5 MG PO TABS
5.0000 mg | ORAL_TABLET | Freq: Two times a day (BID) | ORAL | Status: DC
  Administered 2022-04-28: 5 mg via ORAL
  Filled 2022-04-28: qty 1

## 2022-04-28 MED ORDER — CHLORHEXIDINE GLUCONATE CLOTH 2 % EX PADS
6.0000 | MEDICATED_PAD | Freq: Every day | CUTANEOUS | Status: DC
  Administered 2022-04-28 – 2022-04-29 (×2): 6 via TOPICAL

## 2022-04-28 MED ORDER — ACETAMINOPHEN 325 MG PO TABS
650.0000 mg | ORAL_TABLET | Freq: Four times a day (QID) | ORAL | Status: DC | PRN
  Administered 2022-04-29: 650 mg via ORAL
  Filled 2022-04-28: qty 2

## 2022-04-28 MED ORDER — DOXYCYCLINE HYCLATE 100 MG PO TABS
100.0000 mg | ORAL_TABLET | Freq: Two times a day (BID) | ORAL | Status: DC
  Administered 2022-04-28 – 2022-04-29 (×3): 100 mg via ORAL
  Filled 2022-04-28 (×3): qty 1

## 2022-04-28 MED ORDER — ENOXAPARIN SODIUM 80 MG/0.8ML IJ SOSY
80.0000 mg | PREFILLED_SYRINGE | Freq: Two times a day (BID) | INTRAMUSCULAR | Status: DC
  Administered 2022-04-28 – 2022-04-29 (×2): 80 mg via SUBCUTANEOUS
  Filled 2022-04-28 (×3): qty 0.8

## 2022-04-28 NOTE — Progress Notes (Signed)
  Transition of Care (TOC) Screening Note   Patient Details  Name: Mitchell Barnes Date of Birth: 1945-12-20   Transition of Care [facility]) CM/SW Contact:    Patrice Paradise, LCSW Phone Number: 04/28/2022, 3:32 PM    Transition of Care Department [facility]) has reviewed patient and no TOC needs have been identified at this time. We will continue to monitor patient advancement through interdisciplinary progression rounds. If new patient transition needs arise, please place a TOC consult.

## 2022-04-28 NOTE — Hospital Course (Signed)
76 year old man recent hospitalization for stroke, possible mural thrombus, discharged on apixaban; chronic systolic diastolic CHF, presenting with cough and shortness of breath.  Admitted for acute hypoxic respiratory failure secondary to COVID-19.  Incidental note made of rash consistent with STARI.

## 2022-04-28 NOTE — Progress Notes (Addendum)
ANTICOAGULATION CONSULT NOTE - Initial Consult  Pharmacy Consult for coumadin/Lovenox Indication: stroke/mural thrombus  Allergies  Allergen Reactions   Atorvastatin Other (See Comments)    Muscle cramps   Dilantin [Phenytoin] Itching    Body turns red   Fluconazole Rash    Patient Measurements: Height: 5\' 8"  (172.7 cm) Weight: 81.8 kg (180 lb 5.4 oz) IBW/kg (Calculated) : 68.4 Heparin Dosing Weight: 82kg  Vital Signs: Temp: 98.2 F (36.8 C) (05/27 1538) Temp Source: Oral (05/27 1538) BP: 107/58 (05/27 1538) Pulse Rate: 54 (05/27 1538)  Labs: Recent Labs    04/27/22 1726 04/28/22 0618  HGB 12.4* 11.6*  HCT 37.3* 34.6*  PLT 215 182  APTT 29  --   LABPROT 13.6  --   INR 1.1  --   CREATININE 1.47* 1.29*    Estimated Creatinine Clearance: 47.9 mL/min (A) (by C-G formula based on SCr of 1.29 mg/dL (H)).   Medical History: Past Medical History:  Diagnosis Date   Anemia    Aortic atherosclerosis ([place]) 04/28/2022   HLD (hyperlipidemia)    Seizures (HCC)     Medications:  Medications Prior to Admission  Medication Sig Dispense Refill Last Dose   acetaminophen (TYLENOL) 650 MG CR tablet Take 650 mg by mouth every 8 (eight) hours as needed for pain or fever.   04/26/2022   apixaban (ELIQUIS) 5 MG TABS tablet Take 1 tablet (5 mg total) by mouth 2 (two) times daily. Do not start this medication until 5/25. 60 tablet 2 04/27/2022 at 0900   diphenhydrAMINE (BENADRYL) 25 MG tablet Take 25 mg by mouth every 6 (six) hours as needed for itching.   04/27/2022   doxycycline (VIBRAMYCIN) 100 MG capsule Take 1 capsule (100 mg total) by mouth 2 (two) times daily. (Patient taking differently: Take 100 mg by mouth See admin instructions. Bid x 10 days) 20 capsule 0 04/27/2022   ferrous sulfate 325 (65 FE) MG tablet Take 325 mg by mouth every Monday, Wednesday, and Friday. Monday, Thursday   04/27/2022   PHENobarbital (LUMINAL) 32.4 MG tablet Take 64.8 mg by mouth 2 (two) times daily.    04/27/2022   rosuvastatin (CRESTOR) 20 MG tablet Take 1 tablet (20 mg total) by mouth daily. 30 tablet 11 04/27/2022   vitamin B-12 (CYANOCOBALAMIN) 500 MCG tablet Take 500 mcg by mouth daily.   04/27/2022   Vitamin D, Ergocalciferol, (DRISDOL) 1.25 MG (50000 UNIT) CAPS capsule Take 50,000 Units by mouth every Tuesday.   04/24/2022   Scheduled:   Chlorhexidine Gluconate Cloth  6 each Topical Q0600   dexamethasone  6 mg Oral Daily   doxycycline  100 mg Oral BID   mupirocin ointment  1 application. Nasal BID   PHENobarbital  64.8 mg Oral BID   rosuvastatin  20 mg Oral Daily   warfarin  5 mg Oral ONCE-1600   [START ON 04/29/2022] Warfarin - Pharmacist Dosing Inpatient   Does not apply q1600   Infusions:   remdesivir 100 mg in NS 100 mL 100 mg (04/28/22 1102)    Assessment: Pt was recent admitted/discharged for CVA with possibility of mural thrombus. He was placed on apixaban. However, he is also on phenobarbital witch is a major inducer of liver enzymes and p-gp. This would ultimately reduced apixaban level severely. D/w Dr. Sarajane Jews and we will transition to coumadin so we can adjust INR.  INR 1  Addendum  Ok to bridge with lovenox due to likelihood of mural thrombus.  Goal of Therapy:  INR 2-3 Anti-Xa 0.6-1 Monitor platelets by anticoagulation protocol: Yes   Plan:  Dc apixaban Coumadin 5mg  PO x1 Daily INR Lovenox 80mg  BID  Onnie Boer, PharmD, BCIDP, AAHIVP, CPP Infectious Disease Pharmacist 04/28/2022 5:01 PM

## 2022-04-28 NOTE — Progress Notes (Signed)
  Progress Note   Patient: Mitchell Barnes [id number] DOB: July 27, 1946 DOA: 04/27/2022     1 DOS: the patient was seen and examined on 04/28/2022   Brief hospital course: 76 year old man recent hospitalization for stroke, possible mural thrombus, discharged on apixaban; chronic systolic diastolic CHF, presenting with cough and shortness of breath.  Admitted for acute hypoxic respiratory failure secondary to COVID-19.  Incidental note made of rash consistent with STARI.  Assessment and Plan: * COVID-19 virus infection --on 2L, minimal oxygen requirement; asymptomatic --wean oxygen as tolerated --change to solumedrol --complete 3 days remdesivir  Acute respiratory failure with hypoxia (HCC) --secondary to Covid-19 pneumonia. Per EDP 79% to 85% on room air  --wean as tolerated; assess for home oxygen need  [place] tick-associated rash illness --secondary to [employer] Tick per wife; rash c/w STARI --continue doxycycline; appears better today  Chronic combined systolic and diastolic CHF (congestive heart failure) (HCC) --appears stable; not on diuretic  CKD stage IIIa --probable diagnosis; follow-up as an outpatient  Essential hypertension --stable; can resume home meds on discharge  Hyperlipidemia --Stable. Continue crestor.  Seizure ([place]) --Stable. Continue phenobarbital  CVA (cerebral vascular accident) ([place]) --Recent. Discharged on ASA, Eliquis.  Cardiology could not rule out ventricular mural thrombus and wanted to proceed with full anticoagulation. --per pharmacy apixaban levels unreliable with phenobarbital; will need to transition to warfarin  Aortic atherosclerosis (HCC) --Aortic atherosclerosis with aneurysmal dilatation of the aortic root measuring 4.4 cm. Recommend annual imaging followup by CTA or MRA.   --continue Crestor        Subjective:  Feels ok; breathing ok  Physical Exam: Vitals:   04/28/22 0600 04/28/22 0749 04/28/22 1059 04/28/22 1538   BP: (!) 132/51 115/66 122/62 (!) 107/58  Pulse: (!) 54 82 (!) 48 (!) 54  Resp: 19 18 14  (!) 21  Temp: 99 F (37.2 C) 99.2 F (37.3 C) 98.5 F (36.9 C) 98.2 F (36.8 C)  TempSrc: Oral Oral Oral Oral  SpO2: 96% 95% 95% 94%  Weight:      Height:       Physical Exam Vitals reviewed.  Constitutional:      General: He is not in acute distress.    Appearance: He is not ill-appearing or toxic-appearing.  Cardiovascular:     Rate and Rhythm: Normal rate and regular rhythm.     Heart sounds: No murmur heard. Pulmonary:     Effort: Pulmonary effort is normal. No respiratory distress.     Breath sounds: Rales (bilateral posterior bases) present. No wheezing or rhonchi.  Neurological:     Mental Status: He is alert.  Psychiatric:        Mood and Affect: Mood normal.        Behavior: Behavior normal.    Data Reviewed:  Creatine stable 1.29 AST stable 53 LDH decreased CRP stable 8.5  Family Communication: none  Disposition: Status is: Inpatient Remains inpatient appropriate because: hypoxic  Planned Discharge Destination: Home    Time spent: 35 minutes  Author: Murray Hodgkins, MD 04/28/2022 4:18 PM  For on call review [email or web address].

## 2022-04-28 NOTE — Assessment & Plan Note (Addendum)
--  probable diagnosis; follow-up as an outpatient

## 2022-04-28 NOTE — Assessment & Plan Note (Addendum)
--  Aortic atherosclerosis with aneurysmal dilatation of the aortic root measuring 4.4 cm. Recommend annual imaging followup by CTA or MRA.   --continue Crestor

## 2022-04-29 LAB — URINE CULTURE: Culture: <10000 — AB

## 2022-04-29 LAB — PROTIME-INR
INR: 1.2 (ref 0.8–1.2)
Prothrombin Time: 14.9 seconds (ref 11.4–15.2)

## 2022-04-29 MED ORDER — WARFARIN SODIUM 5 MG PO TABS: 5.0000 mg | ORAL_TABLET | Freq: Every day | ORAL | 0 refills | Status: DC

## 2022-04-29 MED ORDER — WARFARIN SODIUM 5 MG PO TABS: 5.0000 mg | ORAL_TABLET | Freq: Once | ORAL | Status: DC

## 2022-04-29 MED ORDER — PREDNISONE 10 MG PO TABS: 40.0000 mg | ORAL_TABLET | Freq: Every day | ORAL | 0 refills | Status: AC

## 2022-04-29 MED ORDER — ENOXAPARIN SODIUM 80 MG/0.8ML IJ SOSY: 80.0000 mg | PREFILLED_SYRINGE | Freq: Two times a day (BID) | INTRAMUSCULAR | 1 refills | Status: DC

## 2022-04-29 NOTE — TOC Transition Note (Signed)
Transition of Care [facility]) - CM/SW Discharge Note  Patient Details  Name: Mitchell Barnes MRN: [id number] Date of Birth: [birth year]  Transition of Care [facility]) CM/SW Contact:  Danie Chandler., RN Phone Number: 04/29/2022, 4:14 PM  Clinical Narrative:    RNCM received additional order for Rolling Walker and [facility] and PT.  RNCM spoke to patient and no preference for services, so Jasmine at [employer] contacted with DME order.  Rolling walker to be delivered to patient's room prior to discharge home.  Cindie at [facility] contacted with [facility] and PTorder and confirmation of services received.  Final next level of care: Home/Self Care Barriers to Discharge: No Barriers Identified  Patient Goals and CMS Choice Patient states their goals for this hospitalization and ongoing recovery are:: to be able to return home CMS Medicare.gov Compare Post Acute Care list provided to:: Patient Choice offered to / list presented to : Patient  Discharge Placement                      Discharge Plan and Services   Discharge Planning Services: CM Consult Post Acute Care Choice: Durable Medical Equipment, Home Health          DME Arranged: Walker rolling DME Agency: [employer] Date DME Agency Contacted: 04/29/22 Time DME Agency Contacted: [phone] Representative spoke with at DME Agency: Leavy Cella HH Arranged: RN, PT HH Agency: [facility] Date [facility] Agency Contacted: 04/29/22 Time HH Agency Contacted: [phone] Representative spoke with at [facility] Agency: Cindie  Social Determinants of Health (SDOH) Interventions   Readmission Risk Interventions     View : No data to display.

## 2022-04-29 NOTE — Plan of Care (Signed)
Discussed with patient earlier in the shift plan of care for the evening, pain management and clustering care with some teach back displayed.  Problem: Education: Goal: Knowledge of General Education information will improve Description: Including pain rating scale, medication(s)/side effects and non-pharmacologic comfort measures Outcome: Progressing   Problem: Health Behavior/Discharge Planning: Goal: Ability to manage health-related needs will improve Outcome: Progressing

## 2022-04-29 NOTE — Discharge Summary (Signed)
Physician Discharge Summary   Patient: Mitchell Barnes MRN: [id number] DOB: 31-Aug-1946  Admit date:     04/27/2022  Discharge date: 04/29/22  Discharge Physician: Murray Hodgkins   PCP: Leeroy Cha, MD   Recommendations at discharge:   * COVID-19 virus infection --on 2L, minimal oxygen requirement; asymptomatic -- Home on prednisone to complete course  Acute respiratory failure with hypoxia ([place]) --secondary to Covid-19 pneumonia. Per EDP 79% to 85% on room air  -- Unable to wean, discharged home on 2 L  Southern tick-associated rash illness --secondary to [employer] Tick per wife; rash c/w STARI --continue doxycycline  CVA (cerebral vascular accident) ([place]) --Recent. Discharged on ASA, Eliquis.  Cardiology could not rule out ventricular mural thrombus and wanted to proceed with full anticoagulation. --per pharmacy apixaban levels unreliable with phenobarbital; changed to warfarin, will bridge with Lovenox, he will need close outpatient follow-up next week for INR check and further adjustments.  Given 1 refill on Lovenox.  Discharge Diagnoses: Principal Problem:   COVID-19 virus infection Active Problems:   Acute respiratory failure with hypoxia (HCC)   Southern tick-associated rash illness   Chronic combined systolic and diastolic CHF (congestive heart failure) (HCC)   Seizure (HCC)   Hyperlipidemia   Essential hypertension   CKD stage IIIa   CVA (cerebral vascular accident) ([place])   Aortic atherosclerosis ([place])   DNR (do not resuscitate)/DNI(Do Not Intubate)  Resolved Problems:   * No resolved hospital problems. *  Hospital Course: 76 year old man recent hospitalization for stroke, possible mural thrombus, discharged on apixaban; chronic systolic diastolic CHF, presenting with cough and shortness of breath.  Admitted for acute hypoxic respiratory failure secondary to COVID-19.  Incidental note made of rash consistent with STARI. Treated with remdesivir  and steroids with rapid clinical improvement.  Remains hypoxic will discharge home on oxygen.  Home health physical therapy and walker.  Changed to Coumadin, see discussion below.  Discussed with son.  * COVID-19 virus infection --on 2L, minimal oxygen requirement; asymptomatic -- Home on prednisone to complete course  Acute respiratory failure with hypoxia (HCC) --secondary to Covid-19 pneumonia. Per EDP 79% to 85% on room air  -- Unable to wean, discharged home on 2 L  [place] tick-associated rash illness --secondary to [employer] T[employer] per wife; rash c/w STARI --continue doxycycline  Chronic combined systolic and diastolic CHF (congestive heart failure) (HCC) --appears stable; not on diuretic  CKD stage IIIa --probable diagnosis; follow-up as an outpatient  Essential hypertension --stable; can resume home meds on discharge  Hyperlipidemia --Stable. Continue crestor.  Seizure ([place]) --Stable. Continue phenobarbital  CVA (cerebral vascular accident) ([place]) --Recent. Discharged on ASA, Eliquis.  Cardiology could not rule out ventricular mural thrombus and wanted to proceed with full anticoagulation. --per pharmacy apixaban levels unreliable with phenobarbital; changed to warfarin, will bridge with Lovenox, he will need close outpatient follow-up next week for INR check and further adjustments.  Given 1 refill on Lovenox.  Aortic atherosclerosis (HCC) --Aortic atherosclerosis with aneurysmal dilatation of the aortic root measuring 4.4 cm. Recommend annual imaging followup by CTA or MRA.   --continue Crestor        Consultants: None Procedures performed: None  Disposition: Home health Diet recommendation:  Discharge Diet Orders (From admission, onward)     Start     Ordered   04/29/22 0000  Diet general        04/29/22 1515           Regular diet DISCHARGE MEDICATION: Allergies as  of 04/29/2022       Reactions   Atorvastatin Other (See Comments)   Muscle  cramps   Dilantin [phenytoin] Itching   Body turns red   Fluconazole Rash        Medication List     STOP taking these medications    apixaban 5 MG Tabs tablet Commonly known as: ELIQUIS       TAKE these medications    acetaminophen 650 MG CR tablet Commonly known as: TYLENOL Take 650 mg by mouth every 8 (eight) hours as needed for pain or fever.   diphenhydrAMINE 25 MG tablet Commonly known as: BENADRYL Take 25 mg by mouth every 6 (six) hours as needed for itching.   doxycycline 100 MG capsule Commonly known as: VIBRAMYCIN Take 1 capsule (100 mg total) by mouth 2 (two) times daily. What changed:  when to take this additional instructions   enoxaparin 80 MG/0.8ML injection Commonly known as: LOVENOX Inject 0.8 mLs (80 mg total) into the skin 2 (two) times daily for 14 days.   ferrous sulfate 325 (65 FE) MG tablet Take 325 mg by mouth every Monday, Wednesday, and Friday. Monday, Thursday   PHENobarbital 32.4 MG tablet Commonly known as: LUMINAL Take 64.8 mg by mouth 2 (two) times daily.   predniSONE 10 MG tablet Commonly known as: DELTASONE Take 4 tablets (40 mg total) by mouth daily for 8 days.   rosuvastatin 20 MG tablet Commonly known as: Crestor Take 1 tablet (20 mg total) by mouth daily.   vitamin B-12 500 MCG tablet Commonly known as: CYANOCOBALAMIN Take 500 mcg by mouth daily.   Vitamin D (Ergocalciferol) 1.25 MG (50000 UNIT) Caps capsule Commonly known as: DRISDOL Take 50,000 Units by mouth every Tuesday.   warfarin 5 MG tablet Commonly known as: COUMADIN Take 1 tablet (5 mg total) by mouth daily at 4 PM.               Durable Medical Equipment  (From admission, onward)           Start     Ordered   04/29/22 0912  For home use only DME oxygen  Once       Question Answer Comment  Length of Need 6 Months   Mode or (Route) Nasal cannula   Liters per Minute 2   Frequency Continuous (stationary and portable oxygen unit needed)    Oxygen delivery system Gas      04/29/22 0911            Follow-up Information     Leeroy Cha, MD. Schedule an appointment as soon as possible for a visit in 2 week(s).   Specialty: Internal Medicine Contact information: [address] [place]  [address] [phone]         Minna Merritts, MD .   Specialty: Cardiology Contact information: [place] [address] [phone]                Feels better Breathing ok  Discharge Exam: Filed Weights   04/27/22 1653 04/28/22 0001  Weight: 88.5 kg 81.8 kg   Physical Exam Vitals reviewed.  Constitutional:      General: He is not in acute distress.    Appearance: He is not ill-appearing or toxic-appearing.  Cardiovascular:     Rate and Rhythm: Normal rate and regular rhythm.     Heart sounds: No murmur heard. Pulmonary:     Effort: Pulmonary effort is  normal. No respiratory distress.     Breath sounds: No wheezing, rhonchi or rales.  Neurological:     Mental Status: He is alert.  Psychiatric:        Mood and Affect: Mood normal.        Behavior: Behavior normal.     Condition at discharge: good  The results of significant diagnostics from this hospitalization (including imaging, microbiology, ancillary and laboratory) are listed below for reference.   Imaging Studies: DG Chest 1 View  Result Date: 04/27/2022 CLINICAL DATA:  Questionable sepsis EXAM: CHEST  1 VIEW COMPARISON:  04/15/2020 FINDINGS: Cardiomegaly. Mild, diffuse bilateral interstitial opacity, similar to prior examination. No new or focal airspace opacity. The visualized skeletal structures are unremarkable. IMPRESSION: Cardiomegaly with mild, diffuse bilateral interstitial opacity, similar to prior examination and likely edema. No new or focal airspace opacity. Electronically Signed   By: Delanna Ahmadi M.D.   On: 04/27/2022 17:53   CT ANGIO NECK W OR WO CONTRAST  Result Date:  04/19/2022 CLINICAL DATA:  Stroke follow-up EXAM: CT ANGIOGRAPHY NECK TECHNIQUE: Multidetector CT imaging of the neck was performed using the standard protocol during bolus administration of intravenous contrast. Multiplanar CT image reconstructions and MIPs were obtained to evaluate the vascular anatomy. Carotid stenosis measurements (when applicable) are obtained utilizing NASCET criteria, using the distal internal carotid diameter as the denominator. RADIATION DOSE REDUCTION: This exam was performed according to the departmental dose-optimization program which includes automated exposure control, adjustment of the mA and/or kV according to patient size and/or use of iterative reconstruction technique. CONTRAST:  16mL OMNIPAQUE IOHEXOL 350 MG/ML SOLN COMPARISON:  MRA neck dated 1 day prior FINDINGS: Aortic arch: Imaged aortic arch is normal. The origins of the major branch vessels are patent. The subclavian arteries are patent to the level imaged with mild plaque at the origin on the right. Right carotid system: The right common, internal, and external carotid arteries are patent, without hemodynamically significant stenosis or occlusion. There is no dissection or aneurysm. Left carotid system: The left common, internal, and external carotid arteries are patent. There is mild narrowing of the internal carotid artery as it passes between the C1 lateral mass and the styloid process (4-91, 12-128). Otherwise, the internal carotid artery is widely patent. There is no dissection or aneurysm. Vertebral arteries: There is moderate to severe stenosis at the origin of the right vertebral artery. The remainder of the right vertebral artery is widely patent. The left vertebral artery is patent. There is no evidence of dissection or aneurysm. Skeleton: There is no acute osseous abnormality or suspicious osseous lesion. There is no visible canal hematoma. Other neck: The soft tissues are unremarkable. Upper chest: There are  subpleural reticular opacities in both lung apices. There is ground-glass opacity in the left apex measuring up to 1.4 cm (7-211). IMPRESSION: 1. Moderate to severe stenosis at the origin of the right vertebral artery. 2. Mild narrowing of the left internal carotid artery is a passes between the styloid process and left C1 lateral mass. 3. Otherwise, patent vasculature of the neck with no hemodynamically significant stenosis or occlusion. No significant atherosclerotic disease at the carotid bifurcations. 4. Ground-glass opacity in the left lung apex measuring up to 1.4 cm, and subpleural reticular opacities in both lung apices. Recommend nonemergent dedicated CT of the chest for further evaluation. Electronically Signed   By: Valetta Mole M.D.   On: 04/19/2022 10:31   CT Angio Chest PE W and/or Wo Contrast  Result Date:  04/27/2022 CLINICAL DATA:  Pulmonary embolism suspected, high probability. COVID positive. EXAM: CT ANGIOGRAPHY CHEST WITH CONTRAST TECHNIQUE: Multidetector CT imaging of the chest was performed using the standard protocol during bolus administration of intravenous contrast. Multiplanar CT image reconstructions and MIPs were obtained to evaluate the vascular anatomy. RADIATION DOSE REDUCTION: This exam was performed according to the departmental dose-optimization program which includes automated exposure control, adjustment of the mA and/or kV according to patient size and/or use of iterative reconstruction technique. CONTRAST:  6mL OMNIPAQUE IOHEXOL 350 MG/ML SOLN COMPARISON:  04/15/2020. FINDINGS: Cardiovascular: The heart is enlarged and scattered coronary artery calcifications are noted. There is a trace pericardial effusion. There is atherosclerotic calcification of the aorta with aneurysmal dilatation of the aortic root measuring 4.4 cm. The pulmonary trunk is normal in caliber. No definite pulmonary artery filling defect. Mediastinum/Nodes: A nonspecific prominent lymph node is present  in the right paratracheal space measuring 1.1 cm in short axis diameter. A prominent lymph node is present at the left hilum measuring 1.4 cm. A prominent lymph node is present at the right hilum measuring 1 cm. No axillary lymphadenopathy. The thyroid gland, trachea, and esophagus are within normal limits. Lungs/Pleura: Patchy ground-glass opacities are noted in the lungs bilaterally with a peripheral predominance, compatible with history of atypical pneumonia. No consolidation, effusion, or pneumothorax. Upper Abdomen: Cyst is present in the right kidney. No acute abnormality. Musculoskeletal: Degenerative changes are present in the thoracic spine. No acute or suspicious osseous abnormality. Review of the MIP images confirms the above findings. IMPRESSION: 1. No evidence of pulmonary embolism. 2. Patchy airspace disease in the lungs bilaterally with a peripheral predominance, compatible with history of atypical pneumonia. 3. Cardiomegaly with coronary artery calcifications. 4. Aortic atherosclerosis with aneurysmal dilatation of the aortic root measuring 4.4 cm. Recommend annual imaging followup by CTA or MRA. This recommendation follows 2010 ACCF/AHA/AATS/ACR/ASA/SCA/SCAI/SIR/STS/SVM Guidelines for the Diagnosis and Management of Patients with Thoracic Aortic Disease. Circulation. 2010; [id number]. Aortic aneurysm NOS (ICD10-I71.9) Electronically Signed   By: Brett Fairy M.D.   On: 04/27/2022 22:06   MR ANGIO HEAD WO CONTRAST  Result Date: 04/18/2022 CLINICAL DATA:  right sided hemianopia, eval cva; Stroke, follow up; Neuro deficit, acute, stroke suspected EXAM: MRI HEAD WITHOUT CONTRAST MRA HEAD WITHOUT CONTRAST MRA NECK WITHOUT CONTRAST TECHNIQUE: Multiplanar, multiecho pulse sequences of the brain and surrounding structures were obtained without intravenous contrast. Angiographic images of the Circle of Willis were obtained using MRA technique without intravenous contrast. Angiographic images of the  neck were obtained using MRA technique without intravenous contrast. Carotid stenosis measurements (when applicable) are obtained utilizing NASCET criteria, using the distal internal carotid diameter as the denominator. COMPARISON:  CT head from the same day. FINDINGS: MRI HEAD FINDINGS Brain: Small areas of restricted diffusion in the high left frontal and left parieto-occipital cortex, suggestive of small acute infarcts. Mild edema without mass effect. Mild to moderate patchy T2/FLAIR hyperintensities in the white matter, nonspecific but compatible with chronic microvascular ischemic disease. No evidence of acute hemorrhage, hydrocephalus, mass lesion, midline shift, or extra-axial fluid collection. Cerebral atrophy. Small remote right cerebellar lacunar infarct. Vascular: Detailed below. Skull and upper cervical spine: Normal marrow signal. Sinuses/Orbits: Clear sinuses.  No acute orbital findings. Other: No mastoid effusions. MRA HEAD FINDINGS Anterior circulation: Bilateral intracranial ICAs, MCAs, and ACAs are patent without proximal hemodynamically significant stenosis. Partially Azygos ACA, anatomic variant. Posterior circulation: Bilateral intradural vertebral arteries, basilar artery and posterior cerebral arteries are patent without proximal significant stenosis.  MRA NECK FINDINGS Motion limited. No evidence of significant (greater than 50%) stenosis in the visualized carotid systems and vertebral arteries. The proximal arteries (including the origins) are not imaged. IMPRESSION: 1. Small areas of restricted diffusion in the high left frontal and left parieto-occipital cortex, suggestive of small acute infarcts. Mild edema without mass effect. 2. No evidence of emergent large vessel occlusion or proximal hemodynamically significant stenosis. Electronically Signed   By: Margaretha Sheffield M.D.   On: 04/18/2022 17:18   MR ANGIO NECK WO CONTRAST  Result Date: 04/18/2022 CLINICAL DATA:  right sided  hemianopia, eval cva; Stroke, follow up; Neuro deficit, acute, stroke suspected EXAM: MRI HEAD WITHOUT CONTRAST MRA HEAD WITHOUT CONTRAST MRA NECK WITHOUT CONTRAST TECHNIQUE: Multiplanar, multiecho pulse sequences of the brain and surrounding structures were obtained without intravenous contrast. Angiographic images of the Circle of Willis were obtained using MRA technique without intravenous contrast. Angiographic images of the neck were obtained using MRA technique without intravenous contrast. Carotid stenosis measurements (when applicable) are obtained utilizing NASCET criteria, using the distal internal carotid diameter as the denominator. COMPARISON:  CT head from the same day. FINDINGS: MRI HEAD FINDINGS Brain: Small areas of restricted diffusion in the high left frontal and left parieto-occipital cortex, suggestive of small acute infarcts. Mild edema without mass effect. Mild to moderate patchy T2/FLAIR hyperintensities in the white matter, nonspecific but compatible with chronic microvascular ischemic disease. No evidence of acute hemorrhage, hydrocephalus, mass lesion, midline shift, or extra-axial fluid collection. Cerebral atrophy. Small remote right cerebellar lacunar infarct. Vascular: Detailed below. Skull and upper cervical spine: Normal marrow signal. Sinuses/Orbits: Clear sinuses.  No acute orbital findings. Other: No mastoid effusions. MRA HEAD FINDINGS Anterior circulation: Bilateral intracranial ICAs, MCAs, and ACAs are patent without proximal hemodynamically significant stenosis. Partially Azygos ACA, anatomic variant. Posterior circulation: Bilateral intradural vertebral arteries, basilar artery and posterior cerebral arteries are patent without proximal significant stenosis. MRA NECK FINDINGS Motion limited. No evidence of significant (greater than 50%) stenosis in the visualized carotid systems and vertebral arteries. The proximal arteries (including the origins) are not imaged. IMPRESSION:  1. Small areas of restricted diffusion in the high left frontal and left parieto-occipital cortex, suggestive of small acute infarcts. Mild edema without mass effect. 2. No evidence of emergent large vessel occlusion or proximal hemodynamically significant stenosis. Electronically Signed   By: Margaretha Sheffield M.D.   On: 04/18/2022 17:18   MR BRAIN WO CONTRAST  Result Date: 04/18/2022 CLINICAL DATA:  right sided hemianopia, eval cva; Stroke, follow up; Neuro deficit, acute, stroke suspected EXAM: MRI HEAD WITHOUT CONTRAST MRA HEAD WITHOUT CONTRAST MRA NECK WITHOUT CONTRAST TECHNIQUE: Multiplanar, multiecho pulse sequences of the brain and surrounding structures were obtained without intravenous contrast. Angiographic images of the Circle of Willis were obtained using MRA technique without intravenous contrast. Angiographic images of the neck were obtained using MRA technique without intravenous contrast. Carotid stenosis measurements (when applicable) are obtained utilizing NASCET criteria, using the distal internal carotid diameter as the denominator. COMPARISON:  CT head from the same day. FINDINGS: MRI HEAD FINDINGS Brain: Small areas of restricted diffusion in the high left frontal and left parieto-occipital cortex, suggestive of small acute infarcts. Mild edema without mass effect. Mild to moderate patchy T2/FLAIR hyperintensities in the white matter, nonspecific but compatible with chronic microvascular ischemic disease. No evidence of acute hemorrhage, hydrocephalus, mass lesion, midline shift, or extra-axial fluid collection. Cerebral atrophy. Small remote right cerebellar lacunar infarct. Vascular: Detailed below. Skull and upper cervical spine: Normal  marrow signal. Sinuses/Orbits: Clear sinuses.  No acute orbital findings. Other: No mastoid effusions. MRA HEAD FINDINGS Anterior circulation: Bilateral intracranial ICAs, MCAs, and ACAs are patent without proximal hemodynamically significant stenosis.  Partially Azygos ACA, anatomic variant. Posterior circulation: Bilateral intradural vertebral arteries, basilar artery and posterior cerebral arteries are patent without proximal significant stenosis. MRA NECK FINDINGS Motion limited. No evidence of significant (greater than 50%) stenosis in the visualized carotid systems and vertebral arteries. The proximal arteries (including the origins) are not imaged. IMPRESSION: 1. Small areas of restricted diffusion in the high left frontal and left parieto-occipital cortex, suggestive of small acute infarcts. Mild edema without mass effect. 2. No evidence of emergent large vessel occlusion or proximal hemodynamically significant stenosis. Electronically Signed   By: Margaretha Sheffield M.D.   On: 04/18/2022 17:18   ECHOCARDIOGRAM COMPLETE  Result Date: 04/19/2022    ECHOCARDIOGRAM REPORT   Patient Name:   Mitchell Barnes Date of Exam: 04/19/2022 Medical Rec #:  [id number]             Height:       68.0 in Accession #:    [id number]            Weight:       195.0 lb Date of Birth:  02/02/1946             BSA:          2.022 m Patient Age:    8 years              BP:           119/70 mmHg Patient Gender: M                     HR:           75 bpm. Exam Location:  [facility] Procedure: 2D Echo, Color Doppler, Cardiac Doppler and Intracardiac            Opacification Agent Indications:     I63.9 Stroke  History:         Patient has prior history of Echocardiogram examinations. Risk                  Factors:Dyslipidemia.  Sonographer:     Charmayne Sheer Referring Phys:  [id number] AGBATA Diagnosing Phys: Ida Rogue MD  Sonographer Comments: Suboptimal apical window and no subcostal window. IMPRESSIONS  1. Left ventricular ejection fraction, by estimation, is 35 to 40%. The left ventricle has moderately decreased function. The left ventricle demonstrates severe global hypokinesis, basal regions best preserved (possibly consistent with stress cardiomyopathy). The left  ventricular internal cavity size was mildly dilated. Left ventricular diastolic parameters are consistent with Grade I diastolic dysfunction (impaired relaxation).  2. Unable to exclude mural thrombus  3. Right ventricular systolic function is normal. The right ventricular size is normal.  4. The mitral valve is normal in structure. No evidence of mitral valve regurgitation. No evidence of mitral stenosis.  5. The aortic valve is normal in structure. Aortic valve regurgitation is not visualized. Aortic valve sclerosis is present, with no evidence of aortic valve stenosis.  6. There is mild dilatation of the aortic root, measuring 40 mm.  7. The inferior vena cava is normal in size with greater than 50% respiratory variability, suggesting right atrial pressure of 3 mmHg. FINDINGS  Left Ventricle: Left ventricular ejection fraction, by estimation, is 35 to 40%. The left ventricle has moderately decreased function. The left ventricle  demonstrates global hypokinesis. Definity contrast agent was given IV to delineate the left ventricular endocardial borders. The left ventricular internal cavity size was mildly dilated. There is no left ventricular hypertrophy. Left ventricular diastolic parameters are consistent with Grade I diastolic dysfunction (impaired relaxation). Right Ventricle: The right ventricular size is normal. No increase in right ventricular wall thickness. Right ventricular systolic function is normal. Left Atrium: Left atrial size was normal in size. Right Atrium: Right atrial size was normal in size. Pericardium: There is no evidence of pericardial effusion. Mitral Valve: The mitral valve is normal in structure. No evidence of mitral valve regurgitation. No evidence of mitral valve stenosis. MV peak gradient, 3.4 mmHg. The mean mitral valve gradient is 1.0 mmHg. Tricuspid Valve: The tricuspid valve is normal in structure. Tricuspid valve regurgitation is not demonstrated. No evidence of tricuspid  stenosis. Aortic Valve: The aortic valve is normal in structure. Aortic valve regurgitation is not visualized. Aortic valve sclerosis is present, with no evidence of aortic valve stenosis. Aortic valve mean gradient measures 3.0 mmHg. Aortic valve peak gradient measures 5.5 mmHg. Aortic valve area, by VTI measures 3.65 cm. Pulmonic Valve: The pulmonic valve was normal in structure. Pulmonic valve regurgitation is not visualized. No evidence of pulmonic stenosis. Aorta: The aortic root is normal in size and structure. There is mild dilatation of the aortic root, measuring 40 mm. Venous: The inferior vena cava is normal in size with greater than 50% respiratory variability, suggesting right atrial pressure of 3 mmHg. IAS/Shunts: No atrial level shunt detected by color flow Doppler.  LEFT VENTRICLE PLAX 2D LVIDd:         4.22 cm   Diastology LVIDs:         2.85 cm   LV e' medial:    4.90 cm/s LV PW:         1.37 cm   LV E/e' medial:  13.7 LV IVS:        0.94 cm   LV e' lateral:   6.53 cm/s LVOT diam:     2.30 cm   LV E/e' lateral: 10.3 LV SV:         71 LV SV Index:   35 LVOT Area:     4.15 cm  RIGHT VENTRICLE RV Basal diam:  2.53 cm LEFT ATRIUM             Index        RIGHT ATRIUM          Index LA diam:        3.70 cm 1.83 cm/m   RA Area:     9.17 cm LA Vol (A2C):   24.5 ml 12.12 ml/m  RA Volume:   16.30 ml 8.06 ml/m LA Vol (A4C):   31.1 ml 15.38 ml/m LA Biplane Vol: 28.5 ml 14.10 ml/m  AORTIC VALVE                    PULMONIC VALVE AV Area (Vmax):    3.36 cm     PV Vmax:       0.84 m/s AV Area (Vmean):   2.79 cm     PV Vmean:      54.400 cm/s AV Area (VTI):     3.65 cm     PV VTI:        0.154 m AV Vmax:           117.00 cm/s  PV Peak grad:  2.8 mmHg AV Vmean:  87.500 cm/s  PV Mean grad:  1.0 mmHg AV VTI:            0.196 m AV Peak Grad:      5.5 mmHg AV Mean Grad:      3.0 mmHg LVOT Vmax:         94.60 cm/s LVOT Vmean:        58.700 cm/s LVOT VTI:          0.172 m LVOT/AV VTI ratio: 0.88  AORTA  Ao Root diam: 4.00 cm MITRAL VALVE MV Area (PHT): 3.03 cm    SHUNTS MV Area VTI:   2.71 cm    Systemic VTI:  0.17 m MV Peak grad:  3.4 mmHg    Systemic Diam: 2.30 cm MV Mean grad:  1.0 mmHg MV Vmax:       0.92 m/s MV Vmean:      52.3 cm/s MV Decel Time: 250 msec MV E velocity: 67.30 cm/s MV A velocity: 72.80 cm/s MV E/A ratio:  0.92 Julien Nordmann MD Electronically signed by Julien Nordmann MD Signature Date/Time: 04/19/2022/12:03:01 PM    Final    CT HEAD CODE STROKE WO CONTRAST  Result Date: 04/18/2022 CLINICAL DATA:  Code stroke. NEURO DEFICIT, ACUTE, STROKE SUSPECTED. RIGHT-SIDED PERIPHERAL VISION CHANGE. EXAM: CT HEAD WITHOUT CONTRAST TECHNIQUE: Contiguous axial images were obtained from the base of the skull through the vertex without intravenous contrast. RADIATION DOSE REDUCTION: This exam was performed according to the departmental dose-optimization program which includes automated exposure control, adjustment of the mA and/or kV according to patient size and/or use of iterative reconstruction technique. COMPARISON:  None Available. FINDINGS: Brain: Moderate atrophy and white matter changes are present bilaterally. Focal hypoattenuation in the anterior limb of the right internal capsule is likely remote. A remote left parietal lobe infarct is noted. No other acute or focal cortical abnormality is present. Basal ganglia are intact. Insular ribbon is normal. Brainstem and cerebellum are unremarkable. The ventricles are of normal size. No significant extraaxial fluid collection is present. Vascular: Atherosclerotic calcifications are present within the cavernous internal carotid arteries bilaterally. Skull: Calvarium is intact. No focal lytic or blastic lesions are present. No significant extracranial soft tissue lesion is present. Sinuses/Orbits: The paranasal sinuses and mastoid air cells are clear. Bilateral lens replacements are noted. Globes and orbits are otherwise unremarkable. ASPECTS [facility]  Stroke Program Early CT Score) - Ganglionic level infarction (caudate, lentiform nuclei, internal capsule, insula, M1-M3 cortex): 7/7 - Supraganglionic infarction (M4-M6 cortex): 3/3 Total score (0-10 with 10 being normal): 10/10 IMPRESSION: 1. Moderate atrophy and white matter disease likely reflects the sequela of chronic microvascular ischemia. 2. Remote left parietal lobe infarct. 3. No acute intracranial abnormality. 4. Aspects 10/10 Electronically Signed   By: Marin Roberts M.D.   On: 04/18/2022 15:00    Microbiology: Results for orders placed or performed during the hospital encounter of 04/27/22  Blood Culture (routine x 2)     Status: None (Preliminary result)   Collection Time: 04/27/22  5:26 PM   Specimen: BLOOD  Result Value Ref Range Status   Specimen Description BLOOD LEFT ANTECUBITAL  Final   Special Requests   Final    BOTTLES DRAWN AEROBIC AND ANAEROBIC Blood Culture results may not be optimal due to an inadequate volume of blood received in culture bottles   Culture   Final    NO GROWTH < 24 HOURS Performed at [facility], [address] [place], [place] [address]  Report Status PENDING  Incomplete  Resp Panel by RT-PCR (Flu A&B, Covid) Anterior Nasal Swab     Status: Abnormal   Collection Time: 04/27/22  7:17 PM   Specimen: Anterior Nasal Swab  Result Value Ref Range Status   SARS Coronavirus 2 by RT PCR POSITIVE (A) NEGATIVE Final    Comment: (NOTE) SARS-CoV-2 target nucleic acids are DETECTED.  The SARS-CoV-2 RNA is generally detectable in upper respiratory specimens during the acute phase of infection. Positive results are indicative of the presence of the identified virus, but do not rule out bacterial infection or co-infection with other pathogens not detected by the test. Clinical correlation with patient history and other diagnostic information is necessary to determine patient infection status. The expected result is Negative.  Fact Sheet  for Patients: BloggerCourse.com  Fact Sheet for Healthcare Providers: SeriousBroker.it  This test is not yet approved or cleared by the [place] FDA and  has been authorized for detection and/or diagnosis of SARS-CoV-2 by FDA under an Emergency Use Authorization (EUA).  This EUA will remain in effect (meaning this test can be used) for the duration of  the COVID-19 declaration under Section 564(b)(1) of the A ct, 21 U.S.C. section 360bbb-3(b)(1), unless the authorization is terminated or revoked sooner.     Influenza A by PCR NEGATIVE NEGATIVE Final   Influenza B by PCR NEGATIVE NEGATIVE Final    Comment: (NOTE) The Xpert Xpress SARS-CoV-2/FLU/RSV plus assay is intended as an aid in the diagnosis of influenza from Nasopharyngeal swab specimens and should not be used as a sole basis for treatment. Nasal washings and aspirates are unacceptable for Xpert Xpress SARS-CoV-2/FLU/RSV testing.  Fact Sheet for Patients: BloggerCourse.com  Fact Sheet for Healthcare Providers: SeriousBroker.it  This test is not yet approved or cleared by the [place] FDA and has been authorized for detection and/or diagnosis of SARS-CoV-2 by FDA under an Emergency Use Authorization (EUA). This EUA will remain in effect (meaning this test can be used) for the duration of the COVID-19 declaration under Section 564(b)(1) of the Act, 21 U.S.C. section 360bbb-3(b)(1), unless the authorization is terminated or revoked.  Performed at [facility], [address] [place], [place] [address]   MRSA Next Gen by PCR, Nasal     Status: Abnormal   Collection Time: 04/28/22 12:13 AM   Specimen: Nasal Mucosa; Nasal Swab  Result Value Ref Range Status   MRSA by PCR Next Gen DETECTED (A) NOT DETECTED Final    Comment: RESULTS CALLED TO READ BACK BY AND VERIFIED WITH RN R.ZORSONA  ON 04/28/2022 BY  NM (NOTE) The GeneXpert MRSA Assay (FDA approved for NASAL specimens only), is one component of a comprehensive MRSA colonization surveillance program. It is not intended to diagnose MRSA infection nor to guide or monitor treatment for MRSA infections. Test performance is not FDA approved in patients less than 29 years old. Performed at [facility], [address], [place], [place] [address]   Urine Culture     Status: Abnormal   Collection Time: 04/28/22 12:14 AM   Specimen: In/Out Cath Urine  Result Value Ref Range Status   Specimen Description IN/OUT CATH URINE  Final   Special Requests NONE  Final   Culture (A)  Final    <10,000 COLONIES/mL INSIGNIFICANT GROWTH Performed at [facility], [address], [place], [place] [address]    Report Status 04/29/2022 FINAL  Final  Blood Culture (routine x 2)  Status: None (Preliminary result)   Collection Time: 04/28/22 12:33 AM   Specimen: BLOOD LEFT HAND  Result Value Ref Range Status   Specimen Description BLOOD LEFT HAND  Final   Special Requests   Final    BOTTLES DRAWN AEROBIC ONLY Blood Culture results may not be optimal due to an inadequate volume of blood received in culture bottles   Culture   Final    NO GROWTH < 12 HOURS Performed at [facility], [address] [place], [place] [address]    Report Status PENDING  Incomplete    Labs: CBC: Recent Labs  Lab 04/27/22 1726 04/28/22 0618  WBC 8.1 7.7  NEUTROABS 6.1 4.7  HGB 12.4* 11.6*  HCT 37.3* 34.6*  MCV 94.9 92.8  PLT 215 [id number]   Basic Metabolic Panel: Recent Labs  Lab 04/27/22 1726 04/28/22 0618  NA 135 134*  K 4.2 4.1  CL 105 103  CO2 20* 21*  GLUCOSE 116* 97  BUN 19 17  CREATININE 1.47* 1.29*  CALCIUM 8.5* 8.1*  MG  --  2.2   Liver Function Tests: Recent Labs  Lab 04/27/22 1726 04/28/22 0618  AST 54* 53*  ALT 23 23  ALKPHOS 46 40  BILITOT 0.5 0.5  PROT 7.0 6.2*  ALBUMIN 3.4* 2.9*   CBG: No results for input(s):  GLUCAP in the last 168 hours.  Discharge time spent: greater than 30 minutes.  Signed: Murray Hodgkins, MD [facility] 04/29/2022

## 2022-04-29 NOTE — Progress Notes (Addendum)
ANTICOAGULATION CONSULT NOTE - Initial Consult  Pharmacy Consult for coumadin/Lovenox Indication: stroke/mural thrombus  Allergies  Allergen Reactions   Atorvastatin Other (See Comments)    Muscle cramps   Dilantin [Phenytoin] Itching    Body turns red   Fluconazole Rash    Patient Measurements: Height: 5\' 8"  (172.7 cm) Weight: 81.8 kg (180 lb 5.4 oz) IBW/kg (Calculated) : 68.4 Heparin Dosing Weight: 82kg  Vital Signs: Temp: 98.1 F (36.7 C) (05/28 1049) Temp Source: Oral (05/28 1049) BP: 130/66 (05/28 1049) Pulse Rate: 62 (05/28 1049)  Labs: Recent Labs    04/27/22 1726 04/28/22 0618 04/29/22 0100  HGB 12.4* 11.6*  --   HCT 37.3* 34.6*  --   PLT 215 182  --   APTT 29  --   --   LABPROT 13.6  --  14.9  INR 1.1  --  1.2  CREATININE 1.47* 1.29*  --      Estimated Creatinine Clearance: 47.9 mL/min (A) (by C-G formula based on SCr of 1.29 mg/dL (H)).   Medical History: Past Medical History:  Diagnosis Date   Anemia    Aortic atherosclerosis (HCC) 04/28/2022   HLD (hyperlipidemia)    Seizures (HCC)     Medications:  Medications Prior to Admission  Medication Sig Dispense Refill Last Dose   acetaminophen (TYLENOL) 650 MG CR tablet Take 650 mg by mouth every 8 (eight) hours as needed for pain or fever.   04/26/2022   apixaban (ELIQUIS) 5 MG TABS tablet Take 1 tablet (5 mg total) by mouth 2 (two) times daily. Do not start this medication until 5/25. 60 tablet 2 04/27/2022 at 0900   diphenhydrAMINE (BENADRYL) 25 MG tablet Take 25 mg by mouth every 6 (six) hours as needed for itching.   04/27/2022   doxycycline (VIBRAMYCIN) 100 MG capsule Take 1 capsule (100 mg total) by mouth 2 (two) times daily. (Patient taking differently: Take 100 mg by mouth See admin instructions. Bid x 10 days) 20 capsule 0 04/27/2022   ferrous sulfate 325 (65 FE) MG tablet Take 325 mg by mouth every Monday, Wednesday, and Friday. Monday, Thursday   04/27/2022   PHENobarbital (LUMINAL) 32.4 MG  tablet Take 64.8 mg by mouth 2 (two) times daily.   04/27/2022   rosuvastatin (CRESTOR) 20 MG tablet Take 1 tablet (20 mg total) by mouth daily. 30 tablet 11 04/27/2022   vitamin B-12 (CYANOCOBALAMIN) 500 MCG tablet Take 500 mcg by mouth daily.   04/27/2022   Vitamin D, Ergocalciferol, (DRISDOL) 1.25 MG (50000 UNIT) CAPS capsule Take 50,000 Units by mouth every Tuesday.   04/24/2022   Scheduled:   Chlorhexidine Gluconate Cloth  6 each Topical Q0600   dexamethasone  6 mg Oral Daily   doxycycline  100 mg Oral BID   enoxaparin (LOVENOX) injection  80 mg Subcutaneous BID   mupirocin ointment  1 application. Nasal BID   PHENobarbital  64.8 mg Oral BID   rosuvastatin  20 mg Oral Daily   Warfarin - Pharmacist Dosing Inpatient   Does not apply q1600   Infusions:   remdesivir 100 mg in NS 100 mL Stopped (04/28/22 1133)    Assessment: Pt was recent admitted/discharged for CVA with possibility of mural thrombus. He was placed on apixaban. However, he is also on phenobarbital witch is a major inducer of liver enzymes and p-gp. This would ultimately reduced apixaban level severely. D/w Dr. 04/30/22 and we will transition to coumadin so we can adjust INR.  INR 1.2 today.  If dc home today, continue to bridge with lovenox and give coumadin 5mg  daily and check INR Tues  Goal of Therapy:  INR 2-3 Anti-Xa 0.6-1 Monitor platelets by anticoagulation protocol: Yes   Plan:  Coumadin 5mg  PO x1 Daily INR Lovenox 80mg  BID  , PharmD, BCIDP, AAHIVP, CPP Infectious Disease Pharmacist 04/29/2022 12:48 PM

## 2022-04-29 NOTE — Progress Notes (Signed)
RN educated patient on how to self inject Lovenox shots and provided education for patient to read at home.  RN also went through discharge paperwork including appointments, medications, and CHF education.  RN also provided patient with Living with HF booklet.  Patient sent home with delivered portable oxygen & oxygen tank, front wheel walker, and discharge paperwork and education.

## 2022-04-29 NOTE — Progress Notes (Signed)
SATURATION QUALIFICATIONS: (This note is used to comply with regulatory documentation for home oxygen)  Patient Saturations on Room Air at Rest =87%  Patient Saturations on Room Air while Ambulating =85%  Patient Saturations on 2 Liters of oxygen while Ambulating = 94%

## 2022-04-29 NOTE — Evaluation (Signed)
Physical Therapy Evaluation Patient Details Name: Mitchell Barnes MRN: [id number] DOB: 11/03/1946 Today's Date: 04/29/2022  History of Present Illness  Pt is a 76 y.o. male who presented 04/27/22 with cough and SOB. Pt admitted with COVID-19 and tick bite of L thigh with local reaction. PMH: chronic systolic/diastolic heart failure EF of 35%, recent stroke this month, questionable mural thrombus on Eliquis, hyperlipidemia   Clinical Impression  Pt presents with condition above and deficits mentioned below, see PT Problem List. PTA, he was IND without DME, living with his wife, who uses an AD for mobility. Pt lives in a 1-level house with an elevator entrance option. Currently, pt is demonstrating deficits in strength, aerobic endurance, and balance. He is at risk for falls, needing up to minA to prevent LOB when ambulating without UE support and when using his cane today. He was able to ambulate safely without LOB or need for assistance when using a rollator though. Educated pt on safe management of a rollator. Pt would benefit from follow-up with HHPT and further acute PT services to maximize his return to baseline.       Recommendations for follow up therapy are one component of a multi-disciplinary discharge planning process, led by the attending physician.  Recommendations may be updated based on patient status, additional functional criteria and insurance authorization.  Follow Up Recommendations Home health PT    Assistance Recommended at Discharge PRN  Patient can return home with the following       Equipment Recommendations Rollator (4 wheels)  Recommendations for Other Services       Functional Status Assessment Patient has had a recent decline in their functional status and demonstrates the ability to make significant improvements in function in a reasonable and predictable amount of time.     Precautions / Restrictions Precautions Precautions: Fall;Other  (comment) Precaution Comments: watch SpO2 Restrictions Weight Bearing Restrictions: No      Mobility  Bed Mobility Overal bed mobility: Modified Independent             General bed mobility comments: Pt able to perform all bed mobility aspects without assistance, HOB elevated    Transfers Overall transfer level: Needs assistance Equipment used: None, Rollator (4 wheels) Transfers: Sit to/from Stand Sit to Stand: Min guard           General transfer comment: Min guard for safety coming to stand from EOB 1x without UE support and from rollator 1x. Cues for rollator brakes application for safety    Ambulation/Gait Ambulation/Gait assistance: Min guard, Min assist Gait Distance (Feet): 600 Feet (x2 bouts of ~600 ft > ~15 ft) Assistive device: Rollator (4 wheels), None (3-point cane) Gait Pattern/deviations: Step-through pattern, Decreased stride length, Narrow base of support Gait velocity: reduced Gait velocity interpretation: >2.62 ft/sec, indicative of community ambulatory   General Gait Details: Initially ambulating without AD with narrow BOS and intermittent lateral LOB/staggering, needing up to minA to recover. Attempted using his 3-prong cane, but he ambulates fairly quickly and would not keep up with his cane, thus continued to stumble. Transitioned to rollator with noted improved stability at his preferred fairly quick gait speed, no LOB, min guard for safety. Educated pt on safe management of rollator and use of brakes with transfers.  Stairs            Wheelchair Mobility    Modified Rankin (Stroke Patients Only)       Balance Overall balance assessment: Needs assistance Sitting-balance support: No upper extremity  supported, Feet supported Sitting balance-Leahy Scale: Good     Standing balance support: Bilateral upper extremity supported, No upper extremity supported, Single extremity supported, During functional activity Standing balance-Leahy  Scale: Fair Standing balance comment: Able to stand at sink without UE support but LOB when ambulating, benefitting from bil UE support to ambulate without LOB                             Pertinent Vitals/Pain Pain Assessment Pain Assessment: No/denies pain    Home Living Family/patient expects to be discharged to:: Private residence Living Arrangements: Spouse/significant other Available Help at Discharge: Family;Available 24 hours/day (wife limited in assistance she can provide as she uses an AD herself) Type of Home: House Home Access: Stairs to enter;Elevator Entrance Stairs-Rails: Doctor, general practice of Steps: 6   Home Layout: One level Home Equipment: Grab bars - tub/shower;Other (comment) (3-prong cane)      Prior Function Prior Level of Function : Independent/Modified Independent             Mobility Comments: indep without AD household and community distances ADLs Comments: modified indep with ADLs, including driving     Hand Dominance   Dominant Hand: Right    Extremity/Trunk Assessment   Upper Extremity Assessment Upper Extremity Assessment: Overall WFL for tasks assessed    Lower Extremity Assessment Lower Extremity Assessment: Generalized weakness (noted functionally)    Cervical / Trunk Assessment Cervical / Trunk Assessment: Normal  Communication   Communication: No difficulties  Cognition Arousal/Alertness: Awake/alert Behavior During Therapy: WFL for tasks assessed/performed Overall Cognitive Status: Within Functional Limits for tasks assessed                                          General Comments General comments (skin integrity, edema, etc.): SpO2 in 80s% at rest and with mobility on RA, >/= 92% with gait on 2L O2; educated pt on his risk for falls and recommendation to use rollator at d/c. He verbalized understanding    Exercises     Assessment/Plan    PT Assessment Patient needs  continued PT services  PT Problem List Decreased strength;Decreased activity tolerance;Decreased mobility;Decreased balance;Cardiopulmonary status limiting activity       PT Treatment Interventions DME instruction;Gait training;Stair training;Functional mobility training;Therapeutic activities;Therapeutic exercise;Balance training;Neuromuscular re-education;Patient/family education    PT Goals (Current goals can be found in the Care Plan section)  Acute Rehab PT Goals Patient Stated Goal: to not need supplemental O2 long term PT Goal Formulation: With patient Time For Goal Achievement: 05/13/22 Potential to Achieve Goals: Good    Frequency Min 3X/week     Co-evaluation               AM-PAC PT "6 Clicks" Mobility  Outcome Measure Help needed turning from your back to your side while in a flat bed without using bedrails?: None Help needed moving from lying on your back to sitting on the side of a flat bed without using bedrails?: None Help needed moving to and from a bed to a chair (including a wheelchair)?: A Little Help needed standing up from a chair using your arms (e.g., wheelchair or bedside chair)?: A Little Help needed to walk in hospital room?: A Little Help needed climbing 3-5 steps with a railing? : A Little 6 Click Score: 20    End  of Session Equipment Utilized During Treatment: Oxygen Activity Tolerance: Patient tolerated treatment well Patient left: in bed;with call bell/phone within reach;with bed alarm set Nurse Communication: Mobility status;Other (comment) (sats) PT Visit Diagnosis: Unsteadiness on feet (R26.81);Other abnormalities of gait and mobility (R26.89);Muscle weakness (generalized) (M62.81);Difficulty in walking, not elsewhere classified (R26.2)    Time: 1751-0258 PT Time Calculation (min) (ACUTE ONLY): 24 min   Charges:   PT Evaluation $PT Eval Moderate Complexity: 1 Mod PT Treatments $Gait Training: 8-22 mins        Raymond Gurney, PT,  DPT Acute Rehabilitation Services  Pager: [phone] Office: [phone]   Jewel Baize 04/29/2022, 1:52 PM

## 2022-04-29 NOTE — TOC Initial Note (Signed)
Transition of Care [facility]) - Initial/Assessment Note    Patient Details  Name: Mitchell Barnes MRN: [id number] Date of Birth: 1946-10-05  Transition of [facility]) CM/SW Contact:    Makyle Eslick G., RN Phone Number: 04/29/2022, 9:35 AM  Clinical Narrative:              76 year old male history of chronic systolic/diastolic heart failure EF of 35%, recent stroke this month, questionable mural thrombus on Eliquis, hyperlipidemia presents to the ER today with 3 to 4-day history of cough and worsening shortness of breath.  He was seen in the ER 2 days ago for tick bite.  He states that he was discharged from the hospital on a Thursday evening, he noticed some redness to his left thigh.  By Friday he had a rash.  He states that his wife dug out a tick out of his leg.  Patient states that his wife has pictures and they identified the tick as a [place] tick.  He was seen in the ER and started on doxycycline.  RNCM received order for DME-Oxygen.  RNCM contacted Andrew at [facility] with order and confirmation received.  Oxygen to be delivered to patient's room prior to discharge.     Expected Discharge Plan: Home/Self Care Barriers to Discharge: No Barriers Identified  Patient Goals and CMS Choice Patient states their goals for this hospitalization and ongoing recovery are:: to be able to return home CMS Medicare.gov Compare Post Acute Care list provided to:: Patient Choice offered to / list presented to : Patient  Expected Discharge Plan and Services Expected Discharge Plan: Home/Self Care   Discharge Planning Services: CM Consult Post Acute Care Choice: Durable Medical Equipment Living arrangements for the past 2 months: Single Family Home                 DME Arranged: Oxygen DME Agency: [employer] Date DME Agency Contacted: 04/29/22 Time DME Agency Contacted: [phone] Representative spoke with at DME Agency: [place]: NA [place] Agency: NA       Prior Living  Arrangements/Services Living arrangements for the past 2 months: [place] with:: Spouse Patient language and need for interpreter reviewed:: Yes Do you feel safe going back to the place where you live?: Yes      Need for Family Participation in Patient Care: Yes (Comment) Care giver support system in place?: Yes (comment)   Criminal Activity/Legal Involvement Pertinent to Current Situation/Hospitalization: No - Comment as needed  Activities of Daily Living Home Assistive Devices/Equipment: Cane (specify quad or straight) ADL Screening (condition at time of admission) Patient's cognitive ability adequate to safely complete daily activities?: Yes Is the patient deaf or have difficulty hearing?: No Does the patient have difficulty seeing, even when wearing glasses/contacts?: No Does the patient have difficulty concentrating, remembering, or making decisions?: No Patient able to express need for assistance with ADLs?: Yes Does the patient have difficulty dressing or bathing?: No Independently performs ADLs?: Yes (appropriate for developmental age) Does the patient have difficulty walking or climbing stairs?: Yes Weakness of Legs: Both Weakness of Arms/Hands: None  Permission Sought/Granted Permission sought to share information with : Case Manager          Emotional Assessment Appearance:: Appears stated age   Affect (typically observed): Accepting Orientation: : Oriented to Self, Oriented to Place, Oriented to  Time, Oriented to Situation   Psych Involvement: No (comment)  Admission diagnosis:  Acute hypoxemic respiratory failure due to COVID-19 ([place]) [U07.1, J96.01]  COVID-19 virus infection [U07.1] Patient Active Problem List   Diagnosis Date Noted   Aortic atherosclerosis ([place]) 04/28/2022   CKD stage IIIa 04/28/2022   COVID-19 virus infection 04/27/2022   Acute respiratory failure with hypoxia ([place]) 04/27/2022   [place] tick-associated rash illness  04/27/2022   DNR (do not resuscitate)/DNI(Do Not Intubate) 04/27/2022   Chronic combined systolic and diastolic CHF (congestive heart failure) ([place]) 04/19/2022   CVA (cerebral vascular accident) ([place]) 04/18/2022   Anemia    Coronary artery calcification seen on CT scan 04/20/2020   Hyperlipidemia LDL goal <70 04/20/2020   Essential hypertension 04/20/2020   Right bundle branch block 04/20/2020   Aortic root dilation ([place]) 04/20/2020   Seizure ([place])    Hyperlipidemia    PCP:  Leeroy Cha, MD Pharmacy:   [id number] - [place], [place] [address] [place] [address] Phone: [phone] Fax: [phone]  Social Determinants of Health (SDOH) Interventions   Readmission Risk Interventions     View : No data to display.

## 2022-05-02 LAB — CULTURE, BLOOD (ROUTINE X 2): Culture: NO GROWTH

## 2022-05-03 DIAGNOSIS — J9601 Acute respiratory failure with hypoxia: Secondary | ICD-10-CM | POA: Diagnosis not present

## 2022-05-03 DIAGNOSIS — I5042 Chronic combined systolic (congestive) and diastolic (congestive) heart failure: Secondary | ICD-10-CM | POA: Diagnosis not present

## 2022-05-03 LAB — CULTURE, BLOOD (ROUTINE X 2): Culture: NO GROWTH

## 2022-05-04 ENCOUNTER — Telehealth: Payer: Self-pay | Admitting: Cardiovascular Disease

## 2022-05-04 NOTE — Telephone Encounter (Signed)
I spoke to the patient and discussed upcoming "New Coumadin" visit.  He verbalized understanding

## 2022-05-04 NOTE — Telephone Encounter (Signed)
Patient looking to get a have a coumadin appt done. Please advise

## 2022-05-04 NOTE — Telephone Encounter (Signed)
If Home Health RN is calling please get Coumadin Nurse on the phone STAT  1.  Are you calling in regards to an appointment? Yes , Scheduled now for new coumadin appt [place] 6/7  2.  Are you calling for a refill ? no  3.  Are you having bleeding issues? no  4.  Do you need clearance to hold Coumadin? No  Per hospital dc changed to warfarin and doing lovenox at home.  Patient states only 2 doses of lovenox left and is wanting advise or to confirm instructions while waiting on clinic visit.   Please call to discuss .    Please route to the Coumadin Clinic Pool

## 2022-05-09 ENCOUNTER — Ambulatory Visit: Payer: Medicare Other

## 2022-05-09 ENCOUNTER — Other Ambulatory Visit: Payer: Self-pay

## 2022-05-09 DIAGNOSIS — I639 Cerebral infarction, unspecified: Secondary | ICD-10-CM | POA: Diagnosis not present

## 2022-05-09 DIAGNOSIS — Z5181 Encounter for therapeutic drug level monitoring: Secondary | ICD-10-CM

## 2022-05-09 DIAGNOSIS — I4891 Unspecified atrial fibrillation: Secondary | ICD-10-CM

## 2022-05-09 DIAGNOSIS — Z7901 Long term (current) use of anticoagulants: Secondary | ICD-10-CM

## 2022-05-09 LAB — POCT INR: INR: 2.9 (ref 2.0–3.0)

## 2022-05-09 NOTE — Progress Notes (Unsigned)
New Coumadin Start

## 2022-05-09 NOTE — Patient Instructions (Signed)
TAKE 1 TABLET DAILY.  INR in 1 week.  A full discussion of the nature of anticoagulants has been carried out.  A benefit risk analysis has been presented to the patient, so that they understand the justification for choosing anticoagulation at this time. The need for frequent and regular monitoring, precise dosage adjustment and compliance is stressed.  Side effects of potential bleeding are discussed.  The patient should avoid any OTC items containing aspirin or ibuprofen, and should avoid great swings in general diet.  Avoid alcohol consumption.  Call if any signs of abnormal bleeding.  [phone]  

## 2022-05-11 DIAGNOSIS — Z8673 Personal history of transient ischemic attack (TIA), and cerebral infarction without residual deficits: Secondary | ICD-10-CM | POA: Diagnosis not present

## 2022-05-11 DIAGNOSIS — I5042 Chronic combined systolic (congestive) and diastolic (congestive) heart failure: Secondary | ICD-10-CM | POA: Diagnosis not present

## 2022-05-11 DIAGNOSIS — D6859 Other primary thrombophilia: Secondary | ICD-10-CM | POA: Diagnosis not present

## 2022-05-16 ENCOUNTER — Ambulatory Visit: Payer: Medicare Other

## 2022-05-16 DIAGNOSIS — Z7901 Long term (current) use of anticoagulants: Secondary | ICD-10-CM | POA: Diagnosis not present

## 2022-05-16 DIAGNOSIS — I639 Cerebral infarction, unspecified: Secondary | ICD-10-CM

## 2022-05-16 DIAGNOSIS — I4891 Unspecified atrial fibrillation: Secondary | ICD-10-CM | POA: Diagnosis not present

## 2022-05-16 LAB — POCT INR: INR: 1.6 — AB (ref 2.0–3.0)

## 2022-05-16 NOTE — Patient Instructions (Signed)
Description   Take 1.5 tablets today and tomorrow, then start taking 1 tablet daily except 1.5 tablets on Mondays.  Recheck INR in 1 week.

## 2022-05-22 NOTE — Progress Notes (Unsigned)
Cardiology Office Note  Date:  05/23/2022   ID:  Mitchell Barnes, DOB 1946/03/02, MRN [id number]  PCP:  Lorenda Ishihara, MD   Chief Complaint  Patient presents with   Other    C/o right ankle edema/Acute hypoxemic respiratory failure. Meds reviewed verbally with pt.    HPI:  Mitchell Barnes is a 76 y.o. male with a hx of seizures,  Anemia, HLD,  HTN,   Nonsmoker, no diabetes HAB1C 5.8 Aortic atherosclerosis with aneurysmal dilatation of the aortic root measuring 4.4 cm Cardiomyopathy 04/2022 chest pain, prior hospitalization August 2021, negative stress test Who presents for follow-up of his chest pain  LOV 11/21 Several recent hospital admissions in Apr 18 2021 (cva) and May 22-26 (covid) Recent hospital admission with COVID-19 infection hypoxic respiratory failure Apr 27, 2022 Recent stroke on Eliquis and aspirin  Echocardiogram Apr 19, 2022 after CVA ejection fraction 35 to 40%, severe global hypokinesis, unable to exclude stress cardiomyopathy Unable to exclude mural thrombus, Was started on anticoagulation  Prior ejection fraction in May 2021 was 55 to 60%  Being treated with Doxy for tick bite On warfarin, has INR check today  Breathing better,  Home oxygen 85-87 without oxugen With oxygen >95%  Reports that his peripheral vision is dramatically improved compared to initial onset of stroke  EKG personally reviewed by myself on todays visit Shows normal sinus rhythm rate 86 bpm right bundle branch block  Other past medical history reviewed Echo 04/16/20 LVEF 55-60%, mid LVH, gr1DD, RV normal size and function, mild dilation aortic root 47mm.  Lexiscan myoview 04/16/20 with no significant ischemia, no EKG changes, CT attenuation correction images with coronary calcification and minimal aortic atherosclerosis.     lexiscan Myoview 04/16/20 Study Result   Pharmacological myocardial perfusion imaging study with no significant  ischemia Normal  wall motion, EF estimated at 81% No EKG changes concerning for ischemia at peak stress or in recovery. CT attenuation correction images with coronary calcification noted, minimal aortic atherosclerosis Low risk scan      Echo 04/16/20  1. Left ventricular ejection fraction, by estimation, is 55 to 60%. The  left ventricle has normal function. The left ventricle has no regional  wall motion abnormalities. There is mild left ventricular hypertrophy.  Left ventricular diastolic parameters  are consistent with Grade I diastolic dysfunction (impaired relaxation).   2. Right ventricular systolic function is normal. The right ventricular  size is normal. Tricuspid regurgitation signal is inadequate for assessing  PA pressure.   3. There is mild dilatation of the aortic root measuring 37 mm.    PMH:   has a past medical history of Anemia, Aortic atherosclerosis (HCC) (04/28/2022), HLD (hyperlipidemia), and Seizures (HCC).  PSH:    Past Surgical History:  Procedure Laterality Date   EYE SURGERY      Current Outpatient Medications  Medication Sig Dispense Refill   acetaminophen (TYLENOL) 650 MG CR tablet Take 650 mg by mouth every 8 (eight) hours as needed for pain or fever.     diphenhydrAMINE (BENADRYL) 25 MG tablet Take 25 mg by mouth every 6 (six) hours as needed for itching.     ferrous sulfate 325 (65 FE) MG tablet Take 325 mg by mouth every Monday, Wednesday, and Friday. Monday, Thursday     PHENobarbital (LUMINAL) 32.4 MG tablet Take 64.8 mg by mouth 2 (two) times daily.     rosuvastatin (CRESTOR) 20 MG tablet Take 1 tablet (20 mg total) by mouth daily. 30  tablet 11   vitamin B-12 (CYANOCOBALAMIN) 500 MCG tablet Take 500 mcg by mouth daily.     Vitamin D, Ergocalciferol, (DRISDOL) 1.25 MG (50000 UNIT) CAPS capsule Take 50,000 Units by mouth every Tuesday.     warfarin (COUMADIN) 5 MG tablet Take 1 tablet (5 mg total) by mouth daily at 4 PM. 30 tablet 0   No current  facility-administered medications for this visit.     Allergies:   Atorvastatin, Dilantin [phenytoin], and Fluconazole   Social History:  The patient  reports that he has never smoked. He has never used smokeless tobacco. He reports current alcohol use. He reports that he does not use drugs.   Family History:   family history includes Heart attack in his father.   Review of Systems: Review of Systems  Constitutional: Negative.   HENT: Negative.    Respiratory: Negative.    Cardiovascular: Negative.   Gastrointestinal: Negative.   Musculoskeletal: Negative.   Neurological: Negative.   Psychiatric/Behavioral: Negative.    All other systems reviewed and are negative.   PHYSICAL EXAM: VS:  BP 114/71 (BP Location: Left Arm, Patient Position: Sitting, Cuff Size: Normal)   Pulse 86   Ht 5' 8.5" (1.74 m)   Wt 179 lb 8 oz (81.4 kg)   SpO2 96%   BMI 26.90 kg/m  , BMI Body mass index is 26.9 kg/m. Constitutional:  oriented to person, place, and time. No distress.  HENT:  Head: Grossly normal Eyes:  no discharge. No scleral icterus.  Neck: No JVD, no carotid bruits  Cardiovascular: Regular rate and rhythm, no murmurs appreciated Pulmonary/Chest: Clear to auscultation bilaterally, no wheezes or rails Abdominal: Soft.  no distension.  no tenderness.  Musculoskeletal: Normal range of motion Neurological:  normal muscle tone. Coordination normal. No atrophy Skin: Skin warm and dry Psychiatric: normal affect, pleasant  Recent Labs: 04/28/2022: ALT 23; BUN 17; Creatinine, Ser 1.29; Hemoglobin 11.6; Magnesium 2.2; Platelets 182; Potassium 4.1; Sodium 134    Lipid Panel Lab Results  Component Value Date   CHOL 174 04/19/2022   HDL 71 04/19/2022   LDLCALC 91 04/19/2022   TRIG 61 04/19/2022      Wt Readings from Last 3 Encounters:  05/23/22 179 lb 8 oz (81.4 kg)  04/28/22 180 lb 5.4 oz (81.8 kg)  10/24/20 195 lb (88.5 kg)     ASSESSMENT AND PLAN:  Problem List Items  Addressed This Visit       Cardiology Problems   Essential hypertension   CVA (cerebral vascular accident) (HCC) - Primary   Relevant Orders   EKG 12-Lead   Coronary artery calcification seen on CT scan   Aortic root dilation (HCC)   Relevant Orders   EKG 12-Lead   Other Visit Diagnoses     Chest pain of uncertain etiology       Relevant Orders   EKG 12-Lead     Stroke Recent presentation and hospitalization discussed with him He has since recovered with improved peripheral vision Low ejection fraction, unable to exclude mural thrombus, he is being treated with warfarin  Hyperlipidemia Continue Crestor  Dilated aortic root 4.4 cm on recent imaging Annual follow-up  Stress cardiomyopathy Recommend he start Farxiga 10 mg daily, Coreg 3.125 twice daily Repeat echocardiogram next month Little room to advance his goal-directed medical therapy given low blood pressure Still on oxygen  Acute on chronic respiratory failure Recovered from COVID, still on oxygen reports hypoxia on room air on ambulation   Total encounter  time more than 40 minutes  Greater than 50% was spent in counseling and coordination of care with the patient   Signed, Dossie Arbour, M.D., Ph.D. [facility] [place], [place] [phone]

## 2022-05-23 ENCOUNTER — Ambulatory Visit: Payer: Medicare Other | Admitting: Cardiovascular Disease

## 2022-05-23 ENCOUNTER — Encounter: Payer: Self-pay | Admitting: Cardiovascular Disease

## 2022-05-23 ENCOUNTER — Ambulatory Visit: Payer: Medicare Other

## 2022-05-23 VITALS — BP 114/71 | HR 86 | Ht 68.5 in | Wt 179.5 lb

## 2022-05-23 DIAGNOSIS — I63422 Cerebral infarction due to embolism of left anterior cerebral artery: Secondary | ICD-10-CM

## 2022-05-23 DIAGNOSIS — Z7901 Long term (current) use of anticoagulants: Secondary | ICD-10-CM

## 2022-05-23 DIAGNOSIS — I639 Cerebral infarction, unspecified: Secondary | ICD-10-CM | POA: Diagnosis not present

## 2022-05-23 DIAGNOSIS — I77810 Thoracic aortic ectasia: Secondary | ICD-10-CM

## 2022-05-23 DIAGNOSIS — R079 Chest pain, unspecified: Secondary | ICD-10-CM

## 2022-05-23 DIAGNOSIS — I10 Essential (primary) hypertension: Secondary | ICD-10-CM

## 2022-05-23 DIAGNOSIS — I2510 Atherosclerotic heart disease of native coronary artery without angina pectoris: Secondary | ICD-10-CM

## 2022-05-23 DIAGNOSIS — I4891 Unspecified atrial fibrillation: Secondary | ICD-10-CM

## 2022-05-23 LAB — POCT INR: INR: 1.6 — AB (ref 2.0–3.0)

## 2022-05-23 MED ORDER — WARFARIN SODIUM 5 MG PO TABS: ORAL_TABLET | ORAL | 0 refills | Status: DC

## 2022-05-23 MED ORDER — DAPAGLIFLOZIN PROPANEDIOL 10 MG PO TABS: 10.0000 mg | ORAL_TABLET | Freq: Every day | ORAL | 3 refills | Status: DC

## 2022-05-23 MED ORDER — CARVEDILOL 3.125 MG PO TABS: 3.1250 mg | ORAL_TABLET | Freq: Two times a day (BID) | ORAL | 3 refills | Status: DC

## 2022-05-23 NOTE — Patient Instructions (Addendum)
Medication Instructions:  Please start farxiga 10 mg daily Please start coreg 3.125 mg twice a day  If you need a refill on your cardiac medications before your next appointment, please call your pharmacy.   Lab work: No new labs needed  Testing/Procedures:  Your physician has requested that you have an echocardiogram in 2 months. Echocardiography is a painless test that uses sound waves to create images of your heart. It provides your doctor with information about the size and shape of your heart and how well your heart's chambers and valves are working. This procedure takes approximately one hour. There are no restrictions for this procedure.   Follow-Up: At [facility], you and your health needs are our priority.  As part of our continuing mission to provide you with exceptional heart care, we have created designated Provider Care Teams.  These Care Teams include your primary Cardiologist (physician) and Advanced Practice Providers (APPs -  Physician Assistants and Nurse Practitioners) who all work together to provide you with the care you need, when you need it.  You will need a follow up appointment in 3 months  Providers on your designated Care Team:   Nicolasa Ducking, NP Eula Listen, PA-C Cadence Fransico Michael, [place]  COVID-19 Vaccine Information can be found at: [email or web address] For questions related to vaccine distribution or appointments, please email [email or web address] or call [phone].

## 2022-05-23 NOTE — Patient Instructions (Signed)
Take 2 tablets today only then start taking 1 tablet daily except 1.5 tablets on Mondays and Fridays.  Recheck INR in 1 week.

## 2022-05-30 ENCOUNTER — Ambulatory Visit (INDEPENDENT_AMBULATORY_CARE_PROVIDER_SITE_OTHER): Payer: Medicare Other

## 2022-05-30 DIAGNOSIS — I639 Cerebral infarction, unspecified: Secondary | ICD-10-CM | POA: Diagnosis not present

## 2022-05-30 DIAGNOSIS — Z7901 Long term (current) use of anticoagulants: Secondary | ICD-10-CM | POA: Diagnosis not present

## 2022-05-30 LAB — POCT INR: INR: 1.9 — AB (ref 2.0–3.0)

## 2022-05-30 NOTE — Patient Instructions (Signed)
Take 2 tablets today only then start taking 1 tablet daily except 1.5 tablets on Mondays, Wednesdays and Fridays.  Recheck INR in 2 weeks.

## 2022-06-02 DIAGNOSIS — I5042 Chronic combined systolic (congestive) and diastolic (congestive) heart failure: Secondary | ICD-10-CM | POA: Diagnosis not present

## 2022-06-02 DIAGNOSIS — J9601 Acute respiratory failure with hypoxia: Secondary | ICD-10-CM | POA: Diagnosis not present

## 2022-06-13 ENCOUNTER — Ambulatory Visit: Payer: Medicare Other

## 2022-06-13 DIAGNOSIS — Z7901 Long term (current) use of anticoagulants: Secondary | ICD-10-CM | POA: Diagnosis not present

## 2022-06-13 DIAGNOSIS — I639 Cerebral infarction, unspecified: Secondary | ICD-10-CM

## 2022-06-13 DIAGNOSIS — I4891 Unspecified atrial fibrillation: Secondary | ICD-10-CM

## 2022-06-13 LAB — POCT INR: INR: 2 (ref 2.0–3.0)

## 2022-06-13 NOTE — Patient Instructions (Signed)
Description   Continue on same dosage 1 tablet daily except 1.5 tablets on Mondays, Wednesdays and Fridays.  Recheck INR in 3 weeks.

## 2022-06-15 ENCOUNTER — Other Ambulatory Visit: Payer: Self-pay | Admitting: Cardiovascular Disease

## 2022-06-15 DIAGNOSIS — I4891 Unspecified atrial fibrillation: Secondary | ICD-10-CM

## 2022-06-15 NOTE — Telephone Encounter (Signed)
Refill request

## 2022-06-15 NOTE — Telephone Encounter (Signed)
Prescription refill request received for warfarin Lov: 05/23/22 Mitchell Barnes)  Next INR check: 07/04/22 Warfarin tablet strength: 5mg   Appropriate dose and refill sent to requested pharmacy.

## 2022-07-03 ENCOUNTER — Ambulatory Visit: Payer: Medicare Other | Admitting: Cardiovascular Disease

## 2022-07-03 ENCOUNTER — Other Ambulatory Visit: Payer: Self-pay | Admitting: Internal Medicine

## 2022-07-03 ENCOUNTER — Ambulatory Visit
Admission: RE | Admit: 2022-07-03 | Discharge: 2022-07-03 | Disposition: A | Discharge status: Home / Self Care | Payer: Medicare Other | Source: Ambulatory Visit | Attending: Internal Medicine | Admitting: Internal Medicine

## 2022-07-03 DIAGNOSIS — I5042 Chronic combined systolic (congestive) and diastolic (congestive) heart failure: Secondary | ICD-10-CM | POA: Diagnosis not present

## 2022-07-03 DIAGNOSIS — J9601 Acute respiratory failure with hypoxia: Secondary | ICD-10-CM | POA: Diagnosis not present

## 2022-07-03 DIAGNOSIS — R918 Other nonspecific abnormal finding of lung field: Secondary | ICD-10-CM | POA: Diagnosis not present

## 2022-07-03 DIAGNOSIS — Z8701 Personal history of pneumonia (recurrent): Secondary | ICD-10-CM

## 2022-07-04 ENCOUNTER — Ambulatory Visit (INDEPENDENT_AMBULATORY_CARE_PROVIDER_SITE_OTHER): Payer: Medicare Other

## 2022-07-04 DIAGNOSIS — I639 Cerebral infarction, unspecified: Secondary | ICD-10-CM | POA: Diagnosis not present

## 2022-07-04 DIAGNOSIS — Z7901 Long term (current) use of anticoagulants: Secondary | ICD-10-CM

## 2022-07-04 LAB — POCT INR: INR: 2.1 (ref 2.0–3.0)

## 2022-07-04 NOTE — Patient Instructions (Signed)
Continue on same dosage 1 tablet daily except 1.5 tablets on Mondays, Wednesdays and Fridays.  Recheck INR in 5 weeks.

## 2022-07-08 ENCOUNTER — Other Ambulatory Visit: Payer: Self-pay | Admitting: Cardiovascular Disease

## 2022-07-08 DIAGNOSIS — I4891 Unspecified atrial fibrillation: Secondary | ICD-10-CM

## 2022-07-09 NOTE — Telephone Encounter (Signed)
Refill request

## 2022-07-13 DIAGNOSIS — Z8673 Personal history of transient ischemic attack (TIA), and cerebral infarction without residual deficits: Secondary | ICD-10-CM | POA: Diagnosis not present

## 2022-07-13 DIAGNOSIS — R569 Unspecified convulsions: Secondary | ICD-10-CM | POA: Diagnosis not present

## 2022-07-13 DIAGNOSIS — R0902 Hypoxemia: Secondary | ICD-10-CM | POA: Diagnosis not present

## 2022-07-13 DIAGNOSIS — J9811 Atelectasis: Secondary | ICD-10-CM | POA: Diagnosis not present

## 2022-07-25 ENCOUNTER — Ambulatory Visit (INDEPENDENT_AMBULATORY_CARE_PROVIDER_SITE_OTHER): Payer: Medicare Other

## 2022-07-25 DIAGNOSIS — I428 Other cardiomyopathies: Secondary | ICD-10-CM

## 2022-07-25 DIAGNOSIS — I77810 Thoracic aortic ectasia: Secondary | ICD-10-CM

## 2022-07-25 LAB — ECHOCARDIOGRAM LIMITED
Area-P 1/2: 3.81 cm2
S' Lateral: 3.5 cm

## 2022-07-25 MED ORDER — PERFLUTREN LIPID MICROSPHERE
1.0000 mL | INTRAVENOUS | Status: AC | PRN
  Administered 2022-07-25: 2 mL via INTRAVENOUS

## 2022-08-03 DIAGNOSIS — I5042 Chronic combined systolic (congestive) and diastolic (congestive) heart failure: Secondary | ICD-10-CM | POA: Diagnosis not present

## 2022-08-03 DIAGNOSIS — J9601 Acute respiratory failure with hypoxia: Secondary | ICD-10-CM | POA: Diagnosis not present

## 2022-08-08 ENCOUNTER — Telehealth: Payer: Self-pay | Admitting: Cardiovascular Disease

## 2022-08-08 ENCOUNTER — Ambulatory Visit: Payer: Medicare Other | Attending: Cardiovascular Disease

## 2022-08-08 DIAGNOSIS — Z7901 Long term (current) use of anticoagulants: Secondary | ICD-10-CM | POA: Diagnosis not present

## 2022-08-08 DIAGNOSIS — I639 Cerebral infarction, unspecified: Secondary | ICD-10-CM | POA: Diagnosis not present

## 2022-08-08 LAB — POCT INR: INR: 1.6 — AB (ref 2.0–3.0)

## 2022-08-08 NOTE — Telephone Encounter (Signed)
Pt c/o BP issue: STAT if pt c/o blurred vision, one-sided weakness or slurred speech  1. What are your last 5 BP readings? 157/108  2. Are you having any other symptoms (ex. Dizziness, headache, blurred vision, passed out)? Gets a little unsteady on feet every once in a while when moving  3. What is your BP issue? Patient states he has noticed that after a shower his oxygen will be at 82% even though he has his cord on. He also says if he gets up and walks his oxygen drops to 85-84%. He says his BP was been higher and has had some unsteadiness on his feel. He says he feels like he might trip or fall when moving for the last 2 weeks. He says he also has been going to bed and gets up because he "hurts like crazy". He says he will sit up in a chair and and will not hurt anymore. He states he is not sure if it is a kidney problem or just a bed lumpy. He has a coumadin appointment today and states he could talk to someone in the office when he comes in.

## 2022-08-08 NOTE — Telephone Encounter (Signed)
Spoke w/ pt.   He reports that he takes his BP when he starts feeling strange, his highest SBP has been 158 and the lowest 107, taken at random times thru the day.  When sitting in his recliner, his O2 is typically 97-98%, but when he walks about 10 ft to the bathroom, it will drop to 81%.  He took a sit down shower and wore his O2 the whole time and his O2 dropped to 77%.   He has had 3 nosebleed in the past 4 wks w/ yellow mucous at times.  Advised him to try to use a humidifier and keep his nasal passages moist, as the skin inside his nose is very thin.   He typically goes to bed around 9:30-10:00 and gets up around 3 am and his back will hurt until he sits up for awhile.   He thinks it is his mattress.   Pt has an INR check this afternoon and appt w/ Cadence Fransico Michael, NP on 9/21.  Advised pt that he has several complaints that should probably be addressed w/ a visit.  Advised him to contact his pulmonologist re his O2 and his PCP re his back pain.  He will keep upcoming appts w/ [place] and call back if he feels he needs to be seen sooner.  He is appreciative of the call.

## 2022-08-08 NOTE — Patient Instructions (Signed)
TAKE 2 TABLETS TODAY ONLY and then Continue on same dosage 1 tablet daily except 1.5 tablets on Mondays, Wednesdays and Fridays.  Recheck INR in 3 weeks.

## 2022-08-23 ENCOUNTER — Ambulatory Visit: Payer: Medicare Other | Attending: Medical | Admitting: Medical

## 2022-08-23 ENCOUNTER — Encounter: Payer: Self-pay | Admitting: Medical

## 2022-08-23 VITALS — BP 130/74 | HR 70 | Ht 68.0 in | Wt 183.4 lb

## 2022-08-23 DIAGNOSIS — R0609 Other forms of dyspnea: Secondary | ICD-10-CM

## 2022-08-23 DIAGNOSIS — J9611 Chronic respiratory failure with hypoxia: Secondary | ICD-10-CM | POA: Diagnosis not present

## 2022-08-23 DIAGNOSIS — Z79899 Other long term (current) drug therapy: Secondary | ICD-10-CM | POA: Diagnosis not present

## 2022-08-23 DIAGNOSIS — I63422 Cerebral infarction due to embolism of left anterior cerebral artery: Secondary | ICD-10-CM

## 2022-08-23 DIAGNOSIS — I5022 Chronic systolic (congestive) heart failure: Secondary | ICD-10-CM | POA: Diagnosis not present

## 2022-08-23 DIAGNOSIS — I77810 Thoracic aortic ectasia: Secondary | ICD-10-CM

## 2022-08-23 DIAGNOSIS — I5042 Chronic combined systolic (congestive) and diastolic (congestive) heart failure: Secondary | ICD-10-CM

## 2022-08-23 DIAGNOSIS — I513 Intracardiac thrombosis, not elsewhere classified: Secondary | ICD-10-CM | POA: Diagnosis not present

## 2022-08-23 DIAGNOSIS — E782 Mixed hyperlipidemia: Secondary | ICD-10-CM

## 2022-08-23 MED ORDER — ENTRESTO 24-26 MG PO TABS: 1.0000 | ORAL_TABLET | Freq: Two times a day (BID) | ORAL | 6 refills | Status: DC

## 2022-08-23 NOTE — Progress Notes (Signed)
Cardiology Office Note:    Date:  08/23/2022   ID:  Mitchell Barnes, DOB [birth year], MRN [id number]  PCP:  Leeroy Cha, MD  [facility] Cardiologist:  Ida Rogue, MD  [place] Electrophysiologist:  None   Referring MD: Leeroy Cha,*   Chief Complaint: echo follow-up  History of Present Illness:    Mitchell Barnes is a 76 y.o. male with a hx of seizures, anemia, HLD, HTN, Chronic respiratory failure, aortic atherosclerosis with aneurysmal dilation of the aortic root measuring 4.4cm who presents for follow-up.   Presented to urgent care with chest pain and subsequently presented to [facility] ED. HS troponin 30 then 144. Suspected demand ischemia in setting of elevated BP. He was trialed on beta blocker, but bradycardic and this was discontinued. Echo 04/16/20 LVEF 55-60%, mid LVH, gr1DD, RV normal size and function, mild dilation aortic root 87mm.  Lexiscan myoview 04/16/20 with no significant ischemia, no EKG changes, CT attenuation correction images with coronary calcification and minimal aortic atherosclerosis. Recommended to transition from Pravastatin to Atorvastatin.   Patient was admitted for a stroke 04/2022. Echo at that time showed LVEF 35-40%, severe global HK, G1DD, unable to exclude mural thrombus, mild aortic root dilation measuring 62mm. He was started on anticoagulation with warfarin.   He was subsequently admitted in late May 2023 for COVID-19 infection and was sent home on 2L O2.   He was seen 05/23/22  by cardiology for low EF and was started on GDTM for cardiomyopathy. Repeat limted echo showed LVEF 50-55%.   Today, the patient reports O2 is dropping when he walks. O2 eventually goes up to 98%. He uses 2L O2. He denies chest pain. He reports lower leg edema once a week for two days. Could be something he eats. We discussed echo in detail today. He also reported overall feeling tired some days. He has a lot of questions regarding his O2,  which I suggested he ask PCP. He denies chest pain.  Past Medical History:  Diagnosis Date   Anemia    Aortic atherosclerosis ([place]) 04/28/2022   HLD (hyperlipidemia)    Seizures (HCC)     Past Surgical History:  Procedure Laterality Date   EYE SURGERY      Current Medications: Current Meds  Medication Sig   acetaminophen (TYLENOL) 650 MG CR tablet Take 650 mg by mouth every 8 (eight) hours as needed for pain or fever.   carvedilol (COREG) 3.125 MG tablet Take 1 tablet (3.125 mg total) by mouth 2 (two) times daily with a meal.   dapagliflozin propanediol (FARXIGA) 10 MG TABS tablet Take 1 tablet (10 mg total) by mouth daily before breakfast.   diphenhydrAMINE (BENADRYL) 25 MG tablet Take 25 mg by mouth every 6 (six) hours as needed for itching.   ferrous sulfate 325 (65 FE) MG tablet Take 325 mg by mouth every Monday, Wednesday, and Friday. Monday, Thursday   PHENobarbital (LUMINAL) 32.4 MG tablet Take 64.8 mg by mouth 2 (two) times daily.   rosuvastatin (CRESTOR) 20 MG tablet Take 1 tablet (20 mg total) by mouth daily.   sacubitril-valsartan (ENTRESTO) 24-26 MG Take 1 tablet by mouth 2 (two) times daily.   vitamin B-12 (CYANOCOBALAMIN) 500 MCG tablet Take 500 mcg by mouth daily.   Vitamin D, Ergocalciferol, (DRISDOL) 1.25 MG (50000 UNIT) CAPS capsule Take 50,000 Units by mouth every Tuesday.   warfarin (COUMADIN) 5 MG tablet TAKE 1-2 TABLETS DAILY OR AS PRESCRIBED BY CLINIC     Allergies:  Atorvastatin, Dilantin [phenytoin], and Fluconazole   Social History   Socioeconomic History   Marital status: Married    Spouse name: Not on file   Number of children: Not on file   Years of education: Not on file   Highest education level: Not on file  Occupational History   Not on file  Tobacco Use   Smoking status: Never   Smokeless tobacco: Never  Vaping Use   Vaping Use: Never used  Substance and Sexual Activity   Alcohol use: Yes   Drug use: No   Sexual activity: Yes   Other Topics Concern   Not on file  Social History Narrative   Not on file   Social Determinants of Health   Financial Resource Strain: Not on file  Food Insecurity: Not on file  Transportation Needs: Not on file  Physical Activity: Not on file  Stress: Not on file  Social Connections: Not on file     Family History: The patient's family history includes Heart attack in his father.  ROS:   Please see the history of present illness.     All other systems reviewed and are negative.  EKGs/Labs/Other Studies Reviewed:    The following studies were reviewed today:   Echo limited 07/2022  1. Left ventricular ejection fraction, by estimation, is 50 to 55%. The  left ventricle has low normal function.   2. The mitral valve is normal in structure. No evidence of mitral valve  regurgitation.   3. The aortic valve is normal in structure. Aortic valve regurgitation is  not visualized.   Echo 04/2022  1. Left ventricular ejection fraction, by estimation, is 35 to 40%. The  left ventricle has moderately decreased function. The left ventricle  demonstrates severe global hypokinesis, basal regions best preserved  (possibly consistent with stress  cardiomyopathy). The left ventricular internal cavity size was mildly  dilated. Left ventricular diastolic parameters are consistent with Grade I  diastolic dysfunction (impaired relaxation).   2. Unable to exclude mural thrombus   3. Right ventricular systolic function is normal. The right ventricular  size is normal.   4. The mitral valve is normal in structure. No evidence of mitral valve  regurgitation. No evidence of mitral stenosis.   5. The aortic valve is normal in structure. Aortic valve regurgitation is  not visualized. Aortic valve sclerosis is present, with no evidence of  aortic valve stenosis.   6. There is mild dilatation of the aortic root, measuring 40 mm.   7. The inferior vena cava is normal in size with greater than 50%   respiratory variability, suggesting right atrial pressure of 3 mmHg.   Myoview Lexiscan 2021 Narrative & Impression  Pharmacological myocardial perfusion imaging study with no significant  ischemia Normal wall motion, EF estimated at 81% No EKG changes concerning for ischemia at peak stress or in recovery. CT attenuation correction images with coronary calcification noted, minimal aortic atherosclerosis Low risk scan     Signed, Esmond Plants, MD, Ph.D [facility]    EKG:  EKG is ordered today.  The ekg ordered today demonstrates NSR 70bpm, LAD, RBBB, LAFB, nonspecific ST/T wave changes  Recent Labs: 04/28/2022: ALT 23; BUN 17; Creatinine, Ser 1.29; Hemoglobin 11.6; Magnesium 2.2; Platelets 182; Potassium 4.1; Sodium 134  Recent Lipid Panel    Component Value Date/Time   CHOL 174 04/19/2022 0423   TRIG 61 04/19/2022 0423   HDL 71 04/19/2022 0423   CHOLHDL 2.5 04/19/2022 0423   VLDL  12 04/19/2022 0423   LDLCALC 91 04/19/2022 0423   Physical Exam:    VS:  BP 130/74 (BP Location: Left Arm, Patient Position: Sitting, Cuff Size: Normal)   Pulse 70   Ht 5\' 8"  (1.727 m)   Wt 183 lb 6.4 oz (83.2 kg)   SpO2 98% Comment: 2L of O2  BMI 27.89 kg/m     Wt Readings from Last 3 Encounters:  08/23/22 183 lb 6.4 oz (83.2 kg)  05/23/22 179 lb 8 oz (81.4 kg)  04/28/22 180 lb 5.4 oz (81.8 kg)     GEN:  Well nourished, well developed in no acute distress HEENT: Normal NECK: No JVD; No carotid bruits LYMPHATICS: No lymphadenopathy CARDIAC: RRR, no murmurs, rubs, gallops RESPIRATORY:  Clear to auscultation without rales, wheezing or rhonchi  ABDOMEN: Soft, non-tender, non-distended MUSCULOSKELETAL:  No edema; No deformity  SKIN: Warm and dry NEUROLOGIC:  Alert and oriented x 3 PSYCHIATRIC:  Normal affect   ASSESSMENT:    1. Chronic combined systolic and diastolic CHF (congestive heart failure) ([place])   2. Medication management   3. Chronic systolic heart failure ([place])   4.  Dyspnea on exertion   5. Chronic respiratory failure with hypoxia (HCC)   6. Mural thrombus of heart   7. Aortic root dilation (HCC)   8. Hyperlipidemia, mixed   9. Cerebrovascular accident (CVA) due to embolism of left anterior cerebral artery (HCC)    PLAN:    In order of problems listed above:  HFmrEF Echo during hospitalization for stroke showed LVEF 35-40%. He was started on Farxiga and Coreg and repeat echo showed LVEF 50-55%. The patient is euvolemic on exam today. Start Entresto 24-36mg  BID, BMET in 2 weeks. I recommended he monitor his BP at home.   DOE/Chronic respiratory failure on 2L O2 Suspected multifactorial given CHF, chronic anemia and COVID infection 04/2022. If shortness of breath dose not improve, may need to assess for ischemia. He will continue to follow with PCP.   Mural thrombus Unable to exclude mural thrombus on echo during hospitalization for stroke in 04/2022 and he was started on warfarin.   Dilated aortic root 4.4 on recent imaging. Plan for annual imaging.   HLD LDL 91. Continue Crestor 20mg  daily.   CVA 04/2022 With no residual defects. EF was found to be low, and could not exclude mural thrombus. Continue warfarin and Crestor.   Disposition: Follow up in 3 month(s) with MD/APP     Signed, Tashari Schoenfelder Ninfa Meeker, PA-C  08/23/2022 9:19 PM     [facility]

## 2022-08-23 NOTE — Patient Instructions (Addendum)
Medication Instructions:  - Your physician has recommended you make the following change in your medication:   1) START Entresto 24/26 mg: - take 1 tablet by mouth TWICE daily (or every 12 hours)  Samples Given: Entresto 24/26 mg Lot: [id number] Exp: 09/2023 # 1 bottle  *If you need a refill on your cardiac medications before your next appointment, please call your pharmacy*   Lab Work: - Your physician recommends that you return for lab work in: 2 weeks (around 09/06/22)   [place] at [facility] 1st desk on the right to check in (REGISTRATION)  Lab hours: Monday- Friday (7:30 am- 5:30 pm)   If you have labs (blood work) drawn today and your tests are completely normal, you will receive your results only by: MyChart Message (if you have MyChart) OR A paper copy in the mail If you have any lab test that is abnormal or we need to change your treatment, we will call you to review the results.   Testing/Procedures: - none ordered   Follow-Up: At [facility], you and your health needs are our priority.  As part of our continuing mission to provide you with exceptional heart care, we have created designated Provider Care Teams.  These Care Teams include your primary Cardiologist (physician) and Advanced Practice Providers (APPs -  Physician Assistants and Nurse Practitioners) who all work together to provide you with the care you need, when you need it.  We recommend signing up for the patient portal called "MyChart".  Sign up information is provided on this After Visit Summary.  MyChart is used to connect with patients for Virtual Visits (Telemedicine).  Patients are able to view lab/test results, encounter notes, upcoming appointments, etc.  Non-urgent messages can be sent to your provider as well.   To learn more about what you can do with MyChart, go to [email or web address].    Your next appointment:   3 month(s)  The format for your next appointment:    In Person  Provider:   You may see Ida Rogue, MD or one of the following Advanced Practice Providers on your designated Care Team:    Cadence Kathlen Mody, [place]   Other Instructions  Sacubitril; Valsartan Tablets What is this medication? SACUBITRIL; VALSARTAN (sak UE bi tril; val SAR tan) treats heart failure. It works by relaxing blood vessels, which decreases the amount of work the heart has to do. It also helps your kidneys remove more fluid and salt from your blood through the urine. It is a combination of a neprilysin inhibitor and an ARB. This medicine may be used for other purposes; ask your health care provider or pharmacist if you have questions. COMMON BRAND NAME(S): Entresto What should I tell my care team before I take this medication? They need to know if you have any of these conditions: Diabetes and take a medication that contains aliskiren High levels of potassium in the blood Kidney disease Liver disease Low blood pressure An unusual or allergic reaction to sacubitril, valsartan, other medications, foods, dyes, or preservatives Pregnant or trying to get pregnant Breast-feeding How should I use this medication? Take this medication by mouth. Take it as directed on the prescription label at the same time every day. You can take it with or without food. If it upsets your stomach, take it with food. Keep taking it unless your care team tells you to stop. Talk to your care team about the use of this medication in children. While it  may be prescribed for children as young as 1 year for selected conditions, precautions do apply. Overdosage: If you think you have taken too much of this medicine contact a poison control center or emergency room at once. NOTE: This medicine is only for you. Do not share this medicine with others. What if I miss a dose? If you miss a dose, take it as soon as you can. If it is almost time for your next dose, take only that dose. Do not take double  or extra doses. What may interact with this medication? Do not take this medication with any of the following: Aliskiren if you have diabetes Angiotensin-converting enzyme (ACE) inhibitors, such as benazepril, captopril, enalapril, fosinopril, lisinopril, or ramipril Tranylcypromine This medication may also interact with the following: Angiotensin II receptor blockers (ARBs), such as azilsartan, candesartan, eprosartan, irbesartan, losartan, olmesartan, telmisartan, or valsartan Celecoxib Lithium NSAIDS, medications for pain and inflammation, such as ibuprofen or naproxen Potassium-sparing diuretics, such as amiloride, spironolactone, and triamterene Potassium supplements This list may not describe all possible interactions. Give your health care provider a list of all the medicines, herbs, non-prescription drugs, or dietary supplements you use. Also tell them if you smoke, drink alcohol, or use illegal drugs. Some items may interact with your medicine. What should I watch for while using this medication? Tell your care team if your symptoms do not start to get better or if they get worse. Talk to your care team if you wish to become pregnant or think you might be pregnant. This medication can cause serious birth defects. This medication may affect your coordination, reaction time, or judgment. Do not drive or operate machinery until you know how this medication affects you. Sit up or stand slowly to reduce the risk of dizzy or fainting spells. Drinking alcohol with this medication can increase the risk of these side effects. Avoid salt substitutes unless you are told otherwise by your care team. What side effects may I notice from receiving this medication? Side effects that you should report to your care team as soon as possible: Allergic reactions or angioedema--skin rash, itching or hives, swelling of the face, eyes, lips, tongue, arms, or legs, trouble swallowing or breathing High  potassium level--muscle weakness, fast or irregular heartbeat Kidney injury--decrease in the amount of urine, swelling of the ankles, hands, or feet Low blood pressure--dizziness, feeling faint or lightheaded, blurry vision Side effects that usually do not require medical attention (report to your care team if they continue or are bothersome): Cough This list may not describe all possible side effects. Call your doctor for medical advice about side effects. You may report side effects to FDA at 1-800-FDA-1088. Where should I keep my medication? Keep out of the reach of children and pets. Store at room temperature between 20 and 25 degrees C (68 and 77 degrees F). Protect from moisture. Keep the container tightly closed. Get rid of any unused medication after the expiration date. To get rid of medications that are no longer needed or have expired: Take the medication to a take-back program. Check with your pharmacy or law enforcement to find a location. If you cannot return the medication, check the label or package insert to see if the medication should be thrown out in the garbage or flushed down the toilet. If you are not sure, ask your care team. If it is safe to put it in the trash, empty the medication out of the container. Mix the medication with cat litter, dirt,  coffee grounds, or other unwanted substance. Seal the mixture in a bag or container. Put it in the trash. NOTE: This sheet is a summary. It may not cover all possible information. If you have questions about this medicine, talk to your doctor, pharmacist, or health care provider.  2023 Elsevier/Gold Standard (2021-10-03 00:00:00)   Important Information About Sugar

## 2022-08-29 ENCOUNTER — Ambulatory Visit: Payer: Medicare Other | Attending: Cardiovascular Disease

## 2022-08-29 DIAGNOSIS — Z7901 Long term (current) use of anticoagulants: Secondary | ICD-10-CM | POA: Diagnosis not present

## 2022-08-29 DIAGNOSIS — I639 Cerebral infarction, unspecified: Secondary | ICD-10-CM | POA: Diagnosis not present

## 2022-08-29 LAB — POCT INR: INR: 1.5 — AB (ref 2.0–3.0)

## 2022-08-29 NOTE — Patient Instructions (Signed)
TAKE 2.5 TABLETS TODAY ONLY and then Continue on same dosage 1 tablet daily except 1.5 tablets on Mondays, Wednesdays and Fridays.  Recheck INR in 3 weeks.

## 2022-09-02 DIAGNOSIS — I5042 Chronic combined systolic (congestive) and diastolic (congestive) heart failure: Secondary | ICD-10-CM | POA: Diagnosis not present

## 2022-09-02 DIAGNOSIS — J9601 Acute respiratory failure with hypoxia: Secondary | ICD-10-CM | POA: Diagnosis not present

## 2022-09-05 ENCOUNTER — Other Ambulatory Visit
Admission: RE | Admit: 2022-09-05 | Discharge: 2022-09-05 | Disposition: A | Discharge status: Home / Self Care | Payer: Medicare Other | Attending: Medical | Admitting: Medical

## 2022-09-05 DIAGNOSIS — Z79899 Other long term (current) drug therapy: Secondary | ICD-10-CM | POA: Insufficient documentation

## 2022-09-05 DIAGNOSIS — I5042 Chronic combined systolic (congestive) and diastolic (congestive) heart failure: Secondary | ICD-10-CM | POA: Diagnosis not present

## 2022-09-05 LAB — BASIC METABOLIC PANEL
Anion gap: 5 (ref 5–15)
BUN: 28 mg/dL — ABNORMAL HIGH (ref 8–23)
CO2: 22 mmol/L (ref 22–32)
Calcium: 8.6 mg/dL — ABNORMAL LOW (ref 8.9–10.3)
Chloride: 111 mmol/L (ref 98–111)
Creatinine, Ser: 1.42 mg/dL — ABNORMAL HIGH (ref 0.61–1.24)
GFR, Estimated: 52 mL/min — ABNORMAL LOW (ref >60)
Glucose, Bld: 112 mg/dL — ABNORMAL HIGH (ref 70–99)
Potassium: 4.7 mmol/L (ref 3.5–5.1)
Sodium: 138 mmol/L (ref 135–145)

## 2022-09-10 ENCOUNTER — Telehealth: Payer: Self-pay | Admitting: Medical

## 2022-09-10 DIAGNOSIS — I5042 Chronic combined systolic (congestive) and diastolic (congestive) heart failure: Secondary | ICD-10-CM

## 2022-09-10 DIAGNOSIS — Z79899 Other long term (current) drug therapy: Secondary | ICD-10-CM

## 2022-09-10 NOTE — Telephone Encounter (Signed)
Cadence Ninfa Meeker, PA-C  09/07/2022  2:26 PM EDT     Labs showed dehydration with the Entresto, so lets stop this and re-check a BMET in 1 week.

## 2022-09-10 NOTE — Telephone Encounter (Signed)
I spoke with the patient regarding his lab results and Cadence Furth, PA's recommendations to:  1) STOP Entresto 2) Repeat a BMP in 1 week (at the [facility])   The patient voices understanding of these results and recommendations and is agreeable.

## 2022-09-17 ENCOUNTER — Ambulatory Visit
Admission: RE | Admit: 2022-09-17 | Discharge: 2022-09-17 | Disposition: A | Discharge status: Home / Self Care | Payer: Medicare Other | Source: Ambulatory Visit | Attending: Internal Medicine | Admitting: Internal Medicine

## 2022-09-17 ENCOUNTER — Other Ambulatory Visit: Payer: Self-pay | Admitting: Internal Medicine

## 2022-09-17 DIAGNOSIS — R059 Cough, unspecified: Secondary | ICD-10-CM | POA: Diagnosis not present

## 2022-09-17 DIAGNOSIS — R0902 Hypoxemia: Secondary | ICD-10-CM | POA: Diagnosis not present

## 2022-09-17 DIAGNOSIS — I509 Heart failure, unspecified: Secondary | ICD-10-CM | POA: Diagnosis not present

## 2022-09-18 ENCOUNTER — Emergency Department: Payer: Medicare Other

## 2022-09-18 ENCOUNTER — Other Ambulatory Visit: Payer: Self-pay

## 2022-09-18 ENCOUNTER — Emergency Department
Admission: EM | Admit: 2022-09-18 | Discharge: 2022-09-18 | Discharge status: Against Medical Advice | Payer: Medicare Other | Attending: Emergency Medicine | Admitting: Emergency Medicine

## 2022-09-18 DIAGNOSIS — R059 Cough, unspecified: Secondary | ICD-10-CM | POA: Diagnosis not present

## 2022-09-18 DIAGNOSIS — I2510 Atherosclerotic heart disease of native coronary artery without angina pectoris: Secondary | ICD-10-CM | POA: Insufficient documentation

## 2022-09-18 DIAGNOSIS — Z5321 Procedure and treatment not carried out due to patient leaving prior to being seen by health care provider: Secondary | ICD-10-CM | POA: Diagnosis not present

## 2022-09-18 DIAGNOSIS — R0602 Shortness of breath: Secondary | ICD-10-CM | POA: Insufficient documentation

## 2022-09-18 DIAGNOSIS — Z743 Need for continuous supervision: Secondary | ICD-10-CM | POA: Diagnosis not present

## 2022-09-18 DIAGNOSIS — N189 Chronic kidney disease, unspecified: Secondary | ICD-10-CM | POA: Insufficient documentation

## 2022-09-18 LAB — CBC
HCT: 34.3 % — ABNORMAL LOW (ref 39.0–52.0)
Hemoglobin: 10.9 g/dL — ABNORMAL LOW (ref 13.0–17.0)
MCH: 30.3 pg (ref 26.0–34.0)
MCHC: 31.8 g/dL (ref 30.0–36.0)
MCV: 95.3 fL (ref 80.0–100.0)
Platelets: 198 10*3/uL (ref 150–400)
RBC: 3.6 MIL/uL — ABNORMAL LOW (ref 4.22–5.81)
RDW: 12.1 % (ref 11.5–15.5)
WBC: 9.7 10*3/uL (ref 4.0–10.5)
nRBC: 0 % (ref 0.0–0.2)

## 2022-09-18 LAB — MAGNESIUM: Magnesium: 2.6 mg/dL — ABNORMAL HIGH (ref 1.7–2.4)

## 2022-09-18 LAB — COMPREHENSIVE METABOLIC PANEL
ALT: 12 U/L (ref 0–44)
AST: 19 U/L (ref 15–41)
Albumin: 3.4 g/dL — ABNORMAL LOW (ref 3.5–5.0)
Alkaline Phosphatase: 45 U/L (ref 38–126)
Anion gap: 6 (ref 5–15)
BUN: 23 mg/dL (ref 8–23)
CO2: 25 mmol/L (ref 22–32)
Calcium: 8.7 mg/dL — ABNORMAL LOW (ref 8.9–10.3)
Chloride: 108 mmol/L (ref 98–111)
Creatinine, Ser: 1.34 mg/dL — ABNORMAL HIGH (ref 0.61–1.24)
GFR, Estimated: 55 mL/min — ABNORMAL LOW (ref >60)
Glucose, Bld: 115 mg/dL — ABNORMAL HIGH (ref 70–99)
Potassium: 4.2 mmol/L (ref 3.5–5.1)
Sodium: 139 mmol/L (ref 135–145)
Total Bilirubin: 0.5 mg/dL (ref 0.3–1.2)
Total Protein: 7.8 g/dL (ref 6.5–8.1)

## 2022-09-18 LAB — BRAIN NATRIURETIC PEPTIDE: B Natriuretic Peptide: 237.7 pg/mL — ABNORMAL HIGH (ref 0.0–100.0)

## 2022-09-18 NOTE — ED Triage Notes (Signed)
Pt here with fluid on his lungs and possible PNA, pt told to come to ED to be evaluated. Pt denies SOB except when he is talking a lot. Pt denies pain. Pt denies coughing up phlegm.

## 2022-09-18 NOTE — ED Notes (Signed)
First nurse note: Pt here via [facility] with c/o of a new cough and wears 2L/min via O2.   VSS per EMS

## 2022-09-18 NOTE — ED Provider Triage Note (Signed)
Emergency Medicine Provider Triage Evaluation Note  Laron Boorman Martus, a 76 y.o. male  was evaluated in triage.  Pt complains of productive cough and intermittent shortness of breath.  Patient with a history of heart failure, HLD, CAD, anemia, and CKD, presents to the ED from his PCPs office.  He had an outpatient chest x-ray which revealed some concerning changes for possible pneumonia.  Patient denies any fevers, chills, or sweats.  Review of Systems  Positive: Cough, SOB Negative: FCS  Physical Exam  BP 130/71 (BP Location: Left Arm)   Pulse 68   Temp 98.2 F (36.8 C) (Oral)   Resp 18   Ht 5\' 8"  (1.727 m)   Wt 83.2 kg   SpO2 99%   BMI 27.89 kg/m  Gen:   Awake, no distress  NAD Resp:  Normal effort CTA MSK:   Moves extremities without difficulty  Other:    Medical Decision Making  Medically screening exam initiated at 3:12 PM.  Appropriate orders placed.  Talmage Coin Hoelzel was informed that the remainder of the evaluation will be completed by another provider, this initial triage assessment does not replace that evaluation, and the importance of remaining in the ED until their evaluation is complete.  Geriatric patient to the ED for evaluation of productive cough and intermittent shortness of breath.   Melvenia Needles, PA-C 09/18/22 1515

## 2022-09-18 NOTE — Progress Notes (Signed)
[facility] ([facility])  Plan was for patient to be admitted at home with [facility] hospice services. However, patient was transferred to the ED prior to admission.    Patient is not an active patient with [facility] services. [facility] will continue to follow for any discharge planning needs and to coordinate continuation of hospice care.     Please call with any questions/concerns.    Thank you for the opportunity to participate in this patient's care.   Shania Daniel, MSW [facility] Hospital Liaison  [phone]   

## 2022-09-19 ENCOUNTER — Encounter (HOSPITAL_COMMUNITY): Payer: Self-pay

## 2022-09-19 ENCOUNTER — Emergency Department (HOSPITAL_COMMUNITY): Payer: Medicare Other

## 2022-09-19 ENCOUNTER — Emergency Department (HOSPITAL_COMMUNITY)
Admission: EM | Admit: 2022-09-19 | Discharge: 2022-09-19 | Disposition: A | Discharge status: Home / Self Care | Payer: Medicare Other | Attending: Emergency Medicine | Admitting: Emergency Medicine

## 2022-09-19 ENCOUNTER — Other Ambulatory Visit: Payer: Self-pay

## 2022-09-19 ENCOUNTER — Ambulatory Visit: Payer: Medicare Other

## 2022-09-19 DIAGNOSIS — I700 Atherosclerosis of aorta: Secondary | ICD-10-CM | POA: Diagnosis not present

## 2022-09-19 DIAGNOSIS — U071 COVID-19: Secondary | ICD-10-CM | POA: Diagnosis not present

## 2022-09-19 DIAGNOSIS — Z7952 Long term (current) use of systemic steroids: Secondary | ICD-10-CM | POA: Diagnosis not present

## 2022-09-19 DIAGNOSIS — I5043 Acute on chronic combined systolic (congestive) and diastolic (congestive) heart failure: Secondary | ICD-10-CM | POA: Insufficient documentation

## 2022-09-19 DIAGNOSIS — U099 Post covid-19 condition, unspecified: Secondary | ICD-10-CM | POA: Diagnosis not present

## 2022-09-19 DIAGNOSIS — F17210 Nicotine dependence, cigarettes, uncomplicated: Secondary | ICD-10-CM | POA: Diagnosis not present

## 2022-09-19 DIAGNOSIS — I110 Hypertensive heart disease with heart failure: Secondary | ICD-10-CM | POA: Diagnosis not present

## 2022-09-19 DIAGNOSIS — Z79899 Other long term (current) drug therapy: Secondary | ICD-10-CM | POA: Insufficient documentation

## 2022-09-19 DIAGNOSIS — Z7901 Long term (current) use of anticoagulants: Secondary | ICD-10-CM | POA: Diagnosis not present

## 2022-09-19 DIAGNOSIS — R5383 Other fatigue: Secondary | ICD-10-CM | POA: Diagnosis not present

## 2022-09-19 DIAGNOSIS — R0600 Dyspnea, unspecified: Secondary | ICD-10-CM | POA: Diagnosis present

## 2022-09-19 LAB — URINALYSIS, ROUTINE W REFLEX MICROSCOPIC
Bilirubin Urine: NEGATIVE
Glucose, UA: >=500 mg/dL — AB
Hgb urine dipstick: NEGATIVE
Ketones, ur: 5 mg/dL — AB
Leukocytes,Ua: NEGATIVE
Nitrite: NEGATIVE
Protein, ur: 30 mg/dL — AB
Specific Gravity, Urine: 1.016 (ref 1.005–1.030)
pH: 5 (ref 5.0–8.0)

## 2022-09-19 LAB — BRAIN NATRIURETIC PEPTIDE: B Natriuretic Peptide: 267.8 pg/mL — ABNORMAL HIGH (ref 0.0–100.0)

## 2022-09-19 LAB — CBC WITH DIFFERENTIAL/PLATELET
Abs Immature Granulocytes: 0.01 10*3/uL (ref 0.00–0.07)
Basophils Absolute: 0 10*3/uL (ref 0.0–0.1)
Basophils Relative: 1 %
Eosinophils Absolute: 0 10*3/uL (ref 0.0–0.5)
Eosinophils Relative: 1 %
HCT: 32 % — ABNORMAL LOW (ref 39.0–52.0)
Hemoglobin: 10.3 g/dL — ABNORMAL LOW (ref 13.0–17.0)
Immature Granulocytes: 0 %
Lymphocytes Relative: 21 %
Lymphs Abs: 1.8 10*3/uL (ref 0.7–4.0)
MCH: 31.1 pg (ref 26.0–34.0)
MCHC: 32.2 g/dL (ref 30.0–36.0)
MCV: 96.7 fL (ref 80.0–100.0)
Monocytes Absolute: 0.8 10*3/uL (ref 0.1–1.0)
Monocytes Relative: 9 %
Neutro Abs: 5.8 10*3/uL (ref 1.7–7.7)
Neutrophils Relative %: 68 %
Platelets: 183 10*3/uL (ref 150–400)
RBC: 3.31 MIL/uL — ABNORMAL LOW (ref 4.22–5.81)
RDW: 12.1 % (ref 11.5–15.5)
WBC: 8.5 10*3/uL (ref 4.0–10.5)
nRBC: 0 % (ref 0.0–0.2)

## 2022-09-19 LAB — PROTIME-INR
INR: 2.8 — ABNORMAL HIGH (ref 0.8–1.2)
Prothrombin Time: 28.9 seconds — ABNORMAL HIGH (ref 11.4–15.2)

## 2022-09-19 LAB — COMPREHENSIVE METABOLIC PANEL
ALT: 14 U/L (ref 0–44)
AST: 17 U/L (ref 15–41)
Albumin: 3.1 g/dL — ABNORMAL LOW (ref 3.5–5.0)
Alkaline Phosphatase: 40 U/L (ref 38–126)
Anion gap: 6 (ref 5–15)
BUN: 21 mg/dL (ref 8–23)
CO2: 27 mmol/L (ref 22–32)
Calcium: 8.7 mg/dL — ABNORMAL LOW (ref 8.9–10.3)
Chloride: 108 mmol/L (ref 98–111)
Creatinine, Ser: 1.33 mg/dL — ABNORMAL HIGH (ref 0.61–1.24)
GFR, Estimated: 55 mL/min — ABNORMAL LOW (ref >60)
Glucose, Bld: 113 mg/dL — ABNORMAL HIGH (ref 70–99)
Potassium: 4.3 mmol/L (ref 3.5–5.1)
Sodium: 141 mmol/L (ref 135–145)
Total Bilirubin: 0.5 mg/dL (ref 0.3–1.2)
Total Protein: 7 g/dL (ref 6.5–8.1)

## 2022-09-19 LAB — TROPONIN I (HIGH SENSITIVITY)
Troponin I (High Sensitivity): 8 ng/L (ref <18)
Troponin I (High Sensitivity): 9 ng/L (ref <18)

## 2022-09-19 LAB — LACTIC ACID, PLASMA
Lactic Acid, Venous: 1.1 mmol/L (ref 0.5–1.9)
Lactic Acid, Venous: 1.1 mmol/L (ref 0.5–1.9)

## 2022-09-19 MED ORDER — PREDNISONE 20 MG PO TABS: 40.0000 mg | ORAL_TABLET | Freq: Every day | ORAL | 0 refills | Status: AC

## 2022-09-19 MED ORDER — FUROSEMIDE 10 MG/ML IJ SOLN
20.0000 mg | Freq: Once | INTRAMUSCULAR | Status: AC
  Administered 2022-09-19: 20 mg via INTRAVENOUS
  Filled 2022-09-19: qty 4

## 2022-09-19 MED ORDER — FUROSEMIDE 20 MG PO TABS: 20.0000 mg | ORAL_TABLET | Freq: Every day | ORAL | 0 refills | Status: DC

## 2022-09-19 MED ORDER — DEXAMETHASONE SODIUM PHOSPHATE 10 MG/ML IJ SOLN
10.0000 mg | Freq: Once | INTRAMUSCULAR | Status: AC
  Administered 2022-09-19: 10 mg via INTRAVENOUS
  Filled 2022-09-19: qty 1

## 2022-09-19 NOTE — ED Notes (Signed)
Patient transported to X-ray 

## 2022-09-19 NOTE — ED Provider Notes (Signed)
[facility]-EMERGENCY DEPT Provider Note   CSN: [id number] Arrival date & time: 09/19/22  1601     History  Chief Complaint  Patient presents with   Fatigue    Mitchell Barnes is a 76 y.o. male.  HPI Patient with multiple medical issues presents with his wife and son who assists with the history.  Patient's history includes being generally well until May of this year.  During that time the patient had illnesses including stroke, pneumonia, COVID, tick bite.  Per chart review the patient was admitted, completed a course of steroids, antibiotics, doxycycline specifically for his tick bite, was discharged with anticoagulation.  He also has a history notable for heart failure with echocardiogram from 2 months ago with improved function, but previous study with EF 30%.  In essence the patient presents with worsening fatigue, dyspnea, in spite of using his home oxygen.  No new fever, no fall.  No true new foot pain.  He has spoken with his physician, reportedly had medication sent into the pharmacy, he is unsure of what this medication is, some thought of Lasix.  Patient went to our affiliated facility a few days ago, left prior to being seen due to perceived wait time.     Home Medications Prior to Admission medications   Medication Sig Start Date End Date Taking? Authorizing Provider  furosemide (LASIX) 20 MG tablet Take 1 tablet (20 mg total) by mouth daily. 09/19/22  Yes Gerhard Munch, MD  predniSONE (DELTASONE) 20 MG tablet Take 2 tablets (40 mg total) by mouth daily with breakfast for 6 days. For the next four days 09/19/22 09/25/22 Yes Gerhard Munch, MD  acetaminophen (TYLENOL) 650 MG CR tablet Take 650 mg by mouth every 8 (eight) hours as needed for pain or fever.    [provider]  carvedilol (COREG) 3.125 MG tablet Take 1 tablet (3.125 mg total) by mouth 2 (two) times daily with a meal. 05/23/22   Gollan, Tollie Pizza, MD  dapagliflozin  propanediol (FARXIGA) 10 MG TABS tablet Take 1 tablet (10 mg total) by mouth daily before breakfast. 05/23/22   Gollan, Tollie Pizza, MD  diphenhydrAMINE (BENADRYL) 25 MG tablet Take 25 mg by mouth every 6 (six) hours as needed for itching.    [provider]  ferrous sulfate 325 (65 FE) MG tablet Take 325 mg by mouth every Monday, Wednesday, and Friday. Monday, Thursday    [provider]  PHENobarbital (LUMINAL) 32.4 MG tablet Take 64.8 mg by mouth 2 (two) times daily. 04/11/20   [provider]  rosuvastatin (CRESTOR) 20 MG tablet Take 1 tablet (20 mg total) by mouth daily. 04/19/22 04/19/23  Hollice Espy, MD  vitamin B-12 (CYANOCOBALAMIN) 500 MCG tablet Take 500 mcg by mouth daily.    [provider]  Vitamin D, Ergocalciferol, (DRISDOL) 1.25 MG (50000 UNIT) CAPS capsule Take 50,000 Units by mouth every Tuesday. 11/29/21   [provider]  warfarin (COUMADIN) 5 MG tablet TAKE 1-2 TABLETS DAILY OR AS PRESCRIBED BY CLINIC 07/09/22   Antonieta Iba, MD      Allergies    Atorvastatin, Dilantin [phenytoin], and Fluconazole    Review of Systems   Review of Systems  All other systems reviewed and are negative.   Physical Exam Updated Vital Signs BP 132/62   Pulse 67   Temp 98.5 F (36.9 C) (Oral)   Resp 18   SpO2 92%  Physical Exam Vitals and nursing note reviewed.  Constitutional:  General: He is not in acute distress.    Appearance: He is well-developed.     Comments: Chronically ill-appearing adult male awake and alert speaking clearly  HENT:     Head: Normocephalic and atraumatic.  Eyes:     Conjunctiva/sclera: Conjunctivae normal.  Cardiovascular:     Rate and Rhythm: Normal rate and regular rhythm.  Pulmonary:     Effort: Pulmonary effort is normal. No respiratory distress.     Breath sounds: No stridor.  Abdominal:     General: There is no distension.     Tenderness: There is no abdominal tenderness. There is no guarding.   Musculoskeletal:     Right lower leg: Edema present.     Left lower leg: Edema present.  Skin:    General: Skin is warm.     Coloration: Skin is pale.  Neurological:     Mental Status: He is alert and oriented to person, place, and time.  Psychiatric:        Mood and Affect: Mood normal.     ED Results / Procedures / Treatments   Labs (all labs ordered are listed, but only abnormal results are displayed) Labs Reviewed  COMPREHENSIVE METABOLIC PANEL - Abnormal; Notable for the following components:      Result Value   Glucose, Bld 113 (*)    Creatinine, Ser 1.33 (*)    Calcium 8.7 (*)    Albumin 3.1 (*)    GFR, Estimated 55 (*)    All other components within normal limits  CBC WITH DIFFERENTIAL/PLATELET - Abnormal; Notable for the following components:   RBC 3.31 (*)    Hemoglobin 10.3 (*)    HCT 32.0 (*)    All other components within normal limits  BRAIN NATRIURETIC PEPTIDE - Abnormal; Notable for the following components:   B Natriuretic Peptide 267.8 (*)    All other components within normal limits  URINALYSIS, ROUTINE W REFLEX MICROSCOPIC - Abnormal; Notable for the following components:   Glucose, UA >=500 (*)    Ketones, ur 5 (*)    Protein, ur 30 (*)    Bacteria, UA RARE (*)    All other components within normal limits  PROTIME-INR - Abnormal; Notable for the following components:   Prothrombin Time 28.9 (*)    INR 2.8 (*)    All other components within normal limits  LACTIC ACID, PLASMA  LACTIC ACID, PLASMA  TROPONIN I (HIGH SENSITIVITY)  TROPONIN I (HIGH SENSITIVITY)    EKG None  Radiology CT Chest Wo Contrast  Result Date: 09/19/2022 CLINICAL DATA:  Post COVID pulmonary symptoms with cough and weakness. EXAM: CT CHEST WITHOUT CONTRAST TECHNIQUE: Multidetector CT imaging of the chest was performed following the standard protocol without IV contrast. RADIATION DOSE REDUCTION: This exam was performed according to the departmental dose-optimization  program which includes automated exposure control, adjustment of the mA and/or kV according to patient size and/or use of iterative reconstruction technique. COMPARISON:  Chest x-ray 09/19/2022 and prior chest CT 04/27/2022 FINDINGS: Cardiovascular: The heart is within normal limits in size for age. Stable tortuosity, ectasia and calcification of the thoracic aorta but no focal aneurysm. Stable advanced three-vessel coronary artery calcifications. Mediastinum/Nodes: Stable scattered borderline mediastinal and hilar lymph nodes likely due to the underlying lung disease. The esophagus is grossly normal. Lungs/Pleura: Persistent but progressive peripheral interstitial lung changes without significant gradient. Marked subpleural reticulation with thickening of the interlobular septi. Associated areas of patchy ground-glass opacity and tree-in-bud type changes. These findings  can be seen with post COVID respiratory disease. No focal airspace consolidation to suggest typical pneumonia. No pleural effusions. The central tracheobronchial tree is unremarkable. Upper Abdomen: No significant upper abdominal findings. Musculoskeletal: Asymmetric left-sided gynecomastia. This appears stable but recommend correlation with physical examination and mammography and ultrasound if indicated. No significant bony findings. IMPRESSION: 1. Progressive findings in the lungs suggestive of post acute COVID pulmonary disease. 2. No focal airspace consolidation to suggest typical pneumonia. 3. Stable scattered borderline mediastinal and hilar lymph nodes likely due to the underlying lung disease. 4. Stable advanced three-vessel coronary artery calcifications. 5. Asymmetric left-sided gynecomastia. This appears stable but recommend correlation with physical examination and mammography and ultrasound if indicated. 6. Aortic atherosclerosis. Aortic Atherosclerosis (ICD10-I70.0). Electronically Signed   By: Rudie Meyer M.D.   On: 09/19/2022  13:06   DG Chest 2 View  Result Date: 09/19/2022 CLINICAL DATA:  Fatigue. EXAM: CHEST - 2 VIEW COMPARISON:  Yesterday FINDINGS: Worsening generalized interstitial reticulation and ground-glass density. Lung volumes are low. Borderline heart size. No effusion or pneumothorax. IMPRESSION: Worsening generalized pulmonary opacity that could be edema or atypical pneumonia. Electronically Signed   By: Tiburcio Pea M.D.   On: 09/19/2022 10:45   DG Chest 2 View  Result Date: 09/18/2022 CLINICAL DATA:  Shortness of breath. EXAM: CHEST - 2 VIEW COMPARISON:  Chest x-ray September 17, 2022. FINDINGS: Mildly improved patchy peripheral predominant interstitial and airspace opacities. No visible pleural effusions or pneumothorax. Similar mild enlargement the cardiomediastinal silhouette. IMPRESSION: Mildly improved patchy peripheral predominant interstitial and airspace opacities, which could represent infection or edema. Electronically Signed   By: Feliberto Harts M.D.   On: 09/18/2022 15:27   DG Chest 2 View  Result Date: 09/17/2022 CLINICAL DATA:  Cough. EXAM: CHEST - 2 VIEW COMPARISON:  July 03, 2022. FINDINGS: Stable cardiomediastinal silhouette. Increased bilateral perihilar and basilar opacities are noted concerning for worsening edema or possibly infiltrates. Bony thorax is unremarkable. IMPRESSION: Mildly increased bilateral lung opacities are noted concerning for worsening edema or possibly infiltrates. Electronically Signed   By: Lupita Raider M.D.   On: 09/17/2022 14:47    Procedures Procedures    Medications Ordered in ED Medications  dexamethasone (DECADRON) injection 10 mg (has no administration in time range)  furosemide (LASIX) injection 20 mg (has no administration in time range)    ED Course/ Medical Decision Making/ A&P This patient with a Hx of multiple medical issues including stroke, COVID, pneumonia anemia presents to the ED for concern of weakness, dyspnea, edema, this  involves an extensive number of treatment options, and is a complaint that carries with it a high risk of complications and morbidity.    The differential diagnosis includes heart failure exacerbation, anemia, infection, bacteremia, sepsis   Social Determinants of Health:  Advanced age, home oxygen dependency  Additional history obtained:  Additional history and/or information obtained from wife and son at bedside as well as discharge summary from the summer and echocardiogram from 2 months ago, notable for pertinent details including HPI, echocardiogram results included below: IMPRESSIONS     1. Left ventricular ejection fraction, by estimation, is 50 to 55%. The  left ventricle has low normal function.   2. The mitral valve is normal in structure. No evidence of mitral valve  regurgitation.   3. The aortic valve is normal in structure. Aortic valve regurgitation is  not visualized.     After the initial evaluation, orders, including: Labs monitoring supplemental oxygen were initiated.  Patient placed on Cardiac and Pulse-Oximetry Monitors. The patient was maintained on a cardiac monitor.  The cardiac monitored showed an rhythm of 60 sinus normal The patient was also maintained on pulse oximetry. The readings were typically 99% with 3 L nasal cannula abnormal   On repeat evaluation of the patient stayed the same  Lab Tests:  I personally interpreted labs.  The pertinent results include: BNP 267.8.  Substantial glucosuria, patient is therapeutic INR  Imaging Studies ordered:  I independently visualized and interpreted imaging which showed abnormal chest x-ray followed up with chest CT, both concerning for post-COVID inflammatory changes I agree with the radiologist interpretation  2:42 PMDispostion / Final MDM: I discussed all findings at bedside with the patient, his wife, and his son.  Answered all questions.  Specifically discussed CT results, concerning for post COVID  pulmonology changes, as well as elevated BNP concerning for heart failure.  He is presentation for fatigue, dyspnea, likely multifactorial, no evidence for pneumonia, bacteremia, sepsis.  No evidence for coronary ischemia. Patient and family amenable to IV medication initiation here, discharged with oral meds, close outpatient follow-up though hospitalization was a consideration given the baseline poor medical condition. Final Clinical Impression(s) / ED Diagnoses Final diagnoses:  Post-COVID chronic dyspnea  Acute on chronic combined systolic and diastolic congestive heart failure (HCC)    Rx / DC Orders ED Discharge Orders          Ordered    predniSONE (DELTASONE) 20 MG tablet  Daily with breakfast        09/19/22 1442    furosemide (LASIX) 20 MG tablet  Daily        09/19/22 1442              Gerhard Munch, MD 09/19/22 1442

## 2022-09-19 NOTE — ED Triage Notes (Addendum)
Patient has been feeling bad since May after getting Covid. Went to the ED last night but the wait was too long. Patient had Xrays, that showed fluid on his lungs and pneumonia. They called their family doctor who said go back to the ED for intervention. Patient is on 3L [place] oxygen baseline, oxygen saturations at 94%.

## 2022-09-19 NOTE — Discharge Instructions (Addendum)
As discussed, today's evaluation has resulted in diagnosis of post COVID dyspnea and heart failure exacerbation.  Please obtain and take your prescribed medication.  Follow-up with your physician, and to discuss today's evaluation as well as your new prescriptions.  Return here for concerning changes in your condition.  Below is the CT interpretation from today's study.  IMPRESSION: 1. Progressive findings in the lungs suggestive of post acute COVID pulmonary disease. 2. No focal airspace consolidation to suggest typical pneumonia. 3. Stable scattered borderline mediastinal and hilar lymph nodes likely due to the underlying lung disease. 4. Stable advanced three-vessel coronary artery calcifications. 5. Asymmetric left-sided gynecomastia. This appears stable but recommend correlation with physical examination and mammography and ultrasound if indicated. 6. Aortic atherosclerosis.

## 2022-09-24 DIAGNOSIS — R569 Unspecified convulsions: Secondary | ICD-10-CM | POA: Diagnosis not present

## 2022-09-24 DIAGNOSIS — I700 Atherosclerosis of aorta: Secondary | ICD-10-CM | POA: Diagnosis not present

## 2022-09-24 DIAGNOSIS — D6859 Other primary thrombophilia: Secondary | ICD-10-CM | POA: Diagnosis not present

## 2022-09-24 DIAGNOSIS — Z0000 Encounter for general adult medical examination without abnormal findings: Secondary | ICD-10-CM | POA: Diagnosis not present

## 2022-09-24 DIAGNOSIS — E7800 Pure hypercholesterolemia, unspecified: Secondary | ICD-10-CM | POA: Diagnosis not present

## 2022-09-24 DIAGNOSIS — Z8616 Personal history of COVID-19: Secondary | ICD-10-CM | POA: Diagnosis not present

## 2022-09-24 DIAGNOSIS — M47812 Spondylosis without myelopathy or radiculopathy, cervical region: Secondary | ICD-10-CM | POA: Diagnosis not present

## 2022-09-24 DIAGNOSIS — Z8709 Personal history of other diseases of the respiratory system: Secondary | ICD-10-CM | POA: Diagnosis not present

## 2022-09-24 DIAGNOSIS — Z79899 Other long term (current) drug therapy: Secondary | ICD-10-CM | POA: Diagnosis not present

## 2022-09-24 DIAGNOSIS — Z23 Encounter for immunization: Secondary | ICD-10-CM | POA: Diagnosis not present

## 2022-09-24 DIAGNOSIS — E559 Vitamin D deficiency, unspecified: Secondary | ICD-10-CM | POA: Diagnosis not present

## 2022-09-24 DIAGNOSIS — Z8673 Personal history of transient ischemic attack (TIA), and cerebral infarction without residual deficits: Secondary | ICD-10-CM | POA: Diagnosis not present

## 2022-09-24 DIAGNOSIS — M810 Age-related osteoporosis without current pathological fracture: Secondary | ICD-10-CM | POA: Diagnosis not present

## 2022-09-26 ENCOUNTER — Ambulatory Visit: Payer: Medicare Other | Attending: Cardiovascular Disease

## 2022-09-26 DIAGNOSIS — Z5181 Encounter for therapeutic drug level monitoring: Secondary | ICD-10-CM | POA: Diagnosis not present

## 2022-09-26 DIAGNOSIS — I639 Cerebral infarction, unspecified: Secondary | ICD-10-CM

## 2022-09-26 LAB — POCT INR: INR: 3.1 — AB (ref 2.0–3.0)

## 2022-09-26 NOTE — Patient Instructions (Signed)
Continue on same dosage 1 tablet daily except 1.5 tablets on Mondays, Wednesdays and Fridays.  Recheck INR in 5 weeks.  Eat greens tonight.

## 2022-10-05 ENCOUNTER — Other Ambulatory Visit: Payer: Self-pay | Admitting: Cardiovascular Disease

## 2022-10-05 DIAGNOSIS — I4891 Unspecified atrial fibrillation: Secondary | ICD-10-CM

## 2022-10-05 NOTE — Telephone Encounter (Signed)
Refill request

## 2022-10-24 DIAGNOSIS — D6859 Other primary thrombophilia: Secondary | ICD-10-CM | POA: Diagnosis not present

## 2022-10-24 DIAGNOSIS — I700 Atherosclerosis of aorta: Secondary | ICD-10-CM | POA: Diagnosis not present

## 2022-10-24 DIAGNOSIS — R059 Cough, unspecified: Secondary | ICD-10-CM | POA: Diagnosis not present

## 2022-10-24 DIAGNOSIS — I5042 Chronic combined systolic (congestive) and diastolic (congestive) heart failure: Secondary | ICD-10-CM | POA: Diagnosis not present

## 2022-10-31 ENCOUNTER — Ambulatory Visit: Payer: Medicare Other | Attending: Cardiovascular Disease

## 2022-10-31 DIAGNOSIS — I639 Cerebral infarction, unspecified: Secondary | ICD-10-CM | POA: Diagnosis not present

## 2022-10-31 DIAGNOSIS — Z7901 Long term (current) use of anticoagulants: Secondary | ICD-10-CM

## 2022-10-31 LAB — POCT INR: INR: 1.8 — AB (ref 2.0–3.0)

## 2022-10-31 NOTE — Patient Instructions (Signed)
TAKE 2 TABLETS TODAY ONLY THEN  Continue on same dosage 1 tablet daily except 1.5 tablets on Mondays, Wednesdays and Fridays.  Recheck INR in 5 weeks.

## 2022-11-22 NOTE — Progress Notes (Signed)
Cardiology Office Note  Date:  11/23/2022   ID:  [name], DOB 08/29/1946, MRN [id number]  PCP:  Lorenda Ishihara, MD   Chief Complaint  Patient presents with   3 month follow up     Patient c/o shortness of breath with some LE edema. Medications reviewed by the patient verbally.     HPI:  Mitchell Barnes is a 76 y.o. male with a hx of seizures,  Anemia, HLD,  HTN,   Nonsmoker, no diabetes HAB1C 5.8 Aortic atherosclerosis with aneurysmal dilatation of the aortic root measuring 4.4 cm chest pain, prior hospitalization August 2021, negative stress test Cardiomyopathy 04/2022 EF 35 to 40% after stroke Limited echo August 2023 EF 50 to 55% Who presents for follow-up of his chest pain  Last seen by myself in clinic June 2023 Currently followed by hospice past 2 months  Seen in the emergency room September 19, 2022 for fatigue Hemoglobin 10 CT scan chest: Showing postacute COVID pulmonary disease Remains on oxygen 2 to 3 L, mild desaturation on ambulation in the house  Previously seen in clinic several months ago, started on [employer] BUN/creatinine and lab work follow-up, recommendation made to hold Entresto Renal function with mild improvement since that time Not on lasix, denies leg swelling, PND orthopnea  BP running low, occasional orthostasis symptoms Tolerating carvedilol, Farxiga Remains on warfarin  EKG personally reviewed by myself on todays visit Normal sinus rhythm rate 62 bpm left bundle branch block left anterior fascicular block  Other past medical history reviewed hospital admissions in Apr 18 2021 (cva) and May 22-26 (covid) hospital admission with COVID-19 infection hypoxic respiratory failure Apr 27, 2022 stroke on Eliquis and aspirin  Echocardiogram Apr 19, 2022 after CVA ejection fraction 35 to 40%, severe global hypokinesis, unable to exclude stress cardiomyopathy Unable to exclude mural thrombus, Was started on  anticoagulation  Prior ejection fraction in May 2021 was 55 to 60%  Echo 04/16/20 LVEF 55-60%, mid LVH, gr1DD, RV normal size and function, mild dilation aortic root 83mm.  Lexiscan myoview 04/16/20 with no significant ischemia, no EKG changes, CT attenuation correction images with coronary calcification and minimal aortic atherosclerosis.     lexiscan Myoview 04/16/20 Study Result   Pharmacological myocardial perfusion imaging study with no significant  ischemia Normal wall motion, EF estimated at 81% No EKG changes concerning for ischemia at peak stress or in recovery. CT attenuation correction images with coronary calcification noted, minimal aortic atherosclerosis Low risk scan      Echo 04/16/20  1. Left ventricular ejection fraction, by estimation, is 55 to 60%. The  left ventricle has normal function. The left ventricle has no regional  wall motion abnormalities. There is mild left ventricular hypertrophy.  Left ventricular diastolic parameters  are consistent with Grade I diastolic dysfunction (impaired relaxation).   2. Right ventricular systolic function is normal. The right ventricular  size is normal. Tricuspid regurgitation signal is inadequate for assessing  PA pressure.   3. There is mild dilatation of the aortic root measuring 37 mm.    PMH:   has a past medical history of Anemia, Aortic atherosclerosis (HCC) (04/28/2022), HLD (hyperlipidemia), and Seizures (HCC).  PSH:    Past Surgical History:  Procedure Laterality Date   EYE SURGERY      Current Outpatient Medications  Medication Sig Dispense Refill   acetaminophen (TYLENOL) 650 MG CR tablet Take 650 mg by mouth every 8 (eight) hours as needed for pain or fever.  carvedilol (COREG) 3.125 MG tablet Take 1 tablet (3.125 mg total) by mouth 2 (two) times daily with a meal. 180 tablet 3   dapagliflozin propanediol (FARXIGA) 10 MG TABS tablet Take 1 tablet (10 mg total) by mouth daily before breakfast. 90 tablet 3    diphenhydrAMINE (BENADRYL) 25 MG tablet Take 25 mg by mouth every 6 (six) hours as needed for itching.     ferrous sulfate 325 (65 FE) MG tablet Take 325 mg by mouth every Monday, Wednesday, and Friday. Monday, Thursday     furosemide (LASIX) 20 MG tablet Take 20 mg by mouth as needed.     PHENobarbital (LUMINAL) 32.4 MG tablet Take 64.8 mg by mouth 2 (two) times daily.     Potassium Chloride ER 20 MEQ TBCR Take 1 tablet by mouth daily as needed.     rosuvastatin (CRESTOR) 20 MG tablet Take 1 tablet (20 mg total) by mouth daily. 30 tablet 11   vitamin B-12 (CYANOCOBALAMIN) 500 MCG tablet Take 500 mcg by mouth daily.     Vitamin D, Ergocalciferol, (DRISDOL) 1.25 MG (50000 UNIT) CAPS capsule Take 50,000 Units by mouth every Tuesday.     warfarin (COUMADIN) 5 MG tablet TAKE 1-2 TABLETS DAILY OR AS PRESCRIBED BY CLINIC 120 tablet 0   No current facility-administered medications for this visit.     Allergies:   Atorvastatin, Dilantin [phenytoin], and Fluconazole   Social History:  The patient  reports that he has never smoked. He has never used smokeless tobacco. He reports current alcohol use. He reports that he does not use drugs.   Family History:   family history includes Heart attack in his father.   Review of Systems: Review of Systems  Constitutional: Negative.   HENT: Negative.    Respiratory: Negative.    Cardiovascular: Negative.   Gastrointestinal: Negative.   Musculoskeletal: Negative.   Neurological: Negative.   Psychiatric/Behavioral: Negative.    All other systems reviewed and are negative.   PHYSICAL EXAM: VS:  BP 100/70 (BP Location: Left Arm, Patient Position: Sitting, Cuff Size: Normal)   Ht 5\' 8"  (1.727 m)   Wt 179 lb 4 oz (81.3 kg)   SpO2 95% Comment: 3 Liters of oxygen  BMI 27.25 kg/m  , BMI Body mass index is 27.25 kg/m. Constitutional:  oriented to person, place, and time. No distress.  HENT:  Head: Grossly normal Eyes:  no discharge. No scleral  icterus.  Neck: No JVD, no carotid bruits  Cardiovascular: Regular rate and rhythm, no murmurs appreciated Pulmonary/Chest: Clear to auscultation bilaterally, Rales at the bases bilaterally Abdominal: Soft.  no distension.  no tenderness.  Musculoskeletal: Normal range of motion Neurological:  normal muscle tone. Coordination normal. No atrophy Skin: Skin warm and dry Psychiatric: normal affect, pleasant  Recent Labs: 09/18/2022: Magnesium 2.6 09/19/2022: ALT 14; B Natriuretic Peptide 267.8; BUN 21; Creatinine, Ser 1.33; Hemoglobin 10.3; Platelets 183; Potassium 4.3; Sodium 141    Lipid Panel Lab Results  Component Value Date   CHOL 174 04/19/2022   HDL 71 04/19/2022   LDLCALC 91 04/19/2022   TRIG 61 04/19/2022      Wt Readings from Last 3 Encounters:  11/23/22 179 lb 4 oz (81.3 kg)  09/18/22 183 lb 6.8 oz (83.2 kg)  08/23/22 183 lb 6.4 oz (83.2 kg)     ASSESSMENT AND PLAN:  Problem List Items Addressed This Visit       Cardiology Problems   Chronic combined systolic and diastolic CHF (congestive heart  failure) (HCC) - Primary (Chronic)   Relevant Medications   furosemide (LASIX) 20 MG tablet   Atrial fibrillation (HCC)   Relevant Medications   furosemide (LASIX) 20 MG tablet   CVA (cerebral vascular accident) (HCC)   Relevant Medications   furosemide (LASIX) 20 MG tablet   Other Visit Diagnoses     Dyspnea on exertion       Dilated cardiomyopathy (HCC)       Relevant Medications   furosemide (LASIX) 20 MG tablet     Stroke  recovered with improved peripheral vision Previously with low ejection fraction, unable to exclude mural thrombus versus other etiology of his stroke,  treated with warfarin Now with normalized ejection fraction Discussed with him today, decision made to continue warfarin for now to minimize risk of recurrent stroke.  Unable to exclude cardiac arrhythmia  Hyperlipidemia Continue Crestor  Dilated aortic root 4.4 cm on imaging Not a  surgical candidate  Stress cardiomyopathy Continue Farxiga 10 mg daily, Coreg 3.125 twice daily Ejection fraction improved on echo August 2023,  ef 50 to 55% No medication changes made, limitations secondary to hypotension Having rare orthostasis symptoms  Acute on chronic respiratory failure Continued respiratory distress, Rales at the bases, CT scan with post-COVID syndrome   Total encounter time more than 30 minutes  Greater than 50% was spent in counseling and coordination of care with the patient   Signed, Dossie Arbour, M.D., Ph.D. [facility] [place], [place] [phone]

## 2022-11-23 ENCOUNTER — Ambulatory Visit: Payer: Medicare Other | Attending: Cardiovascular Disease | Admitting: Cardiovascular Disease

## 2022-11-23 ENCOUNTER — Encounter: Payer: Self-pay | Admitting: Cardiovascular Disease

## 2022-11-23 VITALS — BP 100/70 | HR 62 | Ht 68.0 in | Wt 179.2 lb

## 2022-11-23 DIAGNOSIS — I4891 Unspecified atrial fibrillation: Secondary | ICD-10-CM

## 2022-11-23 DIAGNOSIS — I420 Dilated cardiomyopathy: Secondary | ICD-10-CM

## 2022-11-23 DIAGNOSIS — I5042 Chronic combined systolic (congestive) and diastolic (congestive) heart failure: Secondary | ICD-10-CM | POA: Diagnosis not present

## 2022-11-23 DIAGNOSIS — I639 Cerebral infarction, unspecified: Secondary | ICD-10-CM

## 2022-11-23 DIAGNOSIS — R0609 Other forms of dyspnea: Secondary | ICD-10-CM

## 2022-11-23 MED ORDER — CARVEDILOL 3.125 MG PO TABS: 3.1250 mg | ORAL_TABLET | Freq: Two times a day (BID) | ORAL | 3 refills | Status: DC

## 2022-11-23 MED ORDER — DAPAGLIFLOZIN PROPANEDIOL 10 MG PO TABS: 10.0000 mg | ORAL_TABLET | Freq: Every day | ORAL | 3 refills | Status: DC

## 2022-11-23 NOTE — Patient Instructions (Signed)
Medication Instructions:  No changes  If you need a refill on your cardiac medications before your next appointment, please call your pharmacy.    Lab work: No new labs needed   Testing/Procedures: No new testing needed   Follow-Up: At [facility], you and your health needs are our priority.  As part of our continuing mission to provide you with exceptional heart care, we have created designated Provider Care Teams.  These Care Teams include your primary Cardiologist (physician) and Advanced Practice Providers (APPs -  Physician Assistants and Nurse Practitioners) who all work together to provide you with the care you need, when you need it.  You will need a follow up appointment in 6 months  Providers on your designated Care Team:   Christopher Berge, NP Ryan Dunn, PA-C Cadence Furth, PA-C  COVID-19 Vaccine Information can be found at: [email or web address] For questions related to vaccine distribution or appointments, please email [email or web address] or call [phone].   

## 2022-11-26 ENCOUNTER — Other Ambulatory Visit: Payer: Self-pay | Admitting: Cardiovascular Disease

## 2022-11-26 DIAGNOSIS — I4891 Unspecified atrial fibrillation: Secondary | ICD-10-CM

## 2022-11-27 NOTE — Telephone Encounter (Signed)
Refill request

## 2022-12-05 ENCOUNTER — Ambulatory Visit: Payer: Medicare Other | Attending: Cardiovascular Disease

## 2022-12-05 DIAGNOSIS — Z5181 Encounter for therapeutic drug level monitoring: Secondary | ICD-10-CM | POA: Diagnosis not present

## 2022-12-05 DIAGNOSIS — I639 Cerebral infarction, unspecified: Secondary | ICD-10-CM | POA: Diagnosis not present

## 2022-12-05 DIAGNOSIS — Z7901 Long term (current) use of anticoagulants: Secondary | ICD-10-CM | POA: Diagnosis not present

## 2022-12-05 LAB — POCT INR: INR: 1.4 — AB (ref 2.0–3.0)

## 2022-12-05 NOTE — Patient Instructions (Signed)
TAKE 2.5 TABLETS TODAY ONLY THEN  Continue on same dosage 1 tablet daily except 1.5 tablets on Mondays, Wednesdays and Fridays.  Recheck INR in 3 weeks.

## 2022-12-26 ENCOUNTER — Ambulatory Visit: Payer: Medicare Other | Attending: Cardiovascular Disease

## 2022-12-26 DIAGNOSIS — I639 Cerebral infarction, unspecified: Secondary | ICD-10-CM | POA: Diagnosis not present

## 2022-12-26 DIAGNOSIS — Z7901 Long term (current) use of anticoagulants: Secondary | ICD-10-CM

## 2022-12-26 DIAGNOSIS — Z5181 Encounter for therapeutic drug level monitoring: Secondary | ICD-10-CM

## 2022-12-26 LAB — POCT INR: INR: 3.5 — AB (ref 2.0–3.0)

## 2022-12-26 NOTE — Patient Instructions (Signed)
TAKE 1 TABLET TODAY ONLY THEN Continue on same dosage 1 tablet daily except 1.5 tablets on Mondays, Wednesdays and Fridays.  Recheck INR in 3 weeks.

## 2023-01-01 ENCOUNTER — Ambulatory Visit: Payer: Medicare Other | Attending: Medical | Admitting: Medical

## 2023-01-01 ENCOUNTER — Ambulatory Visit (INDEPENDENT_AMBULATORY_CARE_PROVIDER_SITE_OTHER): Payer: Medicare Other

## 2023-01-01 ENCOUNTER — Encounter: Payer: Self-pay | Admitting: Medical

## 2023-01-01 ENCOUNTER — Other Ambulatory Visit
Admission: RE | Admit: 2023-01-01 | Discharge: 2023-01-01 | Disposition: A | Discharge status: Home / Self Care | Source: Ambulatory Visit | Attending: Medical | Admitting: Medical

## 2023-01-01 VITALS — BP 102/72 | HR 59 | Ht 68.0 in | Wt 179.6 lb

## 2023-01-01 DIAGNOSIS — I77810 Thoracic aortic ectasia: Secondary | ICD-10-CM

## 2023-01-01 DIAGNOSIS — I5042 Chronic combined systolic (congestive) and diastolic (congestive) heart failure: Secondary | ICD-10-CM | POA: Diagnosis not present

## 2023-01-01 DIAGNOSIS — R001 Bradycardia, unspecified: Secondary | ICD-10-CM

## 2023-01-01 DIAGNOSIS — I5022 Chronic systolic (congestive) heart failure: Secondary | ICD-10-CM | POA: Insufficient documentation

## 2023-01-01 DIAGNOSIS — J9611 Chronic respiratory failure with hypoxia: Secondary | ICD-10-CM

## 2023-01-01 DIAGNOSIS — I63422 Cerebral infarction due to embolism of left anterior cerebral artery: Secondary | ICD-10-CM | POA: Diagnosis not present

## 2023-01-01 DIAGNOSIS — E782 Mixed hyperlipidemia: Secondary | ICD-10-CM

## 2023-01-01 DIAGNOSIS — I513 Intracardiac thrombosis, not elsewhere classified: Secondary | ICD-10-CM | POA: Diagnosis not present

## 2023-01-01 LAB — BASIC METABOLIC PANEL
Anion gap: 6 (ref 5–15)
BUN: 30 mg/dL — ABNORMAL HIGH (ref 8–23)
CO2: 27 mmol/L (ref 22–32)
Calcium: 8.7 mg/dL — ABNORMAL LOW (ref 8.9–10.3)
Chloride: 107 mmol/L (ref 98–111)
Creatinine, Ser: 1.51 mg/dL — ABNORMAL HIGH (ref 0.61–1.24)
GFR, Estimated: 48 mL/min — ABNORMAL LOW (ref >60)
Glucose, Bld: 110 mg/dL — ABNORMAL HIGH (ref 70–99)
Potassium: 5 mmol/L (ref 3.5–5.1)
Sodium: 140 mmol/L (ref 135–145)

## 2023-01-01 LAB — MAGNESIUM: Magnesium: 2.7 mg/dL — ABNORMAL HIGH (ref 1.7–2.4)

## 2023-01-01 LAB — TSH: TSH: 1.182 u[IU]/mL (ref 0.350–4.500)

## 2023-01-01 NOTE — Patient Instructions (Signed)
Medication Instructions:  Your physician has recommended you make the following change in your medication:   Stop carvedilol (COREG) 3.125 MG tablet  *If you need a refill on your cardiac medications before your next appointment, please call your pharmacy*   Lab Work: Your physician recommends that you get lab work: TSH, Magnesium & [place] Entrance at [facility] 1st desk on the right to check in (REGISTRATION)  Lab hours: Monday- Friday (7:30 am- 5:30 pm)   If you have labs (blood work) drawn today and your tests are completely normal, you will receive your results only by: MyChart Message (if you have MyChart) OR A paper copy in the mail If you have any lab test that is abnormal or we need to change your treatment, we will call you to review the results.   Testing/Procedures: Heart Monitor:  Length of Wear: 14 days  Your monitor will be mailed to your home address within 3-5 business days. However, if you have not received your monitor after 5 business days please send [place] a MyChart message or call the office at [phone], so we may follow up on this for you.   Your physician has recommended that you wear a Zio live AT monitor.   This monitor is a medical device that records the heart's electrical activity. Doctors most often use these monitors to diagnose arrhythmias. Arrhythmias are problems with the speed or rhythm of the heartbeat. The monitor is a small device applied to your chest. You can wear one while you do your normal daily activities. While wearing this monitor if you have any symptoms to push the button and record what you felt. Once you have worn this monitor for the period of time provider prescribed (Usually 14 days), you will return the monitor device in the postage paid box. Once it is returned they will download the data collected and provide [place] with a report which the provider will then review and we will call you with those results. Important  tips:  Avoid showering during the first 24 hours of wearing the monitor. Avoid excessive sweating to help maximize wear time. Do not submerge the device, no hot tubs, and no swimming pools. Keep any lotions or oils away from the patch. After 24 hours you may shower with the patch on. Take brief showers with your back facing the shower head.  Do not remove patch once it has been placed because that will interrupt data and decrease adhesive wear time. Push the button when you have any symptoms and write down what you were feeling. Once you have completed wearing your monitor, remove and place into box which has postage paid and place in your outgoing mailbox.  If for some reason you have misplaced your box then call our office and we can provide another box and/or mail it off for you.       Follow-Up: At [facility], you and your health needs are our priority.  As part of our continuing mission to provide you with exceptional heart care, we have created designated Provider Care Teams.  These Care Teams include your primary Cardiologist (physician) and Advanced Practice Providers (APPs -  Physician Assistants and Nurse Practitioners) who all work together to provide you with the care you need, when you need it.  We recommend signing up for the patient portal called "MyChart".  Sign up information is provided on this After Visit Summary.  MyChart is used to connect with patients for Virtual  Visits (Telemedicine).  Patients are able to view lab/test results, encounter notes, upcoming appointments, etc.  Non-urgent messages can be sent to your provider as well.   To learn more about what you can do with MyChart, go to [email or web address].    Your next appointment:   6 - 8  week(s)  Provider:   You may see Ida Rogue, MD or one of the following Advanced Practice Providers on your designated Care Team:   Cadence Kathlen Mody, [place]  Other Instructions -None

## 2023-01-01 NOTE — Progress Notes (Unsigned)
Cardiology Office Note:    Date:  01/02/2023   ID:  Mitchell Barnes, DOB 07/21/1946, MRN [id number]  PCP:  Leeroy Cha, MD  [facility] Cardiologist:  Ida Rogue, MD  [place] Electrophysiologist:  None   Referring MD: Leeroy Cha,*   Chief Complaint: Bradycardia  History of Present Illness:    Mitchell Barnes is a 77 y.o. male with a hx of seizures, anemia, hyperlipidemia, stress cardiomyopathy with improvement of LVEF, hypertension, aortic atherosclerosis with aneurysmal dilation of the aortic root measuring 4.4 cm who presents for bradycardia.  Patient was hospitalized in August 2021 for chest pain.  He had a negative stress test at that time.  CT attenuation images showed coronary calcification and minimal aortic atherosclerosis.  Echo in May 2021 showed LVEF 55 to 60%.  Patient was admitted in May 2023 for CVA, COVID-19, acute respiratory failure, newly reduced EF.  Echo showed EF 35 to 40%, severe global hypokinesis, unable to exclude stress cardiomyopathy.  Unable to exclude mural thrombus, this the patient was started on anticoagulation.  Patient was started on guideline directed medical therapy for CHF.  Follow-up echo in August 2023 showed improved EF 50 to 55%.  Patient was last seen December 2023 was still on warfarin, with the decision to continue warfarin to minimize risk of recurrent stroke.  Today, the patient reports HR in the 30s during the day. HR goes up when he moves (up into the 50s). He started noticing low heart rates a week ago. He occasionally feels dizzy and lightheaded. He is using 3L O2, which is baseline. NO chest pain. He takes lasix as needed.   Past Medical History:  Diagnosis Date   Anemia    Aortic atherosclerosis ([place]) 04/28/2022   HLD (hyperlipidemia)    Seizures (HCC)     Past Surgical History:  Procedure Laterality Date   EYE SURGERY      Current Medications: Current Meds  Medication Sig    dapagliflozin propanediol (FARXIGA) 10 MG TABS tablet Take 1 tablet (10 mg total) by mouth daily before breakfast.   furosemide (LASIX) 20 MG tablet Take 20 mg by mouth as needed.   PHENobarbital (LUMINAL) 32.4 MG tablet Take 64.8 mg by mouth 2 (two) times daily.   Potassium Chloride ER 20 MEQ TBCR Take 1 tablet by mouth daily as needed.   rosuvastatin (CRESTOR) 20 MG tablet Take 1 tablet (20 mg total) by mouth daily.   vitamin B-12 (CYANOCOBALAMIN) 500 MCG tablet Take 500 mcg by mouth daily.   Vitamin D, Ergocalciferol, (DRISDOL) 1.25 MG (50000 UNIT) CAPS capsule Take 50,000 Units by mouth every Tuesday.   warfarin (COUMADIN) 5 MG tablet TAKE 1-2 TABLETS DAILY OR AS PRESCRIBED BY CLINIC   [DISCONTINUED] carvedilol (COREG) 3.125 MG tablet Take 1 tablet (3.125 mg total) by mouth 2 (two) times daily with a meal.     Allergies:   Atorvastatin, Dilantin [phenytoin], and Fluconazole   Social History   Socioeconomic History   Marital status: Married    Spouse name: Not on file   Number of children: Not on file   Years of education: Not on file   Highest education level: Not on file  Occupational History   Not on file  Tobacco Use   Smoking status: Never   Smokeless tobacco: Never  Vaping Use   Vaping Use: Never used  Substance and Sexual Activity   Alcohol use: Not Currently   Drug use: No   Sexual activity: Yes  Other  Topics Concern   Not on file  Social History Narrative   Not on file   Social Determinants of Health   Financial Resource Strain: Not on file  Food Insecurity: Not on file  Transportation Needs: Not on file  Physical Activity: Not on file  Stress: Not on file  Social Connections: Not on file     Family History: The patient's family history includes Heart attack in his father.  ROS:   Please see the history of present illness.     All other systems reviewed and are negative.  EKGs/Labs/Other Studies Reviewed:    The following studies were reviewed  today:  Echo 07/2022  1. Left ventricular ejection fraction, by estimation, is 50 to 55%. The  left ventricle has low normal function.   2. The mitral valve is normal in structure. No evidence of mitral valve  regurgitation.   3. The aortic valve is normal in structure. Aortic valve regurgitation is  not visualized.   Echo 04/2022 1. Left ventricular ejection fraction, by estimation, is 35 to 40%. The  left ventricle has moderately decreased function. The left ventricle  demonstrates severe global hypokinesis, basal regions best preserved  (possibly consistent with stress  cardiomyopathy). The left ventricular internal cavity size was mildly  dilated. Left ventricular diastolic parameters are consistent with Grade I  diastolic dysfunction (impaired relaxation).   2. Unable to exclude mural thrombus   3. Right ventricular systolic function is normal. The right ventricular  size is normal.   4. The mitral valve is normal in structure. No evidence of mitral valve  regurgitation. No evidence of mitral stenosis.   5. The aortic valve is normal in structure. Aortic valve regurgitation is  not visualized. Aortic valve sclerosis is present, with no evidence of  aortic valve stenosis.   6. There is mild dilatation of the aortic root, measuring 40 mm.   7. The inferior vena cava is normal in size with greater than 50%  respiratory variability, suggesting right atrial pressure of 3 mmHg.   Myoview lexiscan 2023 Narrative & Impression  Pharmacological myocardial perfusion imaging study with no significant  ischemia Normal wall motion, EF estimated at 81% No EKG changes concerning for ischemia at peak stress or in recovery. CT attenuation correction images with coronary calcification noted, minimal aortic atherosclerosis Low risk scan     Signed, Esmond Plants, MD, Ph.D [facility]    EKG:  EKG is ordered today.  The ekg ordered today demonstrates SB 59bpm, RBBB, LAFB, no  changes  Recent Labs: 09/19/2022: ALT 14; B Natriuretic Peptide 267.8; Hemoglobin 10.3; Platelets 183 01/01/2023: BUN 30; Creatinine, Ser 1.51; Magnesium 2.7; Potassium 5.0; Sodium 140; TSH 1.182  Recent Lipid Panel    Component Value Date/Time   CHOL 174 04/19/2022 0423   TRIG 61 04/19/2022 0423   HDL 71 04/19/2022 0423   CHOLHDL 2.5 04/19/2022 0423   VLDL 12 04/19/2022 0423   LDLCALC 91 04/19/2022 0423     Physical Exam:    VS:  BP 102/72 (BP Location: Left Arm, Patient Position: Sitting, Cuff Size: Normal)   Pulse (!) 59   Ht 5\' 8"  (1.727 m)   Wt 179 lb 9.6 oz (81.5 kg)   SpO2 92% Comment: 3 L  BMI 27.31 kg/m     Wt Readings from Last 3 Encounters:  01/01/23 179 lb 9.6 oz (81.5 kg)  11/23/22 179 lb 4 oz (81.3 kg)  09/18/22 183 lb 6.8 oz (83.2 kg)  GEN:  Well nourished, well developed in no acute distress HEENT: Normal NECK: No JVD; No carotid bruits LYMPHATICS: No lymphadenopathy CARDIAC: bradycardia, RR, no murmurs, rubs, gallops RESPIRATORY:  Clear to auscultation without rales, wheezing or rhonchi  ABDOMEN: Soft, non-tender, non-distended MUSCULOSKELETAL:  No edema; No deformity  SKIN: Warm and dry NEUROLOGIC:  Alert and oriented x 3 PSYCHIATRIC:  Normal affect   ASSESSMENT:    1. Bradycardia   2. Chronic combined systolic and diastolic CHF (congestive heart failure) ([place])   3. Chronic systolic heart failure ([place])   4. Chronic respiratory failure with hypoxia (HCC)   5. Mural thrombus of heart   6. Aortic root dilation (HCC)   7. Hyperlipidemia, mixed   8. Cerebrovascular accident (CVA) due to embolism of left anterior cerebral artery (HCC)    PLAN:    In order of problems listed above:  Bradycardia Patient reports heart rats down into the 30s during the day. He denies significant symptoms. Today EKG shows SB with a heart rate of 59bpm. He is on Coreg 3.125mg BID. I will stop Coreg. I will get labs TSH, Mag, and BMET. I will order a live heart  monitor.  HFmrEF H/o stress cardiomyopathy with improvement of EF. Most recent echo 07/2022 showed improved LVEF 50-55%. The patient is euvolemic on exam. He takes lasix and potassium as needed for lower leg swelling.   DOE Chronic respiratory failure on 3LO2 Patient is on baseline 3L O2.   Mural thrombus Continue coumadin.   Dilated aortic root 4.4 on most recent imaging. Plan for annual imaging.  HLD LDL 91 04/2022. Continue Crestor 20mg  daily. Lipid panel can be updated at follow-up.   CVA 04/2022 With no residual defects. Continue Crestor and warfarin.   Disposition: Follow up in 6-8 week(s) with MD/APP   Signed, Elishua Radford Ninfa Meeker, PA-C  01/02/2023 10:50 AM    [place]

## 2023-01-04 DIAGNOSIS — R001 Bradycardia, unspecified: Secondary | ICD-10-CM | POA: Diagnosis not present

## 2023-01-05 DIAGNOSIS — R001 Bradycardia, unspecified: Secondary | ICD-10-CM | POA: Diagnosis not present

## 2023-01-16 ENCOUNTER — Ambulatory Visit: Payer: Medicare Other | Attending: Cardiovascular Disease | Admitting: Pharmacist

## 2023-01-16 DIAGNOSIS — I4891 Unspecified atrial fibrillation: Secondary | ICD-10-CM

## 2023-01-16 DIAGNOSIS — I639 Cerebral infarction, unspecified: Secondary | ICD-10-CM | POA: Diagnosis not present

## 2023-01-16 DIAGNOSIS — Z7901 Long term (current) use of anticoagulants: Secondary | ICD-10-CM | POA: Diagnosis not present

## 2023-01-16 LAB — POCT INR: INR: 1.3 — AB (ref 2.0–3.0)

## 2023-01-16 NOTE — Patient Instructions (Signed)
Description    TAKE 2 tablets today and tomorrow (96m each day) and then continue on same dosage 1 tablet daily except 1.5 tablets on Mondays, Wednesdays and Fridays.  Recheck INR in 2 weeks.

## 2023-01-25 DIAGNOSIS — R569 Unspecified convulsions: Secondary | ICD-10-CM | POA: Diagnosis not present

## 2023-01-25 DIAGNOSIS — R54 Age-related physical debility: Secondary | ICD-10-CM | POA: Diagnosis not present

## 2023-01-25 DIAGNOSIS — D6859 Other primary thrombophilia: Secondary | ICD-10-CM | POA: Diagnosis not present

## 2023-01-25 DIAGNOSIS — I509 Heart failure, unspecified: Secondary | ICD-10-CM | POA: Diagnosis not present

## 2023-01-25 DIAGNOSIS — I700 Atherosclerosis of aorta: Secondary | ICD-10-CM | POA: Diagnosis not present

## 2023-01-25 DIAGNOSIS — E261 Secondary hyperaldosteronism: Secondary | ICD-10-CM | POA: Diagnosis not present

## 2023-01-25 DIAGNOSIS — N1831 Chronic kidney disease, stage 3a: Secondary | ICD-10-CM | POA: Diagnosis not present

## 2023-01-30 ENCOUNTER — Ambulatory Visit: Payer: Medicare Other | Attending: Cardiovascular Disease

## 2023-01-30 DIAGNOSIS — Z7901 Long term (current) use of anticoagulants: Secondary | ICD-10-CM

## 2023-01-30 DIAGNOSIS — I639 Cerebral infarction, unspecified: Secondary | ICD-10-CM | POA: Diagnosis not present

## 2023-01-30 DIAGNOSIS — I4891 Unspecified atrial fibrillation: Secondary | ICD-10-CM | POA: Diagnosis not present

## 2023-01-30 LAB — POCT INR: INR: 1.6 — AB (ref 2.0–3.0)

## 2023-01-30 NOTE — Patient Instructions (Signed)
Description    Take 2 tablets today, then start taking 1.5 tablets daily except 1 tablet on Tuesdays, Thursdays, and Saturdays.  Recheck INR in 2 weeks.

## 2023-02-12 ENCOUNTER — Other Ambulatory Visit
Admission: RE | Admit: 2023-02-12 | Discharge: 2023-02-12 | Disposition: A | Discharge status: Home / Self Care | Payer: Medicare Other | Source: Ambulatory Visit | Attending: Medical | Admitting: Medical

## 2023-02-12 ENCOUNTER — Ambulatory Visit: Payer: Medicare Other | Attending: Medical | Admitting: Medical

## 2023-02-12 ENCOUNTER — Ambulatory Visit
Admission: RE | Admit: 2023-02-12 | Discharge: 2023-02-12 | Disposition: A | Discharge status: Home / Self Care | Payer: Medicare Other | Source: Ambulatory Visit | Attending: Medical | Admitting: Medical

## 2023-02-12 ENCOUNTER — Encounter: Payer: Self-pay | Admitting: Medical

## 2023-02-12 VITALS — BP 119/74 | HR 69 | Ht 68.0 in | Wt 180.0 lb

## 2023-02-12 DIAGNOSIS — Z79899 Other long term (current) drug therapy: Secondary | ICD-10-CM

## 2023-02-12 DIAGNOSIS — R0989 Other specified symptoms and signs involving the circulatory and respiratory systems: Secondary | ICD-10-CM

## 2023-02-12 DIAGNOSIS — I5020 Unspecified systolic (congestive) heart failure: Secondary | ICD-10-CM | POA: Diagnosis not present

## 2023-02-12 DIAGNOSIS — I493 Ventricular premature depolarization: Secondary | ICD-10-CM | POA: Diagnosis not present

## 2023-02-12 DIAGNOSIS — J849 Interstitial pulmonary disease, unspecified: Secondary | ICD-10-CM | POA: Diagnosis not present

## 2023-02-12 DIAGNOSIS — I5022 Chronic systolic (congestive) heart failure: Secondary | ICD-10-CM | POA: Diagnosis not present

## 2023-02-12 DIAGNOSIS — I77810 Thoracic aortic ectasia: Secondary | ICD-10-CM | POA: Diagnosis not present

## 2023-02-12 DIAGNOSIS — J9611 Chronic respiratory failure with hypoxia: Secondary | ICD-10-CM

## 2023-02-12 DIAGNOSIS — R001 Bradycardia, unspecified: Secondary | ICD-10-CM

## 2023-02-12 LAB — BRAIN NATRIURETIC PEPTIDE: B Natriuretic Peptide: 152.8 pg/mL — ABNORMAL HIGH (ref 0.0–100.0)

## 2023-02-12 LAB — CBC
HCT: 39.6 % (ref 39.0–52.0)
Hemoglobin: 12.6 g/dL — ABNORMAL LOW (ref 13.0–17.0)
MCH: 30.7 pg (ref 26.0–34.0)
MCHC: 31.8 g/dL (ref 30.0–36.0)
MCV: 96.4 fL (ref 80.0–100.0)
Platelets: 166 10*3/uL (ref 150–400)
RBC: 4.11 MIL/uL — ABNORMAL LOW (ref 4.22–5.81)
RDW: 12.8 % (ref 11.5–15.5)
WBC: 7.4 10*3/uL (ref 4.0–10.5)
nRBC: 0 % (ref 0.0–0.2)

## 2023-02-12 LAB — BASIC METABOLIC PANEL
Anion gap: 10 (ref 5–15)
BUN: 29 mg/dL — ABNORMAL HIGH (ref 8–23)
CO2: 23 mmol/L (ref 22–32)
Calcium: 8.7 mg/dL — ABNORMAL LOW (ref 8.9–10.3)
Chloride: 105 mmol/L (ref 98–111)
Creatinine, Ser: 1.37 mg/dL — ABNORMAL HIGH (ref 0.61–1.24)
GFR, Estimated: 53 mL/min — ABNORMAL LOW (ref >60)
Glucose, Bld: 117 mg/dL — ABNORMAL HIGH (ref 70–99)
Potassium: 4 mmol/L (ref 3.5–5.1)
Sodium: 138 mmol/L (ref 135–145)

## 2023-02-12 MED ORDER — CARVEDILOL 3.125 MG PO TABS: 3.1250 mg | ORAL_TABLET | Freq: Two times a day (BID) | ORAL | 0 refills | Status: DC

## 2023-02-12 NOTE — Progress Notes (Unsigned)
Cardiology Office Note:    Date:  02/12/2023   ID:  Mitchell Barnes, DOB 04-18-1946, MRN [id number]  PCP:  Leeroy Cha, MD  [facility] Cardiologist:  Ida Rogue, MD  [place] Electrophysiologist:  None   Referring MD: Leeroy Cha,*   Chief Complaint: heart monitor follow-up  History of Present Illness:    Mitchell Barnes is a 77 y.o. male with a hx of seizures, anemia, hyperlipidemia, stress cardiomyopathy with improvement of LVEF, hypertension, aortic atherosclerosis with aneurysmal dilation of the aortic root measuring 4.4 cm who presents for heart monitor follow-up   Patient was hospitalized in August 2021 for chest pain.  He had a negative stress test at that time.  CT attenuation images showed coronary calcification and minimal aortic atherosclerosis.  Echo in May 2021 showed LVEF 55 to 60%.   Patient was admitted in May 2023 for CVA, COVID-19, acute respiratory failure, newly reduced EF.  Echo showed EF 35 to 40%, severe global hypokinesis, unable to exclude stress cardiomyopathy.  Unable to exclude mural thrombus, this the patient was started on anticoagulation.  Patient was started on guideline directed medical therapy for CHF.  Follow-up echo in August 2023 showed improved EF 50 to 55%.   Patient was seen December 2023 was still on warfarin, with the decision to continue warfarin to minimize risk of recurrent stroke.  Patient was last seen January 01, 2023 reporting heart rates in the 30s during the day.  Coreg was stopped.  Labs were drawn and a 30-day heart monitor was ordered.Heart monitor showed predominantly sinus rhythm, bundle branch block/IV CAD present, brief runs of SVT longest lasting 20.6 seconds, minimum heart rate 46 bpm, average heart rate of 65 bpm (67bpm on the other monitor), PVC burden of 25.4%  (Pvc burden 23.7% on the other monitor).  Today, the heart monitor was reviewed.  Average heart rate of 65 bpm with a  minimum heart rate of 46 bpm is overall reassuring.  Significant PVC burden noted on both monitors.  PVCs likely explaining low heart rate on pulse oximeter. The patient is overall feeling okay, he does note increased weakness. No significant chest pain, SOB, palpitations, lower leg edema. He has taken lasix three times since the last visit. He is on baseline 3L O2. Crackles noted on exam. He denies recent fever, chills, lower leg edema, cough, or congestion. May have dry cough occasionally.   Past Medical History:  Diagnosis Date   Anemia    Aortic atherosclerosis ([place]) 04/28/2022   HLD (hyperlipidemia)    Seizures (HCC)     Past Surgical History:  Procedure Laterality Date   EYE SURGERY      Current Medications: Current Meds  Medication Sig   acetaminophen (TYLENOL) 650 MG CR tablet Take 650 mg by mouth every 8 (eight) hours as needed for pain or fever.   carvedilol (COREG) 3.125 MG tablet Take 1 tablet (3.125 mg total) by mouth 2 (two) times daily.   dapagliflozin propanediol (FARXIGA) 10 MG TABS tablet Take 1 tablet (10 mg total) by mouth daily before breakfast.   diphenhydrAMINE (BENADRYL) 25 MG tablet Take 25 mg by mouth every 6 (six) hours as needed for itching.   ferrous sulfate 325 (65 FE) MG tablet Take 325 mg by mouth every Monday, Wednesday, and Friday. Monday, Thursday   furosemide (LASIX) 20 MG tablet Take 20 mg by mouth as needed.   PHENobarbital (LUMINAL) 32.4 MG tablet Take 64.8 mg by mouth 2 (two) times daily.  Potassium Chloride ER 20 MEQ TBCR Take 1 tablet by mouth daily as needed.   rosuvastatin (CRESTOR) 20 MG tablet Take 1 tablet (20 mg total) by mouth daily.   vitamin B-12 (CYANOCOBALAMIN) 500 MCG tablet Take 500 mcg by mouth daily.   Vitamin D, Ergocalciferol, (DRISDOL) 1.25 MG (50000 UNIT) CAPS capsule Take 50,000 Units by mouth every Tuesday.   warfarin (COUMADIN) 5 MG tablet TAKE 1-2 TABLETS DAILY OR AS PRESCRIBED BY CLINIC     Allergies:   Atorvastatin,  Atorvastatin calcium, Fluconazole, and Phenytoin   Social History   Socioeconomic History   Marital status: Married    Spouse name: Not on file   Number of children: Not on file   Years of education: Not on file   Highest education level: Not on file  Occupational History   Not on file  Tobacco Use   Smoking status: Never   Smokeless tobacco: Never  Vaping Use   Vaping Use: Never used  Substance and Sexual Activity   Alcohol use: Not Currently   Drug use: No   Sexual activity: Yes  Other Topics Concern   Not on file  Social History Narrative   Not on file   Social Determinants of Health   Financial Resource Strain: Not on file  Food Insecurity: Not on file  Transportation Needs: Not on file  Physical Activity: Not on file  Stress: Not on file  Social Connections: Not on file     Family History: The patient's family history includes Heart attack in his father.  ROS:   Please see the history of present illness.     All other systems reviewed and are negative.  EKGs/Labs/Other Studies Reviewed:    The following studies were reviewed today:  Echo 07/2022  1. Left ventricular ejection fraction, by estimation, is 50 to 55%. The  left ventricle has low normal function.   2. The mitral valve is normal in structure. No evidence of mitral valve  regurgitation.   3. The aortic valve is normal in structure. Aortic valve regurgitation is  not visualized.    Echo 04/2022 1. Left ventricular ejection fraction, by estimation, is 35 to 40%. The  left ventricle has moderately decreased function. The left ventricle  demonstrates severe global hypokinesis, basal regions best preserved  (possibly consistent with stress  cardiomyopathy). The left ventricular internal cavity size was mildly  dilated. Left ventricular diastolic parameters are consistent with Grade I  diastolic dysfunction (impaired relaxation).   2. Unable to exclude mural thrombus   3. Right ventricular  systolic function is normal. The right ventricular  size is normal.   4. The mitral valve is normal in structure. No evidence of mitral valve  regurgitation. No evidence of mitral stenosis.   5. The aortic valve is normal in structure. Aortic valve regurgitation is  not visualized. Aortic valve sclerosis is present, with no evidence of  aortic valve stenosis.   6. There is mild dilatation of the aortic root, measuring 40 mm.   7. The inferior vena cava is normal in size with greater than 50%  respiratory variability, suggesting right atrial pressure of 3 mmHg.    Myoview lexiscan 2023 Narrative & Impression  Pharmacological myocardial perfusion imaging study with no significant  ischemia Normal wall motion, EF estimated at 81% No EKG changes concerning for ischemia at peak stress or in recovery. CT attenuation correction images with coronary calcification noted, minimal aortic atherosclerosis Low risk scan     Signed, Tim  Rockey Situ, MD, Ph.D [facility]    EKG:  EKG is ordered today.  The ekg ordered today demonstrates normal sinus rhythm, 69 bpm, LAD, right bundle branch block, nonspecific ST and T wave changes  Recent Labs: 09/19/2022: ALT 14; B Natriuretic Peptide 267.8; Hemoglobin 10.3; Platelets 183 01/01/2023: BUN 30; Creatinine, Ser 1.51; Magnesium 2.7; Potassium 5.0; Sodium 140; TSH 1.182  Recent Lipid Panel    Component Value Date/Time   CHOL 174 04/19/2022 0423   TRIG 61 04/19/2022 0423   HDL 71 04/19/2022 0423   CHOLHDL 2.5 04/19/2022 0423   VLDL 12 04/19/2022 0423   LDLCALC 91 04/19/2022 0423     Physical Exam:    VS:  BP 119/74 (BP Location: Left Arm, Patient Position: Sitting, Cuff Size: Normal)   Pulse 69   Ht '5\' 8"'$  (1.727 m)   Wt 180 lb (81.6 kg)   SpO2 98%   BMI 27.37 kg/m     Wt Readings from Last 3 Encounters:  02/12/23 180 lb (81.6 kg)  01/01/23 179 lb 9.6 oz (81.5 kg)  11/23/22 179 lb 4 oz (81.3 kg)     GEN:  Well nourished, well developed  in no acute distress HEENT: Normal NECK: No JVD; No carotid bruits LYMPHATICS: No lymphadenopathy CARDIAC: RRR, no murmurs, rubs, gallops RESPIRATORY:  crackles at bases  ABDOMEN: Soft, non-tender, non-distended MUSCULOSKELETAL:  No edema; No deformity  SKIN: Warm and dry NEUROLOGIC:  Alert and oriented x 3 PSYCHIATRIC:  Normal affect   ASSESSMENT:    1. Bradycardia   2. Abnormal lung sounds   3. Chronic respiratory failure with hypoxia (HCC)   4. Medication management   5. Chronic systolic heart failure ([place])   6. PVC (premature ventricular contraction)   7. Aortic root dilation (HCC)    PLAN:    In order of problems listed above:  Bradycardia Heart monitor showed NSR with an average heart rate of of 65bpm and a minimum heart rate of 46bpm. Significant PVC burden noted on the monitor, which is likely contributing to falsely low heart rate on pulse oximeter. Heart rate today 69bpm on EKG.   Crackles at bases/Abnormal lung sounds Chronic respiratory failure on 3L O2 Crackles noted at bases on exam. He has chronic respiratory failure on 3L O2. He denies significant SOB, overall feeling weak. He denies recent fever or chills. May have a dry cough. No lower leg edema on exam noted. He has been taking lasix '20mg'$  as needed for swelling. I will order a CXR, CBC and BNP.   HFmrEF Patient has a h/o stress induced CM with improvement of EF. Most recent echo 07/2022 showed improved LVEF 50-55%. The patient has no lower leg edema, but crackles at the bases of his lungs. He takes lasix '20mg'$  as needed for volume management. He has taken in about 3 times since the last visit. Plan as above. He recently started [place], plan for BMET in 2 weeks.   PVCs Heart monitor showed 25.4% PVC on one and 23.7% PVC ano the second. This is explaing the low heart rate on home pulse oximeter. I will restart low dose BB. Recommend repeat heart monitor at follow-up evaluate PVC burden.  Most recent limited echo  in August 2023 showed improved EF 50 to 55%.  Would consider repeat echocardiogram at follow-up.  I discussed EP referral, but patient is not wanting to see a specialist at this time..   Dilated root 4.4 cm on most recent imaging. Can repeat imaging at  follow-up.   Disposition: Follow up in 3 month(s) with MD/APP     Signed, Jamese Trauger Ninfa Meeker, PA-C  02/12/2023 3:33 PM    [facility]

## 2023-02-12 NOTE — Patient Instructions (Addendum)
Medication Instructions:  RESTART carvedilol 3.125 mg by mouth twice a day  *If you need a refill on your cardiac medications before your next appointment, please call your pharmacy*  Lab Work: CBC and  BNP to be drawn today BMP to be drawn in 2 weeks - Please go to the [facility]. You will check in at the front desk to the right as you walk into the atrium. Valet Parking is offered if needed. - No appointment needed. You may go any day between 7 am and 6 pm.  If you have labs (blood work) drawn today and your tests are completely normal, you will receive your results only by: [place] (if you have MyChart) OR A paper copy in the mail If you have any lab test that is abnormal or we need to change your treatment, we will call you to review the results.  Testing/Procedures: chest X-ray  Follow-Up: At [facility], you and your health needs are our priority.  As part of our continuing mission to provide you with exceptional heart care, we have created designated Provider Care Teams.  These Care Teams include your primary Cardiologist (physician) and Advanced Practice Providers (APPs -  Physician Assistants and Nurse Practitioners) who all work together to provide you with the care you need, when you need it.  We recommend signing up for the patient portal called "MyChart".  Sign up information is provided on this After Visit Summary.  MyChart is used to connect with patients for Virtual Visits (Telemedicine).  Patients are able to view lab/test results, encounter notes, upcoming appointments, etc.  Non-urgent messages can be sent to your provider as well.   To learn more about what you can do with MyChart, go to [email or web address].    Your next appointment:   3 month(s)  Provider:   Ida Rogue, MD

## 2023-02-13 ENCOUNTER — Ambulatory Visit: Payer: Medicare Other | Attending: Cardiovascular Disease

## 2023-02-13 DIAGNOSIS — I639 Cerebral infarction, unspecified: Secondary | ICD-10-CM

## 2023-02-13 DIAGNOSIS — I4891 Unspecified atrial fibrillation: Secondary | ICD-10-CM | POA: Diagnosis not present

## 2023-02-13 DIAGNOSIS — Z7901 Long term (current) use of anticoagulants: Secondary | ICD-10-CM | POA: Diagnosis not present

## 2023-02-13 LAB — POCT INR: INR: 2 (ref 2.0–3.0)

## 2023-02-13 NOTE — Patient Instructions (Signed)
Description    Take 2 tablets today, then resume same dosage 1.5 tablets daily except 1 tablet on Tuesdays, Thursdays, and Saturdays.  Recheck INR in 3 weeks.

## 2023-02-27 ENCOUNTER — Ambulatory Visit
Payer: Medicare Other | Attending: Cardiovascular Disease | Admitting: Pharmacist Clinician (PhC)/ Clinical Pharmacy Specialist

## 2023-02-27 DIAGNOSIS — I639 Cerebral infarction, unspecified: Secondary | ICD-10-CM | POA: Diagnosis not present

## 2023-02-27 DIAGNOSIS — I4891 Unspecified atrial fibrillation: Secondary | ICD-10-CM | POA: Diagnosis not present

## 2023-02-27 DIAGNOSIS — Z7901 Long term (current) use of anticoagulants: Secondary | ICD-10-CM | POA: Diagnosis not present

## 2023-02-27 LAB — POCT INR: INR: 1.7 — AB (ref 2.0–3.0)

## 2023-02-27 NOTE — Patient Instructions (Signed)
Increase dose to 1.5 tablets daily except 1 tablet on Tuesdays and Saturdays.  Recheck INR in 2-3 weeks.

## 2023-03-04 DIAGNOSIS — J9601 Acute respiratory failure with hypoxia: Secondary | ICD-10-CM | POA: Diagnosis not present

## 2023-03-04 DIAGNOSIS — I5042 Chronic combined systolic (congestive) and diastolic (congestive) heart failure: Secondary | ICD-10-CM | POA: Diagnosis not present

## 2023-03-13 ENCOUNTER — Encounter

## 2023-03-13 ENCOUNTER — Ambulatory Visit: Payer: Medicare Other | Attending: Cardiovascular Disease

## 2023-03-13 DIAGNOSIS — I4891 Unspecified atrial fibrillation: Secondary | ICD-10-CM | POA: Diagnosis not present

## 2023-03-13 DIAGNOSIS — Z7901 Long term (current) use of anticoagulants: Secondary | ICD-10-CM

## 2023-03-13 DIAGNOSIS — I639 Cerebral infarction, unspecified: Secondary | ICD-10-CM | POA: Diagnosis not present

## 2023-03-13 LAB — POCT INR: INR: 1.2 — AB (ref 2.0–3.0)

## 2023-03-13 NOTE — Patient Instructions (Signed)
Description   Take 2 tablets today and tomorrow, then resume same dosage 1.5 tablets daily except 1 tablet on Tuesdays and Saturdays.  Recheck INR in 1 week.

## 2023-03-20 ENCOUNTER — Ambulatory Visit: Payer: Medicare Other | Attending: Cardiovascular Disease | Admitting: *Deleted

## 2023-03-20 DIAGNOSIS — Z5181 Encounter for therapeutic drug level monitoring: Secondary | ICD-10-CM

## 2023-03-20 DIAGNOSIS — I639 Cerebral infarction, unspecified: Secondary | ICD-10-CM

## 2023-03-20 LAB — POCT INR: INR: 2 (ref 2.0–3.0)

## 2023-03-20 NOTE — Patient Instructions (Signed)
Continue warfarin 1.5 tablets daily except 1 tablet on Tuesdays and Saturdays.  Recheck INR in 3 weeks.

## 2023-04-03 DIAGNOSIS — J9601 Acute respiratory failure with hypoxia: Secondary | ICD-10-CM | POA: Diagnosis not present

## 2023-04-03 DIAGNOSIS — I5042 Chronic combined systolic (congestive) and diastolic (congestive) heart failure: Secondary | ICD-10-CM | POA: Diagnosis not present

## 2023-04-10 ENCOUNTER — Ambulatory Visit: Payer: Medicare Other | Attending: Cardiovascular Disease | Admitting: *Deleted

## 2023-04-10 DIAGNOSIS — I639 Cerebral infarction, unspecified: Secondary | ICD-10-CM

## 2023-04-10 DIAGNOSIS — Z5181 Encounter for therapeutic drug level monitoring: Secondary | ICD-10-CM | POA: Diagnosis not present

## 2023-04-10 LAB — POCT INR: INR: 2.3 (ref 2.0–3.0)

## 2023-04-10 NOTE — Patient Instructions (Signed)
Continue warfarin 1.5 tablets daily except 1 tablet on Tuesdays and Saturdays.  Recheck INR in 4 weeks.

## 2023-05-04 DIAGNOSIS — I5042 Chronic combined systolic (congestive) and diastolic (congestive) heart failure: Secondary | ICD-10-CM | POA: Diagnosis not present

## 2023-05-04 DIAGNOSIS — J9601 Acute respiratory failure with hypoxia: Secondary | ICD-10-CM | POA: Diagnosis not present

## 2023-05-08 ENCOUNTER — Ambulatory Visit: Payer: Medicare Other | Attending: Cardiovascular Disease

## 2023-05-08 DIAGNOSIS — I639 Cerebral infarction, unspecified: Secondary | ICD-10-CM | POA: Diagnosis not present

## 2023-05-08 DIAGNOSIS — Z7901 Long term (current) use of anticoagulants: Secondary | ICD-10-CM

## 2023-05-08 LAB — POCT INR: INR: 2.6 (ref 2.0–3.0)

## 2023-05-08 NOTE — Patient Instructions (Signed)
Continue warfarin 1.5 tablets daily except 1 tablet on Tuesdays and Saturdays.  Recheck INR in 6 weeks.

## 2023-05-19 NOTE — Progress Notes (Unsigned)
Cardiology Office Note  Date:  05/20/2023   ID:  Mitchell Barnes, DOB [birth year], MRN [id number]  PCP:  Lorenda Ishihara, MD   Chief Complaint  Patient presents with   3 month follow up     Patient c/o shortness of breath with over exertion. Medications reviewed by the patient verbally.     HPI:  Mitchell Barnes is a 77 y.o. male with a hx of seizures,  Anemia, HLD,  HTN,   Nonsmoker, no diabetes HAB1C 5.8 Aortic atherosclerosis with aneurysmal dilatation of the aortic root measuring 4.4 cm chest pain, prior hospitalization August 2021, negative stress test Cardiomyopathy 04/2022 EF 35 to 40% after stroke Limited echo August 2023 EF 50 to 55% Who presents for follow-up of his chest pain, PVCs  Last seen by myself in clinic December 2023 Seen by one of our providers March 2024 Coreg held for reported bradycardia  Zio monitor showed predominantly sinus rhythm, bundle branch block/IV CAD present, brief runs of SVT longest lasting 20.6 seconds, minimum heart rate 46 bpm, average heart rate of 65 bpm (67bpm on the other monitor), PVC burden of 25.4%  (Pvc burden 23.7% on the other monitor).   It was felt bradycardia was secondary to PVCs echo 07/2022 showed improved LVEF 50-55%  He declined referral to EP, low-dose beta-blocker was restarted  Presents today on oxygen 2.5 up to 3 L Takes Lasix as needed, denies significant leg swelling or abdominal distention, no significant change in weight  Recent lab work reviewed HGB 12 CR 1.3 to 1.4  Activity limited by Arthritis pain, hands , elbow Cramps in legs Carrying groceries, causing cramps in his hands, elbows  Out of crestor 1 week  EKG personally reviewed by myself on todays visit Normal sinus rhythm rate 70 bpm right bundle branch block No change from prior EKGs  Other past medical history reviewed Seen in the emergency room September 19, 2022 for fatigue Hemoglobin 10 CT scan chest: Showing postacute  COVID pulmonary disease Remains on oxygen 2 to 3 L, mild desaturation on ambulation in the house  hospital admissions in Apr 18 2021 (cva) and May 22-26 (covid) hospital admission with COVID-19 infection hypoxic respiratory failure Apr 27, 2022 stroke on Eliquis and aspirin  Echocardiogram Apr 19, 2022 after CVA ejection fraction 35 to 40%, severe global hypokinesis, unable to exclude stress cardiomyopathy Unable to exclude mural thrombus, Was started on anticoagulation  Prior ejection fraction in May 2021 was 55 to 60%  Echo 04/16/20 LVEF 55-60%, mid LVH, gr1DD, RV normal size and function, mild dilation aortic root 37mm.  Lexiscan myoview 04/16/20 with no significant ischemia, no EKG changes, CT attenuation correction images with coronary calcification and minimal aortic atherosclerosis.     lexiscan Myoview 04/16/20 Study Result   Pharmacological myocardial perfusion imaging study with no significant  ischemia Normal wall motion, EF estimated at 81% No EKG changes concerning for ischemia at peak stress or in recovery. CT attenuation correction images with coronary calcification noted, minimal aortic atherosclerosis Low risk scan      Echo 04/16/20  1. Left ventricular ejection fraction, by estimation, is 55 to 60%. The  left ventricle has normal function. The left ventricle has no regional  wall motion abnormalities. There is mild left ventricular hypertrophy.  Left ventricular diastolic parameters  are consistent with Grade I diastolic dysfunction (impaired relaxation).   2. Right ventricular systolic function is normal. The right ventricular  size is normal. Tricuspid regurgitation signal is inadequate for assessing  PA pressure.   3. There is mild dilatation of the aortic root measuring 37 mm.    PMH:   has a past medical history of Anemia, Aortic atherosclerosis (HCC) (04/28/2022), HLD (hyperlipidemia), and Seizures (HCC).  PSH:    Past Surgical History:  Procedure  Laterality Date   EYE SURGERY      Current Outpatient Medications  Medication Sig Dispense Refill   acetaminophen (TYLENOL) 650 MG CR tablet Take 650 mg by mouth every 8 (eight) hours as needed for pain or fever.     carvedilol (COREG) 3.125 MG tablet Take 1 tablet (3.125 mg total) by mouth 2 (two) times daily. 180 tablet 0   ferrous sulfate 325 (65 FE) MG tablet Take 325 mg by mouth every Monday, Wednesday, and Friday. Monday, Thursday     furosemide (LASIX) 20 MG tablet Take 20 mg by mouth as needed.     PHENobarbital (LUMINAL) 32.4 MG tablet Take 64.8 mg by mouth 2 (two) times daily.     Potassium Chloride ER 20 MEQ TBCR Take 1 tablet by mouth daily as needed.     rosuvastatin (CRESTOR) 20 MG tablet Take 1 tablet (20 mg total) by mouth daily. 30 tablet 11   vitamin B-12 (CYANOCOBALAMIN) 500 MCG tablet Take 500 mcg by mouth daily.     warfarin (COUMADIN) 5 MG tablet TAKE 1-2 TABLETS DAILY OR AS PRESCRIBED BY CLINIC 180 tablet 1   diphenhydrAMINE (BENADRYL) 25 MG tablet Take 25 mg by mouth every 6 (six) hours as needed for itching. (Patient not taking: Reported on 05/20/2023)     Vitamin D, Ergocalciferol, (DRISDOL) 1.25 MG (50000 UNIT) CAPS capsule Take 50,000 Units by mouth every Tuesday. (Patient not taking: Reported on 05/20/2023)     No current facility-administered medications for this visit.    Allergies:   Atorvastatin, Atorvastatin calcium, Fluconazole, and Phenytoin   Social History:  The patient  reports that he has never smoked. He has never used smokeless tobacco. He reports that he does not currently use alcohol. He reports that he does not use drugs.   Family History:   family history includes Heart attack in his father.   Review of Systems: Review of Systems  Constitutional: Negative.   HENT: Negative.    Respiratory: Negative.    Cardiovascular: Negative.   Gastrointestinal: Negative.   Musculoskeletal: Negative.   Neurological: Negative.   Psychiatric/Behavioral:  Negative.    All other systems reviewed and are negative.  PHYSICAL EXAM: VS:  BP 120/60 (BP Location: Left Arm, Patient Position: Sitting, Cuff Size: Normal)   Pulse 70   Ht 5\' 8"  (1.727 m)   Wt 179 lb 2 oz (81.3 kg)   SpO2 97% Comment: 3 Liters of oxygen  BMI 27.24 kg/m  , BMI Body mass index is 27.24 kg/m. Constitutional:  oriented to person, place, and time. No distress.  HENT:  Head: Grossly normal Eyes:  no discharge. No scleral icterus.  Neck: No JVD, no carotid bruits  Cardiovascular: Regular rate and rhythm, no murmurs appreciated Pulmonary/Chest: Clear to auscultation bilaterally, no wheezes  Scant Rales at the right base Abdominal: Soft.  no distension.  no tenderness.  Musculoskeletal: Normal range of motion Neurological:  normal muscle tone. Coordination normal. No atrophy Skin: Skin warm and dry Psychiatric: normal affect, pleasant   Recent Labs: 09/19/2022: ALT 14 01/01/2023: Magnesium 2.7; TSH 1.182 02/12/2023: B Natriuretic Peptide 152.8; BUN 29; Creatinine, Ser 1.37; Hemoglobin 12.6; Platelets 166; Potassium 4.0; Sodium 138  Lipid Panel Lab Results  Component Value Date   CHOL 174 04/19/2022   HDL 71 04/19/2022   LDLCALC 91 04/19/2022   TRIG 61 04/19/2022      Wt Readings from Last 3 Encounters:  05/20/23 179 lb 2 oz (81.3 kg)  02/12/23 180 lb (81.6 kg)  01/01/23 179 lb 9.6 oz (81.5 kg)     ASSESSMENT AND PLAN:  Problem List Items Addressed This Visit       Cardiology Problems   Atrial fibrillation (HCC)   CVA (cerebral vascular accident) (HCC) - Primary   Aortic atherosclerosis (HCC)   Aortic root dilation (HCC)   Other Visit Diagnoses     Bradycardia       Chronic respiratory failure with hypoxia (HCC)       Chronic systolic heart failure (HCC)       PVC (premature ventricular contraction)         Stroke  recovered with improved peripheral vision Prior cardiomyopathy, unable to exclude mural thrombus or other etiology of his  stroke, started on warfarin at the time Although ejection fraction has normalized, he prefers to stay on warfarin in effort to minimize stroke risk  Hyperlipidemia Continue Crestor  Dilated aortic root 4.4 cm on imaging Not a surgical candidate  Stress cardiomyopathy Reports he is no longer on Farxiga Tolerating Coreg 3.125 twice daily Uses Lasix as needed Ejection fraction improved on echo August 2023,  ef 50 to 55% Denies orthostasis symptoms  Acute on chronic respiratory failure History of respiratory distress on chronic oxygen, scant Rales at the bases, CT scan with post-COVID syndrome   Total encounter time more than 30 minutes  Greater than 50% was spent in counseling and coordination of care with the patient   Signed, Dossie Arbour, M.D., Ph.D. [facility] [place], [place] [phone]

## 2023-05-20 ENCOUNTER — Ambulatory Visit: Payer: Medicare Other | Attending: Cardiovascular Disease | Admitting: Cardiovascular Disease

## 2023-05-20 ENCOUNTER — Encounter: Payer: Self-pay | Admitting: Cardiovascular Disease

## 2023-05-20 VITALS — BP 120/60 | HR 70 | Ht 68.0 in | Wt 179.1 lb

## 2023-05-20 DIAGNOSIS — I4891 Unspecified atrial fibrillation: Secondary | ICD-10-CM | POA: Diagnosis not present

## 2023-05-20 DIAGNOSIS — I77810 Thoracic aortic ectasia: Secondary | ICD-10-CM | POA: Diagnosis not present

## 2023-05-20 DIAGNOSIS — R001 Bradycardia, unspecified: Secondary | ICD-10-CM | POA: Diagnosis not present

## 2023-05-20 DIAGNOSIS — I493 Ventricular premature depolarization: Secondary | ICD-10-CM

## 2023-05-20 DIAGNOSIS — I700 Atherosclerosis of aorta: Secondary | ICD-10-CM | POA: Diagnosis not present

## 2023-05-20 DIAGNOSIS — I5022 Chronic systolic (congestive) heart failure: Secondary | ICD-10-CM | POA: Diagnosis not present

## 2023-05-20 DIAGNOSIS — J9611 Chronic respiratory failure with hypoxia: Secondary | ICD-10-CM | POA: Diagnosis not present

## 2023-05-20 DIAGNOSIS — I639 Cerebral infarction, unspecified: Secondary | ICD-10-CM

## 2023-05-20 NOTE — Patient Instructions (Signed)
Medication Instructions:  No changes  If you need a refill on your cardiac medications before your next appointment, please call your pharmacy.   Lab work: No new labs needed  Testing/Procedures: No new testing needed  Follow-Up: At [facility], you and your health needs are our priority.  As part of our continuing mission to provide you with exceptional heart care, we have created designated Provider Care Teams.  These Care Teams include your primary Cardiologist (physician) and Advanced Practice Providers (APPs -  Physician Assistants and Nurse Practitioners) who all work together to provide you with the care you need, when you need it.  You will need a follow up appointment in 12 months  Providers on your designated Care Team:   Christopher Berge, NP Ryan Dunn, PA-C Cadence Furth, PA-C  COVID-19 Vaccine Information can be found at: [email or web address] For questions related to vaccine distribution or appointments, please email [email or web address] or call [phone].   

## 2023-05-24 ENCOUNTER — Other Ambulatory Visit: Payer: Self-pay | Admitting: Cardiovascular Disease

## 2023-05-24 DIAGNOSIS — I4891 Unspecified atrial fibrillation: Secondary | ICD-10-CM

## 2023-05-24 NOTE — Telephone Encounter (Signed)
Refill request

## 2023-06-03 DIAGNOSIS — I5042 Chronic combined systolic (congestive) and diastolic (congestive) heart failure: Secondary | ICD-10-CM | POA: Diagnosis not present

## 2023-06-03 DIAGNOSIS — J9601 Acute respiratory failure with hypoxia: Secondary | ICD-10-CM | POA: Diagnosis not present

## 2023-06-19 ENCOUNTER — Ambulatory Visit: Payer: Medicare Other | Attending: Cardiovascular Disease

## 2023-06-19 DIAGNOSIS — I639 Cerebral infarction, unspecified: Secondary | ICD-10-CM

## 2023-06-19 DIAGNOSIS — Z7901 Long term (current) use of anticoagulants: Secondary | ICD-10-CM

## 2023-06-19 LAB — POCT INR: INR: 2.5 (ref 2.0–3.0)

## 2023-06-19 NOTE — Patient Instructions (Signed)
Continue warfarin 1.5 tablets daily except 1 tablet on Tuesdays and Saturdays.  Recheck INR in 6 weeks.

## 2023-07-04 DIAGNOSIS — J9601 Acute respiratory failure with hypoxia: Secondary | ICD-10-CM | POA: Diagnosis not present

## 2023-07-04 DIAGNOSIS — I5042 Chronic combined systolic (congestive) and diastolic (congestive) heart failure: Secondary | ICD-10-CM | POA: Diagnosis not present

## 2023-07-31 ENCOUNTER — Ambulatory Visit: Payer: Medicare Other | Attending: Cardiovascular Disease

## 2023-07-31 DIAGNOSIS — Z7901 Long term (current) use of anticoagulants: Secondary | ICD-10-CM | POA: Diagnosis not present

## 2023-07-31 DIAGNOSIS — I639 Cerebral infarction, unspecified: Secondary | ICD-10-CM | POA: Diagnosis not present

## 2023-07-31 LAB — POCT INR: INR: 2.9 (ref 2.0–3.0)

## 2023-07-31 NOTE — Patient Instructions (Signed)
Continue warfarin 1.5 tablets daily except 1 tablet on Tuesdays and Saturdays.  Recheck INR in 6 weeks.

## 2023-09-11 ENCOUNTER — Ambulatory Visit: Payer: Medicare Other | Attending: Cardiovascular Disease

## 2023-09-11 DIAGNOSIS — I639 Cerebral infarction, unspecified: Secondary | ICD-10-CM | POA: Diagnosis not present

## 2023-09-11 DIAGNOSIS — Z7901 Long term (current) use of anticoagulants: Secondary | ICD-10-CM

## 2023-09-11 LAB — POCT INR: INR: 1.6 — AB (ref 2.0–3.0)

## 2023-09-11 NOTE — Patient Instructions (Signed)
TAKE 2 TABLETS TODAY ONLY THEN Continue warfarin 1.5 tablets daily except 1 tablet on Tuesdays and Saturdays.  Recheck INR in 6 weeks.

## 2023-09-30 ENCOUNTER — Other Ambulatory Visit: Payer: Self-pay | Admitting: Internal Medicine

## 2023-09-30 DIAGNOSIS — I5042 Chronic combined systolic (congestive) and diastolic (congestive) heart failure: Secondary | ICD-10-CM | POA: Diagnosis not present

## 2023-09-30 DIAGNOSIS — Z0000 Encounter for general adult medical examination without abnormal findings: Secondary | ICD-10-CM | POA: Diagnosis not present

## 2023-09-30 DIAGNOSIS — M810 Age-related osteoporosis without current pathological fracture: Secondary | ICD-10-CM

## 2023-09-30 DIAGNOSIS — I700 Atherosclerosis of aorta: Secondary | ICD-10-CM | POA: Diagnosis not present

## 2023-09-30 DIAGNOSIS — E559 Vitamin D deficiency, unspecified: Secondary | ICD-10-CM | POA: Diagnosis not present

## 2023-09-30 DIAGNOSIS — M47814 Spondylosis without myelopathy or radiculopathy, thoracic region: Secondary | ICD-10-CM | POA: Diagnosis not present

## 2023-09-30 DIAGNOSIS — N1831 Chronic kidney disease, stage 3a: Secondary | ICD-10-CM | POA: Diagnosis not present

## 2023-09-30 DIAGNOSIS — Z23 Encounter for immunization: Secondary | ICD-10-CM | POA: Diagnosis not present

## 2023-09-30 DIAGNOSIS — Z91013 Allergy to seafood: Secondary | ICD-10-CM | POA: Diagnosis not present

## 2023-09-30 DIAGNOSIS — L600 Ingrowing nail: Secondary | ICD-10-CM | POA: Diagnosis not present

## 2023-09-30 DIAGNOSIS — E7800 Pure hypercholesterolemia, unspecified: Secondary | ICD-10-CM | POA: Diagnosis not present

## 2023-10-09 ENCOUNTER — Encounter: Payer: Self-pay | Admitting: Podiatry

## 2023-10-09 ENCOUNTER — Ambulatory Visit: Payer: Medicare Other | Admitting: Podiatry

## 2023-10-09 VITALS — Ht 68.0 in | Wt 179.1 lb

## 2023-10-09 DIAGNOSIS — L600 Ingrowing nail: Secondary | ICD-10-CM

## 2023-10-09 MED ORDER — NEOMYCIN-POLYMYXIN-HC 3.5-10000-1 OT SUSP: OTIC | 0 refills | Status: AC

## 2023-10-09 NOTE — Patient Instructions (Signed)

## 2023-10-09 NOTE — Progress Notes (Signed)
  Subjective:  Patient ID: Mitchell Barnes, male    DOB: 1946-06-20,  MRN: [id number]  Chief Complaint  Patient presents with   Ingrown Toenail    Ingrown to left greater toe, pt had pedicure 6 weeks ago and a week after greater toe nail started to hurt, he tried to clip away some of the nail with no relief.    77 y.o. male presents with the above complaint. History confirmed with patient.   Objective:  Physical Exam: warm, good capillary refill, no trophic changes or ulcerative lesions, normal DP and PT pulses, normal sensory exam, and ingrown left hallux lateral border.  Assessment:   1. Ingrowing left great toenail      Plan:  Patient was evaluated and treated and all questions answered.    Ingrown Nail, left -Patient elects to proceed with minor surgery to remove ingrown toenail today. Consent reviewed and signed by patient. -Ingrown nail excised. See procedure note. -Educated on post-procedure care including soaking. Written instructions provided and reviewed. -Rx for Cortisporin sent to pharmacy. -Advised on signs and symptoms of infection developing.  We discussed that the phenol likely will create some redness and edema and tenderness around the nailbed as long as it is localized this is to be expected.  Will return as needed if any infection signs develop -Advised to leave dressing on for 48 hours due to his use of warfarin  Procedure: Excision of Ingrown Toenail Location: Left 1st toe lateral nail borders. Anesthesia: Lidocaine 1% plain; 1.5 mL and Marcaine 0.5% plain; 1.5 mL, digital block. Skin Prep: Betadine. Dressing: Silvadene; telfa; dry, sterile, compression dressing. Technique: Following skin prep, the toe was exsanguinated and a tourniquet was secured at the base of the toe. The affected nail border was freed, split with a nail splitter, and excised. Chemical matrixectomy was then performed with phenol and irrigated out with alcohol. The tourniquet was  then removed and sterile dressing applied. Disposition: Patient tolerated procedure well.    No follow-ups on file.

## 2023-10-10 DIAGNOSIS — Z9989 Dependence on other enabling machines and devices: Secondary | ICD-10-CM | POA: Diagnosis not present

## 2023-10-10 DIAGNOSIS — R918 Other nonspecific abnormal finding of lung field: Secondary | ICD-10-CM | POA: Diagnosis not present

## 2023-10-10 DIAGNOSIS — I5022 Chronic systolic (congestive) heart failure: Secondary | ICD-10-CM | POA: Diagnosis not present

## 2023-10-10 DIAGNOSIS — R0902 Hypoxemia: Secondary | ICD-10-CM | POA: Diagnosis not present

## 2023-10-23 ENCOUNTER — Ambulatory Visit: Payer: Medicare Other | Attending: Cardiovascular Disease

## 2023-10-23 DIAGNOSIS — Z7901 Long term (current) use of anticoagulants: Secondary | ICD-10-CM | POA: Diagnosis not present

## 2023-10-23 DIAGNOSIS — I639 Cerebral infarction, unspecified: Secondary | ICD-10-CM

## 2023-10-23 LAB — POCT INR: INR: 3.3 — AB (ref 2.0–3.0)

## 2023-10-23 NOTE — Patient Instructions (Signed)
TAKE 1 TABLET TODAY THEN Continue warfarin 1.5 tablets daily except 1 tablet on Tuesdays and Saturdays.  Recheck INR in 6 weeks.

## 2023-11-17 ENCOUNTER — Other Ambulatory Visit: Payer: Self-pay | Admitting: Cardiovascular Disease

## 2023-11-17 DIAGNOSIS — I4891 Unspecified atrial fibrillation: Secondary | ICD-10-CM

## 2023-12-03 ENCOUNTER — Ambulatory Visit: Payer: Medicare Other | Attending: Cardiovascular Disease

## 2023-12-03 DIAGNOSIS — Z7901 Long term (current) use of anticoagulants: Secondary | ICD-10-CM | POA: Diagnosis not present

## 2023-12-03 DIAGNOSIS — I639 Cerebral infarction, unspecified: Secondary | ICD-10-CM | POA: Diagnosis not present

## 2023-12-03 LAB — POCT INR: INR: 2.1 (ref 2.0–3.0)

## 2023-12-03 NOTE — Patient Instructions (Signed)
 Continue warfarin 1.5 tablets daily except 1 tablet on Tuesdays and Saturdays.  Recheck INR in 6 weeks.  [phone]

## 2023-12-12 ENCOUNTER — Institutional Professional Consult (permissible substitution): Payer: Medicare Other | Admitting: Pulmonary Disease

## 2023-12-12 DIAGNOSIS — I4891 Unspecified atrial fibrillation: Secondary | ICD-10-CM | POA: Diagnosis not present

## 2023-12-12 DIAGNOSIS — J9611 Chronic respiratory failure with hypoxia: Secondary | ICD-10-CM | POA: Diagnosis not present

## 2023-12-12 DIAGNOSIS — N1831 Chronic kidney disease, stage 3a: Secondary | ICD-10-CM | POA: Diagnosis not present

## 2023-12-12 DIAGNOSIS — Z03818 Encounter for observation for suspected exposure to other biological agents ruled out: Secondary | ICD-10-CM | POA: Diagnosis not present

## 2023-12-12 DIAGNOSIS — I5022 Chronic systolic (congestive) heart failure: Secondary | ICD-10-CM | POA: Diagnosis not present

## 2023-12-12 DIAGNOSIS — R54 Age-related physical debility: Secondary | ICD-10-CM | POA: Diagnosis not present

## 2023-12-30 DIAGNOSIS — D6869 Other thrombophilia: Secondary | ICD-10-CM | POA: Diagnosis not present

## 2023-12-30 DIAGNOSIS — Z8619 Personal history of other infectious and parasitic diseases: Secondary | ICD-10-CM | POA: Diagnosis not present

## 2023-12-30 DIAGNOSIS — N1831 Chronic kidney disease, stage 3a: Secondary | ICD-10-CM | POA: Diagnosis not present

## 2023-12-30 DIAGNOSIS — Z8673 Personal history of transient ischemic attack (TIA), and cerebral infarction without residual deficits: Secondary | ICD-10-CM | POA: Diagnosis not present

## 2023-12-30 DIAGNOSIS — I5022 Chronic systolic (congestive) heart failure: Secondary | ICD-10-CM | POA: Diagnosis not present

## 2023-12-30 DIAGNOSIS — I4891 Unspecified atrial fibrillation: Secondary | ICD-10-CM | POA: Diagnosis not present

## 2024-01-15 ENCOUNTER — Ambulatory Visit: Payer: Medicare Other | Attending: Cardiovascular Disease

## 2024-01-15 DIAGNOSIS — I639 Cerebral infarction, unspecified: Secondary | ICD-10-CM

## 2024-01-15 DIAGNOSIS — Z7901 Long term (current) use of anticoagulants: Secondary | ICD-10-CM

## 2024-01-15 LAB — POCT INR: INR: 3.8 — AB (ref 2.0–3.0)

## 2024-01-15 NOTE — Patient Instructions (Signed)
Hold tonight only then Continue warfarin 1.5 tablets daily except 1 tablet on Tuesdays and Saturdays.  Recheck INR in 4 weeks.  [phone]

## 2024-01-30 ENCOUNTER — Other Ambulatory Visit: Payer: Self-pay | Admitting: Cardiovascular Disease

## 2024-02-12 ENCOUNTER — Ambulatory Visit: Payer: Medicare Other | Attending: Cardiovascular Disease

## 2024-02-12 DIAGNOSIS — I639 Cerebral infarction, unspecified: Secondary | ICD-10-CM

## 2024-02-12 DIAGNOSIS — Z7901 Long term (current) use of anticoagulants: Secondary | ICD-10-CM

## 2024-02-12 LAB — POCT INR: INR: 1.7 — AB (ref 2.0–3.0)

## 2024-02-12 NOTE — Patient Instructions (Signed)
 Take 2 tablets tonight only then  Continue warfarin 1.5 tablets daily except 1 tablet on Tuesdays and Saturdays.  Recheck INR in 3 weeks.  [phone]

## 2024-03-04 ENCOUNTER — Ambulatory Visit: Attending: Cardiovascular Disease

## 2024-03-04 DIAGNOSIS — Z7901 Long term (current) use of anticoagulants: Secondary | ICD-10-CM | POA: Diagnosis not present

## 2024-03-04 DIAGNOSIS — I639 Cerebral infarction, unspecified: Secondary | ICD-10-CM

## 2024-03-04 LAB — POCT INR: INR: 1.5 — AB (ref 2.0–3.0)

## 2024-03-04 NOTE — Patient Instructions (Signed)
 Take 2 tablets tonight only then Increase to 1.5 tablets daily. Recheck INR in 3 weeks.  [phone]

## 2024-03-18 ENCOUNTER — Other Ambulatory Visit: Payer: Self-pay

## 2024-03-18 ENCOUNTER — Encounter (HOSPITAL_BASED_OUTPATIENT_CLINIC_OR_DEPARTMENT_OTHER): Payer: Self-pay

## 2024-03-18 ENCOUNTER — Emergency Department (HOSPITAL_BASED_OUTPATIENT_CLINIC_OR_DEPARTMENT_OTHER)

## 2024-03-18 ENCOUNTER — Emergency Department (HOSPITAL_BASED_OUTPATIENT_CLINIC_OR_DEPARTMENT_OTHER)
Admission: EM | Admit: 2024-03-18 | Discharge: 2024-03-18 | Disposition: A | Discharge status: Home / Self Care | Attending: Emergency Medicine | Admitting: Emergency Medicine

## 2024-03-18 DIAGNOSIS — M79662 Pain in left lower leg: Secondary | ICD-10-CM | POA: Diagnosis not present

## 2024-03-18 DIAGNOSIS — M25472 Effusion, left ankle: Secondary | ICD-10-CM | POA: Diagnosis not present

## 2024-03-18 DIAGNOSIS — N189 Chronic kidney disease, unspecified: Secondary | ICD-10-CM | POA: Diagnosis not present

## 2024-03-18 DIAGNOSIS — M79605 Pain in left leg: Secondary | ICD-10-CM | POA: Insufficient documentation

## 2024-03-18 DIAGNOSIS — R2240 Localized swelling, mass and lump, unspecified lower limb: Secondary | ICD-10-CM | POA: Diagnosis not present

## 2024-03-18 DIAGNOSIS — R2242 Localized swelling, mass and lump, left lower limb: Secondary | ICD-10-CM | POA: Insufficient documentation

## 2024-03-18 DIAGNOSIS — I2510 Atherosclerotic heart disease of native coronary artery without angina pectoris: Secondary | ICD-10-CM | POA: Insufficient documentation

## 2024-03-18 DIAGNOSIS — M7989 Other specified soft tissue disorders: Secondary | ICD-10-CM | POA: Diagnosis not present

## 2024-03-18 DIAGNOSIS — I509 Heart failure, unspecified: Secondary | ICD-10-CM | POA: Diagnosis not present

## 2024-03-18 NOTE — ED Triage Notes (Signed)
 Pt reports L leg pain x3-4 days around calve. Pt reports L foot swelling today. Pt sent for DVT r/o. Pt takes Coumadin for previous strokes.

## 2024-03-18 NOTE — ED Provider Notes (Signed)
 [place] EMERGENCY DEPARTMENT AT [facility] Provider Note   CSN: [id number] Arrival date & time: 03/18/24  1948     History Chief Complaint  Patient presents with   Leg Pain    HPI Agostino Gorin Mcaffee is a 78 y.o. male presenting for chief complaint of leg pain and swelling. States that his left leg has been swollen today HX of CHF/HLD/CAD/CKD 1/2 at rest in severity. And 4-5 with ambulation Currently improved while laying down.  Patient's recorded medical, surgical, social, medication list and allergies were reviewed in the Snapshot window as part of the initial history.   Review of Systems   Review of Systems  Constitutional:  Negative for chills and fever.  HENT:  Negative for ear pain and sore throat.   Eyes:  Negative for pain and visual disturbance.  Respiratory:  Negative for cough and shortness of breath.   Cardiovascular:  Positive for leg swelling. Negative for chest pain and palpitations.  Gastrointestinal:  Negative for abdominal pain and vomiting.  Genitourinary:  Negative for dysuria and hematuria.  Musculoskeletal:  Negative for arthralgias and back pain.  Skin:  Negative for color change and rash.  Neurological:  Negative for seizures and syncope.  All other systems reviewed and are negative.   Physical Exam Updated Vital Signs BP (!) 152/82 (BP Location: Right Arm)   Pulse 94   Temp 97.7 F (36.5 C) (Oral)   Resp 20   Ht 5\' 8"  (1.727 m)   Wt 84.4 kg   SpO2 98%   BMI 28.28 kg/m  Physical Exam Vitals and nursing note reviewed.  Constitutional:      General: He is not in acute distress.    Appearance: He is well-developed.  HENT:     Head: Normocephalic and atraumatic.  Eyes:     Conjunctiva/sclera: Conjunctivae normal.  Cardiovascular:     Rate and Rhythm: Normal rate and regular rhythm.     Heart sounds: No murmur heard. Pulmonary:     Effort: Pulmonary effort is normal. No respiratory distress.     Breath sounds: Normal  breath sounds.  Abdominal:     Palpations: Abdomen is soft.     Tenderness: There is no abdominal tenderness.  Musculoskeletal:        General: No swelling.     Cervical back: Neck supple.  Skin:    General: Skin is warm and dry.     Capillary Refill: Capillary refill takes less than 2 seconds.  Neurological:     Mental Status: He is alert.  Psychiatric:        Mood and Affect: Mood normal.      ED Course/ Medical Decision Making/ A&P    Procedures Procedures   Medications Ordered in ED Medications - No data to display  Medical Decision Making:   Zackariah Vanderpol Wivell is a 78 y.o. male with a chief complaint of leg swelling. Denies fevers chills nausea vomiting syncope shortness of breath. Otherwise ambulatory on his leg. States that with his weight today has gradually improved. His history of present illness physicals and findings is most consistent with dependent edema given that it improved with laying down.  He may have mild heart failure exacerbation given some reported shortness of breath.  However he is no longer on Lasix.  He does endorse some dietary noncompliance with fluid quantities. He is otherwise ambulatory no acute distress.  DVT study performed to rule out acute vascular pathology and reveals no focal pathology. Patient feels  comfortable discharge.  He is on his home oxygen of 2 L and is otherwise in no acute distress at this time.  Discharged with plan to follow-up with PCP within 48 hours for repeat check to ensure gradual improvement with supportive care as educated.  Disposition:  I have considered need for hospitalization, however, considering all of the above, I believe this patient is stable for discharge at this time.  Patient/family educated about specific return precautions for given chief complaint and symptoms.  Patient/family educated about follow-up with PCP.     Patient/family expressed understanding of return precautions and need for  follow-up. Patient spoken to regarding all imaging and laboratory results and appropriate follow up for these results. All education provided in verbal form with additional information in written form. Time was allowed for answering of patient questions. Patient discharged.    Emergency Department Medication Summary:   Medications - No data to display      Clinical Impression:  1. Leg swelling      Discharge   Final Clinical Impression(s) / ED Diagnoses Final diagnoses:  Leg swelling    Rx / DC Orders ED Discharge Orders     None         Onetha Bile, MD 03/18/24 2213

## 2024-03-25 ENCOUNTER — Ambulatory Visit: Attending: Cardiovascular Disease

## 2024-03-25 DIAGNOSIS — Z7901 Long term (current) use of anticoagulants: Secondary | ICD-10-CM

## 2024-03-25 DIAGNOSIS — I639 Cerebral infarction, unspecified: Secondary | ICD-10-CM | POA: Diagnosis not present

## 2024-03-25 LAB — POCT INR: INR: 3.4 — AB (ref 2.0–3.0)

## 2024-03-25 NOTE — Patient Instructions (Signed)
 Continue 1.5 tablets daily. Recheck INR in 5 weeks.  [phone] Eat greens tonight or tomorrow.

## 2024-04-07 ENCOUNTER — Emergency Department (HOSPITAL_BASED_OUTPATIENT_CLINIC_OR_DEPARTMENT_OTHER)
Admission: EM | Admit: 2024-04-07 | Discharge: 2024-04-07 | Disposition: A | Discharge status: Home / Self Care | Attending: Emergency Medicine | Admitting: Emergency Medicine

## 2024-04-07 ENCOUNTER — Other Ambulatory Visit: Payer: Self-pay

## 2024-04-07 ENCOUNTER — Encounter (HOSPITAL_BASED_OUTPATIENT_CLINIC_OR_DEPARTMENT_OTHER): Payer: Self-pay | Admitting: Emergency Medicine

## 2024-04-07 ENCOUNTER — Emergency Department (HOSPITAL_BASED_OUTPATIENT_CLINIC_OR_DEPARTMENT_OTHER): Admitting: Radiology

## 2024-04-07 DIAGNOSIS — I4891 Unspecified atrial fibrillation: Secondary | ICD-10-CM | POA: Diagnosis not present

## 2024-04-07 DIAGNOSIS — R600 Localized edema: Secondary | ICD-10-CM | POA: Insufficient documentation

## 2024-04-07 DIAGNOSIS — R0789 Other chest pain: Secondary | ICD-10-CM | POA: Diagnosis not present

## 2024-04-07 DIAGNOSIS — J849 Interstitial pulmonary disease, unspecified: Secondary | ICD-10-CM | POA: Diagnosis not present

## 2024-04-07 DIAGNOSIS — Z7901 Long term (current) use of anticoagulants: Secondary | ICD-10-CM | POA: Diagnosis not present

## 2024-04-07 DIAGNOSIS — I771 Stricture of artery: Secondary | ICD-10-CM | POA: Diagnosis not present

## 2024-04-07 DIAGNOSIS — N189 Chronic kidney disease, unspecified: Secondary | ICD-10-CM | POA: Insufficient documentation

## 2024-04-07 DIAGNOSIS — R0602 Shortness of breath: Secondary | ICD-10-CM | POA: Diagnosis not present

## 2024-04-07 DIAGNOSIS — R791 Abnormal coagulation profile: Secondary | ICD-10-CM | POA: Diagnosis not present

## 2024-04-07 DIAGNOSIS — Z79899 Other long term (current) drug therapy: Secondary | ICD-10-CM | POA: Insufficient documentation

## 2024-04-07 DIAGNOSIS — I700 Atherosclerosis of aorta: Secondary | ICD-10-CM | POA: Diagnosis not present

## 2024-04-07 DIAGNOSIS — I509 Heart failure, unspecified: Secondary | ICD-10-CM | POA: Insufficient documentation

## 2024-04-07 LAB — BASIC METABOLIC PANEL WITH GFR
Anion gap: 11 (ref 5–15)
BUN: 21 mg/dL (ref 8–23)
CO2: 23 mmol/L (ref 22–32)
Calcium: 9.2 mg/dL (ref 8.9–10.3)
Chloride: 106 mmol/L (ref 98–111)
Creatinine, Ser: 1.23 mg/dL (ref 0.61–1.24)
GFR, Estimated: >60 mL/min (ref >60)
Glucose, Bld: 125 mg/dL — ABNORMAL HIGH (ref 70–99)
Potassium: 4.2 mmol/L (ref 3.5–5.1)
Sodium: 139 mmol/L (ref 135–145)

## 2024-04-07 LAB — PROTIME-INR
INR: 1.1 (ref 0.8–1.2)
Prothrombin Time: 14.5 s (ref 11.4–15.2)

## 2024-04-07 LAB — PRO BRAIN NATRIURETIC PEPTIDE: Pro Brain Natriuretic Peptide: 51.2 pg/mL (ref <300.0)

## 2024-04-07 LAB — CBC
HCT: 33.6 % — ABNORMAL LOW (ref 39.0–52.0)
Hemoglobin: 11 g/dL — ABNORMAL LOW (ref 13.0–17.0)
MCH: 31.3 pg (ref 26.0–34.0)
MCHC: 32.7 g/dL (ref 30.0–36.0)
MCV: 95.7 fL (ref 80.0–100.0)
Platelets: 193 10*3/uL (ref 150–400)
RBC: 3.51 MIL/uL — ABNORMAL LOW (ref 4.22–5.81)
RDW: 12.5 % (ref 11.5–15.5)
WBC: 7.9 10*3/uL (ref 4.0–10.5)
nRBC: 0 % (ref 0.0–0.2)

## 2024-04-07 LAB — D-DIMER, QUANTITATIVE: D-Dimer, Quant: 0.58 ug{FEU}/mL — ABNORMAL HIGH (ref 0.00–0.50)

## 2024-04-07 LAB — RESP PANEL BY RT-PCR (RSV, FLU A&B, COVID)  RVPGX2
Influenza A by PCR: NEGATIVE
Influenza B by PCR: NEGATIVE
Resp Syncytial Virus by PCR: NEGATIVE
SARS Coronavirus 2 by RT PCR: NEGATIVE

## 2024-04-07 LAB — TROPONIN T, HIGH SENSITIVITY
Troponin T High Sensitivity: <15 ng/L (ref <19)
Troponin T High Sensitivity: <15 ng/L (ref <19)

## 2024-04-07 MED ORDER — FUROSEMIDE 10 MG/ML IJ SOLN
20.0000 mg | Freq: Once | INTRAMUSCULAR | Status: AC
  Administered 2024-04-07: 20 mg via INTRAVENOUS
  Filled 2024-04-07: qty 2

## 2024-04-07 MED ORDER — WARFARIN SODIUM 5 MG PO TABS: 5.0000 mg | ORAL_TABLET | Freq: Once | ORAL | Status: DC

## 2024-04-07 MED ORDER — WARFARIN SODIUM 5 MG PO TABS
10.0000 mg | ORAL_TABLET | Freq: Once | ORAL | Status: AC
  Administered 2024-04-07: 10 mg via ORAL
  Filled 2024-04-07: qty 2

## 2024-04-07 NOTE — Discharge Instructions (Signed)
 You were seen in the emerged part for chest pressure Your blood work looked okay aside from a low INR which is due to your Coumadin  level being too low We gave you 10 mg here You should continue taking Coumadin  daily as previously directed and have your INR rechecked within the next week at the Coumadin  clinic Your chest x-ray and EKG looked okay You were stable on your home oxygen  Is important that you follow-up with your PCP within next week for reevaluation Return to the emergency department for chest pain trouble breathing or any other concerns

## 2024-04-07 NOTE — ED Notes (Signed)
Discharge instructions and follow up care reviewed and explained to pt who verbalized understanding and had no further questions on d/c. Pt caox4, ambulatory, NAD on d/c.

## 2024-04-07 NOTE — ED Provider Notes (Signed)
 [facility] AT [facility] Provider Note   CSN: [id number] Arrival date & time: 04/07/24  1509     History  Chief Complaint  Patient presents with   Chest Pain    Mitchell Barnes is a 78 y.o. male.  With a history of atrial fibrillation on warfarin, heart failure and CKD who presents to the ED for chest discomfort.  Beginning 2 to 3 days ago patient first experienced heaviness over his chest.  Also notes some peripheral edema in both feet.  He is prescribed Lasix  but takes this only as needed when his shortness of breath or peripheral edema increases noticeably.  Is compliant with warfarin.  No fevers productive coughing nausea vomiting or diaphoresis.  No recent falls but did bump his head on the bathroom door couple days ago.  No LOC at that time.   Chest Pain      Home Medications Prior to Admission medications   Medication Sig Start Date End Date Taking? Authorizing Provider  acetaminophen  (TYLENOL ) 650 MG CR tablet Take 650 mg by mouth every 8 (eight) hours as needed for pain or fever.    [provider]  carvedilol  (COREG ) 3.125 MG tablet TAKE 1 TABLET BY MOUTH TWICE A DAY WITH A MEAL 01/30/24   Furth, Cadence H, PA-C  diphenhydrAMINE (BENADRYL) 25 MG tablet Take 25 mg by mouth every 6 (six) hours as needed for itching.    [provider]  ferrous sulfate  325 (65 FE) MG tablet Take 325 mg by mouth every Monday, Wednesday, and Friday. Monday, Thursday    [provider]  furosemide  (LASIX ) 20 MG tablet Take 20 mg by mouth as needed.    [provider]  neomycin -polymyxin-hydrocortisone (CORTISPORIN) 3.5-10000-1 OTIC suspension Apply 1-2 drops daily after soaking and cover with bandaid 10/09/23   McDonald, Olive Better, DPM  PHENobarbital  (LUMINAL) 32.4 MG tablet Take 64.8 mg by mouth 2 (two) times daily. 04/11/20   [provider]  Potassium Chloride ER 20 MEQ TBCR Take 1 tablet by mouth daily as needed.  09/19/22   [provider]  rosuvastatin  (CRESTOR ) 20 MG tablet Take 1 tablet (20 mg total) by mouth daily. 04/19/22 05/20/23  Krishnan, Sendil K, MD  vitamin B-12 (CYANOCOBALAMIN ) 500 MCG tablet Take 500 mcg by mouth daily.    [provider]  Vitamin D , Ergocalciferol , (DRISDOL ) 1.25 MG (50000 UNIT) CAPS capsule Take 50,000 Units by mouth every Tuesday. 11/29/21   [provider]  warfarin (COUMADIN ) 5 MG tablet TAKE 1-2 TABLETS DAILY OR AS PRESCRIBED BY CLINIC 11/18/23   Gollan, Timothy J, MD      Allergies    Atorvastatin , Atorvastatin  calcium , Fluconazole, and Phenytoin    Review of Systems   Review of Systems  Cardiovascular:  Positive for chest pain.    Physical Exam Updated Vital Signs BP (!) 146/70   Pulse 72   Temp 98.8 F (37.1 C) (Oral)   Resp 14   SpO2 100%  Physical Exam Vitals and nursing note reviewed.  HENT:     Head:     Comments: Faint ecchymosis of upper right frontal region yellow in color no deformity Eyes:     Pupils: Pupils are equal, round, and reactive to light.  Cardiovascular:     Rate and Rhythm: Normal rate and regular rhythm.  Pulmonary:     Effort: Pulmonary effort is normal.     Breath sounds: Normal breath sounds.  Abdominal:     Palpations:  Abdomen is soft.     Tenderness: There is no abdominal tenderness.  Musculoskeletal:     Right lower leg: Edema present.     Left lower leg: Edema present.     Comments: Trace edema bilateral feet  Skin:    General: Skin is warm and dry.  Neurological:     Mental Status: He is alert.  Psychiatric:        Mood and Affect: Mood normal.     ED Results / Procedures / Treatments   Labs (all labs ordered are listed, but only abnormal results are displayed) Labs Reviewed  BASIC METABOLIC PANEL WITH GFR - Abnormal; Notable for the following components:      Result Value   Glucose, Bld 125 (*)    All other components within normal limits  CBC - Abnormal; Notable for the  following components:   RBC 3.51 (*)    Hemoglobin 11.0 (*)    HCT 33.6 (*)    All other components within normal limits  D-DIMER, QUANTITATIVE - Abnormal; Notable for the following components:   D-Dimer, Quant 0.58 (*)    All other components within normal limits  RESP PANEL BY RT-PCR (RSV, FLU A&B, COVID)  RVPGX2  PRO BRAIN NATRIURETIC PEPTIDE  PROTIME-INR  TROPONIN T, HIGH SENSITIVITY  TROPONIN T, HIGH SENSITIVITY    EKG EKG Interpretation Date/Time:  Tuesday Apr 07 2024 15:20:23 EDT Ventricular Rate:  97 PR Interval:  162 QRS Duration:  126 QT Interval:  364 QTC Calculation: 462 R Axis:   -49  Text Interpretation: Normal sinus rhythm Left axis deviation Right bundle branch block Minimal voltage criteria for LVH, may be normal variant ( R in aVL ) Abnormal ECG When compared with ECG of 19-Sep-2022 11:13, PREVIOUS ECG IS PRESENT Confirmed by Rafael Bun [phone]) on 04/07/2024 3:38:27 PM  Radiology DG Chest 2 View Result Date: 04/07/2024 CLINICAL DATA:  sob EXAM: CHEST - 2 VIEW COMPARISON:  February 12, 2023 FINDINGS: Fairly diffuse interstitial opacities throughout the lungs, with a peripheral and bibasilar predominance, unchanged. No focal airspace consolidation, pleural effusion, or pneumothorax. No cardiomegaly. Tortuous aorta with aortic atherosclerosis. No acute fracture or destructive lesions. Multilevel thoracic osteophytosis. IMPRESSION: Similar findings of chronic interstitial lung disease. Otherwise, no acute cardiopulmonary abnormality. Electronically Signed   By: Rance Burrows M.D.   On: 04/07/2024 16:32    Procedures Procedures    Medications Ordered in ED Medications  furosemide  (LASIX ) injection 20 mg (20 mg Intravenous Given 04/07/24 1758)  warfarin (COUMADIN ) tablet 10 mg (10 mg Oral Given 04/07/24 1800)    ED Course/ Medical Decision Making/ A&P Clinical Course as of 04/07/24 1806  Tue Apr 07, 2024  1801 Laboratory workup notable for subtherapeutic INR of  1.1.  He is followed by the Coumadin  clinic here with goal of 2-3.  Will give 10 mg warfarin here and instructed for clinic follow-up.  High sensitive troponin less than 15 not consistent with ACS.  COVID flu RSV all negative.  Chest x-ray without focal consolidation or significant pulmonary edema.  Will give a dose of Lasix  here considering increased peripheral edema.  D-dimer of 0.58 would not be considered significantly elevated for age-adjusted D-dimer cutoff.  Low suspicion for PE as cause of his shortness of breath.  Stable on home O2.  He will follow-up with Coumadin  clinic and his PCP [MP]    Clinical Course User Index [MP] Sallyanne Creamer, DO  Medical Decision Making 78 year old male with history as above presented to the ED given increased chest pressure and shortness of breath.  Wears 2 to 3 L at home has been wearing 3 more often.  Chest pressure seems to come and go.  Trace pedal edema.  Afebrile normotensive here.  Well-appearing on my exam.  Will evaluate for causes of chest pressure and shortness of breath such as ACS, dysrhythmia, pneumonia, heart failure exacerbation, viral respiratory illness.  Low suspicion for PE as he is compliant with warfarin and not tachycardic or hypoxic here.  Will obtain labs EKG chest x-ray and continue to monitor.  Will consider a dose of IV Lasix  here should there be evidence of pulmonary edema  Amount and/or Complexity of Data Reviewed Labs: ordered. Radiology: ordered.  Risk Prescription drug management.           Final Clinical Impression(s) / ED Diagnoses Final diagnoses:  Subtherapeutic international normalized ratio (INR)  Chest pressure    Rx / DC Orders ED Discharge Orders     None         Sallyanne Creamer, DO 04/07/24 1806

## 2024-04-07 NOTE — ED Notes (Signed)
Patient transported to X-Ray 

## 2024-04-07 NOTE — ED Triage Notes (Signed)
 Heavy chest tightness started 2 days ago Wears 2 L o2 at home, needing 3L Hx CHF Some swelling in ankles, minimal

## 2024-04-25 ENCOUNTER — Other Ambulatory Visit: Payer: Self-pay | Admitting: Medical

## 2024-04-29 ENCOUNTER — Ambulatory Visit: Attending: Cardiovascular Disease

## 2024-04-29 DIAGNOSIS — I639 Cerebral infarction, unspecified: Secondary | ICD-10-CM | POA: Diagnosis not present

## 2024-04-29 DIAGNOSIS — Z7901 Long term (current) use of anticoagulants: Secondary | ICD-10-CM | POA: Diagnosis not present

## 2024-04-29 LAB — POCT INR: INR: 1.2 — AB (ref 2.0–3.0)

## 2024-04-29 NOTE — Patient Instructions (Signed)
 Take 2 tablets today and Thursday then Continue 1.5 tablets daily. Recheck INR in 2 weeks.  [phone]

## 2024-04-30 DIAGNOSIS — M47814 Spondylosis without myelopathy or radiculopathy, thoracic region: Secondary | ICD-10-CM | POA: Diagnosis not present

## 2024-04-30 DIAGNOSIS — E7800 Pure hypercholesterolemia, unspecified: Secondary | ICD-10-CM | POA: Diagnosis not present

## 2024-04-30 DIAGNOSIS — R569 Unspecified convulsions: Secondary | ICD-10-CM | POA: Diagnosis not present

## 2024-04-30 DIAGNOSIS — M810 Age-related osteoporosis without current pathological fracture: Secondary | ICD-10-CM | POA: Diagnosis not present

## 2024-04-30 DIAGNOSIS — I5022 Chronic systolic (congestive) heart failure: Secondary | ICD-10-CM | POA: Diagnosis not present

## 2024-05-05 ENCOUNTER — Ambulatory Visit: Attending: Medical | Admitting: Medical

## 2024-05-05 ENCOUNTER — Encounter: Payer: Self-pay | Admitting: Medical

## 2024-05-05 VITALS — BP 124/72 | HR 88 | Ht 68.0 in | Wt 179.1 lb

## 2024-05-05 DIAGNOSIS — I4891 Unspecified atrial fibrillation: Secondary | ICD-10-CM | POA: Diagnosis not present

## 2024-05-05 DIAGNOSIS — E782 Mixed hyperlipidemia: Secondary | ICD-10-CM

## 2024-05-05 DIAGNOSIS — I77810 Thoracic aortic ectasia: Secondary | ICD-10-CM | POA: Diagnosis not present

## 2024-05-05 DIAGNOSIS — I700 Atherosclerosis of aorta: Secondary | ICD-10-CM

## 2024-05-05 DIAGNOSIS — R0602 Shortness of breath: Secondary | ICD-10-CM | POA: Diagnosis not present

## 2024-05-05 DIAGNOSIS — I10 Essential (primary) hypertension: Secondary | ICD-10-CM | POA: Diagnosis not present

## 2024-05-05 DIAGNOSIS — I4510 Unspecified right bundle-branch block: Secondary | ICD-10-CM | POA: Diagnosis not present

## 2024-05-05 DIAGNOSIS — I5042 Chronic combined systolic (congestive) and diastolic (congestive) heart failure: Secondary | ICD-10-CM

## 2024-05-05 DIAGNOSIS — I63422 Cerebral infarction due to embolism of left anterior cerebral artery: Secondary | ICD-10-CM

## 2024-05-05 DIAGNOSIS — I2510 Atherosclerotic heart disease of native coronary artery without angina pectoris: Secondary | ICD-10-CM | POA: Diagnosis not present

## 2024-05-05 NOTE — Patient Instructions (Signed)
 Medication Instructions:   Your physician recommends that you continue on your current medications as directed. Please refer to the Current Medication list given to you today.   *If you need a refill on your cardiac medications before your next appointment, please call your pharmacy*  Lab Work:  No labs ordered today  If you have labs (blood work) drawn today and your tests are completely normal, you will receive your results only by: MyChart Message (if you have MyChart) OR A paper copy in the mail If you have any lab test that is abnormal or we need to change your treatment, we will call you to review the results.  Testing/Procedures:  Your physician has requested that you have an echocardiogram. Echocardiography is a painless test that uses sound waves to create images of your heart. It provides your doctor with information about the size and shape of your heart and how well your heart's chambers and valves are working.   You may receive an ultrasound enhancing agent through an IV if needed to better visualize your heart during the echo. This procedure takes approximately one hour.  There are no restrictions for this procedure.  This will take place at [address] [facility]) [address], [place] [address]  Please note: We ask at that you not bring children with you during ultrasound (echo/ vascular) testing. Due to room size and safety concerns, children are not allowed in the ultrasound rooms during exams. Our front office staff cannot provide observation of children in our lobby area while testing is being conducted. An adult accompanying a patient to their appointment will only be allowed in the ultrasound room at the discretion of the ultrasound technician under special circumstances. We apologize for any inconvenience.   Follow-Up: At [facility], you and your health needs are our priority.  As part of our continuing mission to provide you with exceptional  heart care, our providers are all part of one team.  This team includes your primary Cardiologist (physician) and Advanced Practice Providers or APPs (Physician Assistants and Nurse Practitioners) who all work together to provide you with the care you need, when you need it.  Your next appointment:   6-8  week(s)  Provider:    Toribio Frees, PA-C

## 2024-05-05 NOTE — Progress Notes (Signed)
 Cardiology Office Note   Date:  05/05/2024  ID:  Mitchell Barnes, DOB 22-Mar-1946, MRN [id number] PCP: Arva Lathe, MD  [facility] Providers Cardiologist:  Belva Boyden, MD   History of Present Illness Mitchell Barnes is a 78 y.o. male with a hx of seizures, anemia, chronic ILD, chronic respiratory failure on 2-3L O2, hyperlipidemia, stress cardiomyopathy with improvement of LVEF, hypertension, aortic atherosclerosis with aneurysmal dilation of the aortic root measuring 4.4 cm who presents for follow-up   Patient was hospitalized in August 2021 for chest pain.  He had a negative stress test at that time.  CT attenuation images showed coronary calcification and minimal aortic atherosclerosis.  Echo in May 2021 showed LVEF 55 to 60%.   Patient was admitted in May 2023 for CVA, COVID-19, acute respiratory failure, newly reduced EF.  Echo showed EF 35 to 40%, severe global hypokinesis, unable to exclude stress cardiomyopathy.  Unable to exclude mural thrombus, this the patient was started on anticoagulation.  Patient was started on guideline directed medical therapy for CHF.  Follow-up echo in August 2023 showed improved EF 50 to 55%. He was discharged on 2L O2.    Patient was seen December 2023 was still on warfarin, with the decision to continue warfarin to minimize risk of recurrent stroke.   Patient was seen January 01, 2023 reporting heart rates in the 30s during the day.  Coreg  was stopped.  Labs were drawn and a 30-day heart monitor was ordered.Heart monitor showed predominantly sinus rhythm, bundle branch block/IV CAD present, brief runs of SVT longest lasting 20.6 seconds, minimum heart rate 46 bpm, average heart rate of 65 bpm (67bpm on the other monitor), PVC burden of 25.4%  (Pvc burden 23.7% on the other monitor). Low dose Coreg  was restarted.   The patient was last seen 05/2023 and was stable from a cardica perspective.   More recently, he went to the ER  twice with chest pain and lower leg edema. He was given Iv lasix . HS trop was normal.   Today, the patient reports three different occasions of chest pain. It was chest pressure across the lower part of the chest . He thought they were from a coughing spell. It lasted 3-5 minutes. He didn't take anything for the pain. He has chronic SOB, Breathing is the same. He is on 2-3 L. No recent fever, chills. He reports lower leg edema is stable. He sleeps in a recliner during the day.   Studies Reviewed EKG Interpretation Date/Time:  Tuesday May 05 2024 11:05:01 EDT Ventricular Rate:  88 PR Interval:  164 QRS Duration:  128 QT Interval:  388 QTC Calculation: 469 R Axis:   -47  Text Interpretation: Normal sinus rhythm Left axis deviation Right bundle branch block Minimal voltage criteria for LVH, may be normal variant ( R in aVL ) When compared with ECG of 07-Apr-2024 15:20, No significant change was found Confirmed by Gennaro Khat, Seneca Hoback ([id number]) on 05/05/2024 11:21:11 AM    Heart monitor 02/2023 Monitor 1 Normal sinus rhythm Patient had a min HR of 46 bpm, max HR of 142 bpm, and avg HR of 65 bpm.  Bundle Branch Block/IVCD was present.  11 Supraventricular Tachycardia runs occurred, the run with the fastest interval lasting 10 beats with a max rate of 140 bpm, the longest lasting 20.6 secs with an avg rate of 101 bpm. Isolated SVEs were rare (<1.0%), SVE Couplets were rare (<1.0%), and SVE Triplets were rare (<1.0%). Isolated VEs were frequent (  25.4%, [id number]), VE Couplets were  rare (<1.0%, 1049), and VE Triplets were rare (<1.0%, 117). Ventricular Bigeminy and Trigeminy were present.   Monitor 2 Normal sinus rhythm Patient had a min HR of 50 bpm, max HR of 167 bpm, and avg HR of 67 bpm.  Bundle Branch Block/IVCD was present.  4 Supraventricular Tachycardia runs occurred, the run with the fastest interval lasting 6 beats  with a max rate of 167 bpm, the longest lasting 7 beats with an avg rate of 99 bpm.  Some episodes of Supraventricular Tachycardia may be possible Atrial Tachycardia with variable block. Isolated SVEs were occasional (1.1%, 2149), SVE Couplets were rare (<1.0%, 6), and SVE Triplets were rare (<1.0%, 1). Isolated VEs were frequent (23.7%, [id number]), VE Couplets were rare (<1.0%, 188), and VE Triplets were rare (<1.0%, 1). Ventricular Bigeminy and Trigeminy were present.   No patient triggered events recorded   Signed, Juanda Noon, MD, Ph.D [facility]   Echo 07/2022  1. Left ventricular ejection fraction, by estimation, is 50 to 55%. The  left ventricle has low normal function.   2. The mitral valve is normal in structure. No evidence of mitral valve  regurgitation.   3. The aortic valve is normal in structure. Aortic valve regurgitation is  not visualized.    Echo 04/2022 1. Left ventricular ejection fraction, by estimation, is 35 to 40%. The  left ventricle has moderately decreased function. The left ventricle  demonstrates severe global hypokinesis, basal regions best preserved  (possibly consistent with stress  cardiomyopathy). The left ventricular internal cavity size was mildly  dilated. Left ventricular diastolic parameters are consistent with Grade I  diastolic dysfunction (impaired relaxation).   2. Unable to exclude mural thrombus   3. Right ventricular systolic function is normal. The right ventricular  size is normal.   4. The mitral valve is normal in structure. No evidence of mitral valve  regurgitation. No evidence of mitral stenosis.   5. The aortic valve is normal in structure. Aortic valve regurgitation is  not visualized. Aortic valve sclerosis is present, with no evidence of  aortic valve stenosis.   6. There is mild dilatation of the aortic root, measuring 40 mm.   7. The inferior vena cava is normal in size with greater than 50%  respiratory variability, suggesting right atrial pressure of 3 mmHg.    Myoview  lexiscan  2023 Narrative & Impression   Pharmacological myocardial perfusion imaging study with no significant  ischemia Normal wall motion, EF estimated at 81% No EKG changes concerning for ischemia at peak stress or in recovery. CT attenuation correction images with coronary calcification noted, minimal aortic atherosclerosis Low risk scan     Signed, Juanda Noon, MD, Ph.D [facility]      Physical Exam VS:  BP 124/72 (BP Location: Left Arm, Patient Position: Sitting, Cuff Size: Normal)   Pulse 88   Ht 5\' 8"  (1.727 m)   Wt 179 lb 2 oz (81.3 kg)   SpO2 97%   BMI 27.24 kg/m    Wt Readings from Last 3 Encounters:  05/05/24 179 lb 2 oz (81.3 kg)  03/18/24 186 lb (84.4 kg)  10/09/23 179 lb 2.1 oz (81.3 kg)    GEN: Well nourished, well developed in no acute distress NECK: No JVD; No carotid bruits CARDIAC: RRR, no murmurs, rubs, gallops RESPIRATORY:  crackles at bases ABDOMEN: Soft, non-tender, non-distended EXTREMITIES:  No edema; No deformity   ASSESSMENT AND PLAN  Chronic SOB/LLE H/o stress induced CM  Patient has chronic h/o SOB on O2. Echo in 2023 showed LVEF 50-55%, which is improved from prior EF 35-40% in 2023. He has been to the ER for chest pain and lower leg edema twice in the last few months with negative troponin, given IV lasix . PCP recommended repeat echo. He has been taking lasix  20mg  daily. He appears euvolemic on exam. He has chronic hypoxic respiratory failure since 2023. He has still not seen pulmonology. I will start with an echocardiogram. Continue lasix  20mg  daily.   Atypical chest pain Patient reports 3 episodes of chest pressure across the lower part of the chest. He thought at least 2 were related to coughing. MPI in 2021 was normal. Echo as above. Can consider Cardiac CTA.   Chronic ILD/Chronic respiratory failure on 2-3 LO2 Recommended pulmonology visit.      Dispo: Follow-up in 4-6 weeks  Signed, Marilena Trevathan Rebekah Canada, PA-C

## 2024-05-13 ENCOUNTER — Ambulatory Visit: Attending: Cardiovascular Disease

## 2024-05-13 DIAGNOSIS — Z7901 Long term (current) use of anticoagulants: Secondary | ICD-10-CM

## 2024-05-13 DIAGNOSIS — I639 Cerebral infarction, unspecified: Secondary | ICD-10-CM

## 2024-05-13 LAB — POCT INR: INR: 2.3 (ref 2.0–3.0)

## 2024-05-13 NOTE — Patient Instructions (Signed)
 Continue 1.5 tablets daily. Recheck INR in 6 weeks.  [phone]

## 2024-05-21 ENCOUNTER — Ambulatory Visit: Attending: Medical

## 2024-05-21 DIAGNOSIS — R0602 Shortness of breath: Secondary | ICD-10-CM | POA: Diagnosis not present

## 2024-05-21 LAB — ECHOCARDIOGRAM COMPLETE
AR max vel: 2.62 cm2
AV Area VTI: 2.55 cm2
AV Area mean vel: 2.51 cm2
AV Mean grad: 3 mmHg
AV Peak grad: 5.8 mmHg
Ao pk vel: 1.21 m/s
Area-P 1/2: 3.03 cm2
Calc EF: 62.1 %
S' Lateral: 2.55 cm
Single Plane A2C EF: 63.5 %
Single Plane A4C EF: 60.1 %

## 2024-05-25 ENCOUNTER — Ambulatory Visit: Payer: Self-pay | Admitting: Medical

## 2024-05-28 ENCOUNTER — Other Ambulatory Visit: Payer: Medicare Other

## 2024-06-01 DIAGNOSIS — I4891 Unspecified atrial fibrillation: Secondary | ICD-10-CM | POA: Diagnosis not present

## 2024-06-01 DIAGNOSIS — N1831 Chronic kidney disease, stage 3a: Secondary | ICD-10-CM | POA: Diagnosis not present

## 2024-06-01 DIAGNOSIS — M47814 Spondylosis without myelopathy or radiculopathy, thoracic region: Secondary | ICD-10-CM | POA: Diagnosis not present

## 2024-06-01 DIAGNOSIS — M47812 Spondylosis without myelopathy or radiculopathy, cervical region: Secondary | ICD-10-CM | POA: Diagnosis not present

## 2024-06-03 ENCOUNTER — Ambulatory Visit (HOSPITAL_BASED_OUTPATIENT_CLINIC_OR_DEPARTMENT_OTHER)
Admission: RE | Admit: 2024-06-03 | Discharge: 2024-06-03 | Disposition: A | Discharge status: Home / Self Care | Source: Ambulatory Visit | Attending: Internal Medicine | Admitting: Internal Medicine

## 2024-06-03 DIAGNOSIS — M810 Age-related osteoporosis without current pathological fracture: Secondary | ICD-10-CM | POA: Insufficient documentation

## 2024-06-04 ENCOUNTER — Other Ambulatory Visit (HOSPITAL_BASED_OUTPATIENT_CLINIC_OR_DEPARTMENT_OTHER)

## 2024-06-17 ENCOUNTER — Ambulatory Visit: Attending: Medical | Admitting: Medical

## 2024-06-17 ENCOUNTER — Encounter: Payer: Self-pay | Admitting: Medical

## 2024-06-17 VITALS — BP 130/68 | HR 84 | Ht 68.0 in | Wt 182.0 lb

## 2024-06-17 DIAGNOSIS — I513 Intracardiac thrombosis, not elsewhere classified: Secondary | ICD-10-CM | POA: Diagnosis not present

## 2024-06-17 DIAGNOSIS — I5042 Chronic combined systolic (congestive) and diastolic (congestive) heart failure: Secondary | ICD-10-CM | POA: Diagnosis not present

## 2024-06-17 DIAGNOSIS — Z8673 Personal history of transient ischemic attack (TIA), and cerebral infarction without residual deficits: Secondary | ICD-10-CM | POA: Diagnosis not present

## 2024-06-17 DIAGNOSIS — I5181 Takotsubo syndrome: Secondary | ICD-10-CM

## 2024-06-17 DIAGNOSIS — J9611 Chronic respiratory failure with hypoxia: Secondary | ICD-10-CM | POA: Diagnosis not present

## 2024-06-17 DIAGNOSIS — I2510 Atherosclerotic heart disease of native coronary artery without angina pectoris: Secondary | ICD-10-CM | POA: Diagnosis not present

## 2024-06-17 DIAGNOSIS — R079 Chest pain, unspecified: Secondary | ICD-10-CM | POA: Diagnosis not present

## 2024-06-17 DIAGNOSIS — J849 Interstitial pulmonary disease, unspecified: Secondary | ICD-10-CM

## 2024-06-17 NOTE — Progress Notes (Signed)
 Cardiology Office Note   Date:  06/17/2024  ID:  Mitchell Barnes, DOB 03-13-1946, MRN [id number] PCP: Elliot Charm, MD  [facility] Providers Cardiologist:  Evalene Lunger, MD     History of Present Illness Mitchell Barnes Vessel is a 78 y.o. male with a hx of seizures, anemia, chronic ILD, chronic respiratory failure on 2-3L O2, hyperlipidemia, stress cardiomyopathy with improvement of LVEF, hypertension, aortic atherosclerosis with aneurysmal dilation of the aortic root measuring 4.4 cm (not a surgical candidate) who presents for follow-up   Patient was hospitalized in August 2021 for chest pain.  He had a negative stress test at that time.  CT attenuation images showed coronary calcification and minimal aortic atherosclerosis.  Echo in May 2021 showed LVEF 55 to 60%.   Patient was admitted in May 2023 for CVA, COVID-19, acute respiratory failure, newly reduced EF.  Echo showed EF 35 to 40%, severe global hypokinesis, unable to exclude stress cardiomyopathy.  Unable to exclude mural thrombus, this the patient was started on anticoagulation.  Patient was started on guideline directed medical therapy for CHF.  Follow-up echo in August 2023 showed improved EF 50 to 55%. He was discharged on 2L O2.    Patient was seen December 2023 was still on warfarin, with the decision to continue warfarin to minimize risk of recurrent stroke.   Patient was seen January 01, 2023 reporting heart rates in the 30s during the day.  Coreg  was stopped.  Labs were drawn and a 30-day heart monitor was ordered.Heart monitor showed predominantly sinus rhythm, bundle branch block/IVCD present, brief runs of SVT longest lasting 20.6 seconds, minimum heart rate 46 bpm, average heart rate of 65 bpm (67bpm on the other monitor), PVC burden of 25.4%  (Pvc burden 23.7% on the other monitor). Low dose Coreg  was restarted.    In April and May 2025 he went to the ER twice with chest pain and lower leg edema.  He was given Iv lasix . HS trop was normal.The patient was seen in follow-up reporting SOB, LLE and atypical chest pain. Echo showed LVEF 55-60%, no Wma, G1DD, mild MR/AI. Cardiac PET stress was discussed, but decided to wait at that time.   Today, the patient reports chest pain that he feels is related to his lungs. He feels it has to do with coughing. He sees pulmonology next week. He feels breathing is more shallow and is unsure if this is from the O2. He is on 3L O2. No lower leg edema.   Studies Reviewed EKG Interpretation Date/Time:  Wednesday June 17 2024 10:32:21 EDT Ventricular Rate:  84 PR Interval:  164 QRS Duration:  128 QT Interval:  392 QTC Calculation: 463 R Axis:   -48  Text Interpretation: Normal sinus rhythm Left axis deviation Right bundle branch block Minimal voltage criteria for LVH, may be normal variant ( R in aVL ) When compared with ECG of 05-May-2024 11:05, No significant change was found Confirmed by Franchester, Lemya Greenwell ([id number]) on 06/17/2024 10:41:21 AM    Echo 05/2024  1. Left ventricular ejection fraction, by estimation, is 55 to 60%. The  left ventricle has normal function. The left ventricle has no regional  wall motion abnormalities. Left ventricular diastolic parameters are  consistent with Grade I diastolic  dysfunction (impaired relaxation).   2. Right ventricular systolic function is normal. The right ventricular  size is normal. Tricuspid regurgitation signal is inadequate for assessing  PA pressure.   3. The mitral valve is normal in structure.  Mild mitral valve  regurgitation. No evidence of mitral stenosis.   4. The aortic valve is tricuspid. Aortic valve regurgitation is mild.  Aortic valve sclerosis is present, with no evidence of aortic valve  stenosis.   5. The inferior vena cava is normal in size with greater than 50%  respiratory variability, suggesting right atrial pressure of 3 mmHg.   Echo 07/2022  1. Left ventricular ejection fraction, by  estimation, is 50 to 55%. The  left ventricle has low normal function.   2. The mitral valve is normal in structure. No evidence of mitral valve  regurgitation.   3. The aortic valve is normal in structure. Aortic valve regurgitation is  not visualized.    Echo 04/2022 1. Left ventricular ejection fraction, by estimation, is 35 to 40%. The  left ventricle has moderately decreased function. The left ventricle  demonstrates severe global hypokinesis, basal regions best preserved  (possibly consistent with stress  cardiomyopathy). The left ventricular internal cavity size was mildly  dilated. Left ventricular diastolic parameters are consistent with Grade I  diastolic dysfunction (impaired relaxation).   2. Unable to exclude mural thrombus   3. Right ventricular systolic function is normal. The right ventricular  size is normal.   4. The mitral valve is normal in structure. No evidence of mitral valve  regurgitation. No evidence of mitral stenosis.   5. The aortic valve is normal in structure. Aortic valve regurgitation is  not visualized. Aortic valve sclerosis is present, with no evidence of  aortic valve stenosis.   6. There is mild dilatation of the aortic root, measuring 40 mm.   7. The inferior vena cava is normal in size with greater than 50%  respiratory variability, suggesting right atrial pressure of 3 mmHg.    Myoview  lexiscan  2023 Narrative & Impression  Pharmacological myocardial perfusion imaging study with no significant  ischemia Normal wall motion, EF estimated at 81% No EKG changes concerning for ischemia at peak stress or in recovery. CT attenuation correction images with coronary calcification noted, minimal aortic atherosclerosis Low risk scan          Physical Exam VS:  BP 130/68 (BP Location: Left Arm, Patient Position: Sitting, Cuff Size: Normal)   Pulse 84   Ht 5' 8 (1.727 m)   Wt 182 lb (82.6 kg)   SpO2 90% Comment: on oxygen  at 3 liters  BMI 27.67  kg/m        Wt Readings from Last 3 Encounters:  06/17/24 182 lb (82.6 kg)  05/05/24 179 lb 2 oz (81.3 kg)  03/18/24 186 lb (84.4 kg)    GEN: Well nourished, well developed in no acute distress NECK: No JVD; No carotid bruits CARDIAC: RRR, no murmurs, rubs, gallops RESPIRATORY:  rales at bases  ABDOMEN: Soft, non-tender, non-distended EXTREMITIES:  No edema; No deformity   ASSESSMENT AND PLAN  H/o stress induced CM Patient has h/o chronic respiratory failure on 3L O2. Echo in 2023 showed LVEF 50-55%, which was improved from prior EF 35-40% in 2023. Recent echo showed showed LVEF 55-60%, G1DD, mild valve disease. He reports breathing is stable at 3LO2, he does feel he takes shallow breaths and fatigues easily. He also reports more atypical chest pain, but feels this is from his lungs. He is euvolemic on exam. Continue lasix  20mg  as needed for volume management.     Atypical chest pain Coronary artery calcifications He reports upper chest discomfort that he feels is from lungs/coughing. MPI in 2022 was  normal. Echo showed normal LVEF with no WMA. Cardiac CTA in 2023 showed stable advanced 3V coronary artery calcifications. We discussed cMRI but he would like to wait until he sees pulmonology next week, which I think is reasonable. Continue coreg  and Crestor . No ASA given warfarin.  Chronic ILD Chronic respiratory failure on 3L O2 He sees pulmonology next week.   H/o stroke and mural thrombus He suffered CVA and echo showed possible mural thrombus and he was started on warfarin. The decision to continue this was made by MD, as to further reduced risk of stroke. Continue warfarin for stroke ppx.      Dispo: Follow-up in 4 months  Signed, Briyonna Omara VEAR Fishman, PA-C

## 2024-06-17 NOTE — Patient Instructions (Signed)
 Medication Instructions:   Your physician recommends that you continue on your current medications as directed. Please refer to the Current Medication list given to you today.   *If you need a refill on your cardiac medications before your next appointment, please call your pharmacy*  Lab Work:  No labs ordered today   If you have labs (blood work) drawn today and your tests are completely normal, you will receive your results only by: MyChart Message (if you have MyChart) OR A paper copy in the mail If you have any lab test that is abnormal or we need to change your treatment, we will call you to review the results.  Testing/Procedures:  No test ordered today   Follow-Up: At [facility], you and your health needs are our priority.  As part of our continuing mission to provide you with exceptional heart care, our providers are all part of one team.  This team includes your primary Cardiologist (physician) and Advanced Practice Providers or APPs (Physician Assistants and Nurse Practitioners) who all work together to provide you with the care you need, when you need it.  Your next appointment:   4 month(s)  Provider:   You may see Timothy Gollan, MD or one of the following Advanced Practice Providers on your designated Care Team:    Cadence Adams, PA-C

## 2024-06-24 ENCOUNTER — Ambulatory Visit: Attending: Cardiovascular Disease

## 2024-06-24 ENCOUNTER — Ambulatory Visit: Admitting: Pulmonary Disease

## 2024-06-24 ENCOUNTER — Encounter: Payer: Self-pay | Admitting: Pulmonary Disease

## 2024-06-24 VITALS — BP 110/64 | HR 100 | Temp 97.3°F | Ht 68.0 in | Wt 182.6 lb

## 2024-06-24 DIAGNOSIS — I639 Cerebral infarction, unspecified: Secondary | ICD-10-CM | POA: Diagnosis not present

## 2024-06-24 DIAGNOSIS — Z7901 Long term (current) use of anticoagulants: Secondary | ICD-10-CM

## 2024-06-24 DIAGNOSIS — J849 Interstitial pulmonary disease, unspecified: Secondary | ICD-10-CM

## 2024-06-24 LAB — POCT INR: INR: 2.1 (ref 2.0–3.0)

## 2024-06-24 NOTE — Patient Instructions (Signed)
 Continue 1.5 tablets daily. Recheck INR in 6 weeks.  [phone]

## 2024-06-26 NOTE — Progress Notes (Signed)
 Synopsis: Referred in by Elliot Charm,*   Subjective:   PATIENT ID: Mitchell SAUNDERS Turan GENDER: male DOB: 1946-08-25, MRN: [id number]  Chief Complaint  Patient presents with   Consult    SOB. No wheezing. Cough.     HPI Mitchell Barnes is a pleasant 78 years old male patient with a past medical history of ILD complicated by chronic respiratory failure on 2-3L O2, hyperlipidemia, HFpEF, hypertension presenting today to the pulmonary clinic for the evaluation of ILD.   He reports that he has been having woresning shortness of breath for years associated with dry cough for years. Placed on Oxygen  in 2023 after he had COVID and was labeled as post COVID scarring.   He denies any difficult swallowing, raynaud, joint stiffness or suspicious rashes.   CTA chest in 2021 with minimal ILA.   CTA chest in 2023 with diffuse GGO consistent with COVID 19 at the time.   CT chest wo contrast 2023 subpleural reticulation with interlobular septal thickening and associated areas of GGO.   Echocardiogram 2025 - LVEF 55 to 60%, Grade I diastolic dysfunction, No significant vavlular disease.   Family history - Denies any family history of pulmonary diseases.   Social history - No signs of relevat envirnmental exposure. Never smoker. Lives at home with his wife.    ROS All systems were reviewed and are negative except for the above. Objective:   Vitals:   06/24/24 1340  BP: 110/64  Pulse: 100  Temp: (!) 97.3 F (36.3 C)  SpO2: 96%  Weight: 182 lb 9.6 oz (82.8 kg)  Height: 5' 8 (1.727 m)   96% on RA BMI Readings from Last 3 Encounters:  06/24/24 27.76 kg/m  06/17/24 27.67 kg/m  05/05/24 27.24 kg/m   Wt Readings from Last 3 Encounters:  06/24/24 182 lb 9.6 oz (82.8 kg)  06/17/24 182 lb (82.6 kg)  05/05/24 179 lb 2 oz (81.3 kg)    Physical Exam GEN: NAD, frail HEENT: Supple Neck, Reactive Pupils, EOMI  CVS: Normal S1, Normal S2, RRR, No murmurs or ES appreciated   Lungs: Babislar velcro like crackles appreciated.  Abdomen: Soft, non tender, non distended, + BS  Extremities: Warm and well perfused, No edema   Labs and imaging were reviewed.   Ancillary Information   CBC    Component Value Date/Time   WBC 7.9 04/07/2024 1527   RBC 3.51 (L) 04/07/2024 1527   HGB 11.0 (L) 04/07/2024 1527   HCT 33.6 (L) 04/07/2024 1527   PLT 193 04/07/2024 1527   MCV 95.7 04/07/2024 1527   MCH 31.3 04/07/2024 1527   MCHC 32.7 04/07/2024 1527   RDW 12.5 04/07/2024 1527   LYMPHSABS 1.8 09/19/2022 1114   MONOABS 0.8 09/19/2022 1114   EOSABS 0.0 09/19/2022 1114   BASOSABS 0.0 09/19/2022 1114        No data to display           Assessment & Plan:  Mitchell Barnes is a pleasant 78 years old male patient with a past medical history of ILD complicated by chronic respiratory failure on 2-3L O2, hyperlipidemia, HFpEF, hypertension presenting today to the pulmonary clinic for the evaluation of ILD.   Most recent CT chest is in 2023 and shows subpleural reticulations in the lower lobes and upper lobes with diffuse GGO, Indeterminate for UIP. No environemental exposure that is relevant to suggest NSIP or HP. ROS was negative for systemic symptoms to suggest CTD-ILD.   I will obtain a repeat  High res CT chest to better characterize the above findings. I will also obtain a PFT to assess lung function and degree of restriction. I will also obtain an autoimmune work up to assess for any autoimmune disease including RA that could cause the above picture.   Return in about 8 weeks (around 08/19/2024).  I spent 60 minutes caring for this patient today, including preparing to see the patient, obtaining a medical history , reviewing a separately obtained history, performing a medically appropriate examination and/or evaluation, counseling and educating the patient/family/caregiver, ordering medications, tests, or procedures, documenting clinical information in the electronic  health record, and independently interpreting results (not separately reported/billed) and communicating results to the patient/family/caregiver  Darrin Barn, MD [facility] 06/27/2024 2:34 PM

## 2024-07-02 DIAGNOSIS — M47812 Spondylosis without myelopathy or radiculopathy, cervical region: Secondary | ICD-10-CM | POA: Diagnosis not present

## 2024-07-02 DIAGNOSIS — I4891 Unspecified atrial fibrillation: Secondary | ICD-10-CM | POA: Diagnosis not present

## 2024-07-02 DIAGNOSIS — M47814 Spondylosis without myelopathy or radiculopathy, thoracic region: Secondary | ICD-10-CM | POA: Diagnosis not present

## 2024-07-02 DIAGNOSIS — N1831 Chronic kidney disease, stage 3a: Secondary | ICD-10-CM | POA: Diagnosis not present

## 2024-07-07 LAB — MYOMARKER 3 PLUS PROFILE (RDL)
Anti-Jo-1 Ab (RDL): <20 U (ref <20)
Anti-MDA-5 Ab (CADM-140)(RDL): <20 U (ref <20)
Anti-NXP-2 (P140) Ab (RDL): <20 U (ref <20)
Anti-PM/Scl-100 Ab (RDL): <20 U (ref <20)
Anti-SAE1 Ab, IgG (RDL): <20 U (ref <20)
Anti-SS-A 52kD Ab, IgG (RDL): 115 U — ABNORMAL HIGH (ref <20)
Anti-TIF-1gamma Ab (RDL): <20 U (ref <20)
Anti-U1 RNP Ab (RDL): <20 U (ref <20)

## 2024-07-07 LAB — ANA 12 PLUS PROFILE, POSITIVE
Anti-CCP Ab, IgG & IgA (RDL): <20 U (ref <20)
Anti-Cardiolipin Ab, IgA (RDL): <12 U/mL (ref <12)
Anti-Cardiolipin Ab, IgG (RDL): <15 GPL U/mL (ref <15)
Anti-Cardiolipin Ab, IgM (RDL): <13 [MPL'U]/mL (ref <13)
Anti-Centromere Ab (RDL): <1:40 {titer}
Anti-Chromatin Ab, IgG (RDL): <20 U (ref <20)
Anti-La (SS-B) Ab (RDL): <20 U (ref <20)
Anti-Ro (SS-A) Ab (RDL): 71 U — ABNORMAL HIGH (ref <20)
Anti-Scl-70 Ab (RDL): <20 U (ref <20)
Anti-Sm Ab (RDL): <20 U (ref <20)
Anti-TPO Ab (RDL): <9 [IU]/mL (ref <9.0)
Anti-dsDNA Ab by Farr(RDL): <8 [IU]/mL (ref <8.0)
C3 Complement (RDL): 216 mg/dL — ABNORMAL HIGH (ref 90–180)
C4 Complement (RDL): 31 mg/dL (ref 10–40)
Rheumatoid Factor by Turb RDL: <14 [IU]/mL (ref <14)
Speckled Pattern: 1:160 {titer} — ABNORMAL HIGH

## 2024-07-07 LAB — ANA 12 PLUS PROFILE (RDL): Anti-Nuclear Ab by IFA (RDL): POSITIVE — AB

## 2024-07-09 DIAGNOSIS — I5032 Chronic diastolic (congestive) heart failure: Secondary | ICD-10-CM | POA: Diagnosis not present

## 2024-07-09 DIAGNOSIS — R413 Other amnesia: Secondary | ICD-10-CM | POA: Diagnosis not present

## 2024-07-09 DIAGNOSIS — J9611 Chronic respiratory failure with hypoxia: Secondary | ICD-10-CM | POA: Diagnosis not present

## 2024-07-09 DIAGNOSIS — Z9981 Dependence on supplemental oxygen: Secondary | ICD-10-CM | POA: Diagnosis not present

## 2024-07-09 DIAGNOSIS — J849 Interstitial pulmonary disease, unspecified: Secondary | ICD-10-CM | POA: Diagnosis not present

## 2024-07-20 ENCOUNTER — Ambulatory Visit
Admission: RE | Admit: 2024-07-20 | Discharge: 2024-07-20 | Disposition: A | Discharge status: Home / Self Care | Source: Ambulatory Visit | Attending: Pulmonary Disease | Admitting: Pulmonary Disease

## 2024-07-20 DIAGNOSIS — J849 Interstitial pulmonary disease, unspecified: Secondary | ICD-10-CM | POA: Diagnosis not present

## 2024-07-20 DIAGNOSIS — I7121 Aneurysm of the ascending aorta, without rupture: Secondary | ICD-10-CM | POA: Diagnosis not present

## 2024-07-20 DIAGNOSIS — I700 Atherosclerosis of aorta: Secondary | ICD-10-CM | POA: Diagnosis not present

## 2024-07-20 DIAGNOSIS — J84112 Idiopathic pulmonary fibrosis: Secondary | ICD-10-CM | POA: Diagnosis not present

## 2024-08-02 DIAGNOSIS — N1831 Chronic kidney disease, stage 3a: Secondary | ICD-10-CM | POA: Diagnosis not present

## 2024-08-02 DIAGNOSIS — M47812 Spondylosis without myelopathy or radiculopathy, cervical region: Secondary | ICD-10-CM | POA: Diagnosis not present

## 2024-08-02 DIAGNOSIS — I4891 Unspecified atrial fibrillation: Secondary | ICD-10-CM | POA: Diagnosis not present

## 2024-08-02 DIAGNOSIS — M47814 Spondylosis without myelopathy or radiculopathy, thoracic region: Secondary | ICD-10-CM | POA: Diagnosis not present

## 2024-08-05 ENCOUNTER — Ambulatory Visit: Payer: Self-pay | Admitting: Pulmonary Disease

## 2024-08-05 ENCOUNTER — Ambulatory Visit: Attending: Cardiovascular Disease

## 2024-08-05 DIAGNOSIS — Z7901 Long term (current) use of anticoagulants: Secondary | ICD-10-CM | POA: Diagnosis not present

## 2024-08-05 DIAGNOSIS — I639 Cerebral infarction, unspecified: Secondary | ICD-10-CM

## 2024-08-05 LAB — POCT INR: INR: 2.2 (ref 2.0–3.0)

## 2024-08-05 NOTE — Patient Instructions (Signed)
 Continue 1.5 tablets daily. Recheck INR in 6 weeks.  [phone]

## 2024-08-19 ENCOUNTER — Encounter: Payer: Self-pay | Admitting: Pulmonary Disease

## 2024-08-19 ENCOUNTER — Ambulatory Visit (INDEPENDENT_AMBULATORY_CARE_PROVIDER_SITE_OTHER): Admitting: Pulmonary Disease

## 2024-08-19 ENCOUNTER — Ambulatory Visit: Admitting: Pulmonary Disease

## 2024-08-19 ENCOUNTER — Telehealth: Payer: Self-pay

## 2024-08-19 VITALS — BP 130/70 | HR 81 | Temp 97.3°F | Ht 68.0 in | Wt 182.6 lb

## 2024-08-19 DIAGNOSIS — J849 Interstitial pulmonary disease, unspecified: Secondary | ICD-10-CM

## 2024-08-19 MED ORDER — SULFAMETHOXAZOLE-TRIMETHOPRIM 800-160 MG PO TABS: 1.0000 | ORAL_TABLET | Freq: Every day | ORAL | 3 refills | Status: DC

## 2024-08-19 MED ORDER — PANTOPRAZOLE SODIUM 40 MG PO TBEC: 40.0000 mg | DELAYED_RELEASE_TABLET | Freq: Every day | ORAL | 6 refills | Status: AC

## 2024-08-19 MED ORDER — PREDNISONE 20 MG PO TABS: 40.0000 mg | ORAL_TABLET | Freq: Every day | ORAL | 0 refills | Status: DC

## 2024-08-19 NOTE — Progress Notes (Signed)
 Synopsis: Referred in by Malka Domino, MD   Subjective:   PATIENT ID: Mitchell Barnes Saline GENDER: male DOB: 1946/11/17, MRN: [id number]  Chief Complaint  Patient presents with   Interstitial Lung Disease    DOE. No wheezing. Cough, dry.     HPI Mitchell Barnes is a pleasant 78 years old male patient with a past medical history of ILD complicated by chronic respiratory failure on 2-3L O2, hyperlipidemia, HFpEF, hypertension presenting today to the pulmonary clinic for the evaluation of ILD.   He reports that he has been having woresning shortness of breath for years associated with dry cough for years. Placed on Oxygen  in 2023 after he had COVID and was labeled as post COVID scarring.   He denies any difficult swallowing, raynaud, joint stiffness or suspicious rashes.   CTA chest in 2021 with minimal ILA.   CTA chest in 2023 with diffuse GGO consistent with COVID 19 at the time.   CT chest wo contrast 2023 subpleural reticulation with interlobular septal thickening and associated areas of GGO.   Echocardiogram 2025 - LVEF 55 to 60%, Grade I diastolic dysfunction, No significant vavlular disease.   Family history - Denies any family history of pulmonary diseases.   Social history - No signs of relevat envirnmental exposure. Never smoker. Lives at home with his wife.    OV 08/19/2024 - [name] was seen today to discuss his Autoimmune work up and ILD. His High res CT chest with progressive GGO, subpleural reticular opacity without significant spatial distribution. Autoimmune work up consistent with Sjogren's disease with + SSA at 71 and positive SSA 52KD at 115. ANA + at 1:160. Echocardiogram negative for PH with normal LVEF and normal RV function. Overall picture consistent with sjogren's Associated ILD. Discussed need to start Prednisone  and Cell cept which he is agreeble with. I will start him on bactrim  and PPI for prophylaxis. I will also refer him to see Dr.  Dorethia and Rheumatology. We will delay starting Ofev for now but aim to start it in 3 to 6 months.   ROS All systems were reviewed and are negative except for the above. Objective:   Vitals:   08/19/24 1357  BP: 130/70  Pulse: 81  Temp: (!) 97.3 F (36.3 C)  SpO2: 91%  Weight: 182 lb 9.6 oz (82.8 kg)  Height: 5' 8 (1.727 m)   91% on RA BMI Readings from Last 3 Encounters:  08/19/24 27.76 kg/m  06/24/24 27.76 kg/m  06/17/24 27.67 kg/m   Wt Readings from Last 3 Encounters:  08/19/24 182 lb 9.6 oz (82.8 kg)  06/24/24 182 lb 9.6 oz (82.8 kg)  06/17/24 182 lb (82.6 kg)    Physical Exam GEN: NAD, frail HEENT: Supple Neck, Reactive Pupils, EOMI  CVS: Normal S1, Normal S2, RRR, No murmurs or ES appreciated  Lungs: Babislar velcro like crackles appreciated.  Abdomen: Soft, non tender, non distended, + BS  Extremities: Warm and well perfused, No edema   Labs and imaging were reviewed.   Ancillary Information   CBC    Component Value Date/Time   WBC 7.9 04/07/2024 1527   RBC 3.51 (L) 04/07/2024 1527   HGB 11.0 (L) 04/07/2024 1527   HCT 33.6 (L) 04/07/2024 1527   PLT 193 04/07/2024 1527   MCV 95.7 04/07/2024 1527   MCH 31.3 04/07/2024 1527   MCHC 32.7 04/07/2024 1527   RDW 12.5 04/07/2024 1527   LYMPHSABS 1.8 09/19/2022 1114   MONOABS 0.8 09/19/2022 1114  EOSABS 0.0 09/19/2022 1114   BASOSABS 0.0 09/19/2022 1114        No data to display           Assessment & Plan:  Mitchell Barnes is a pleasant 78 years old male patient with a past medical history of ILD complicated by chronic respiratory failure on 2-3L O2, hyperlipidemia, HFpEF, hypertension presenting today to the pulmonary clinic for the evaluation of ILD.   Most recent CT chest is in 2023 and shows subpleural reticulations in the lower lobes and upper lobes with diffuse GGO, Indeterminate for UIP. No environemental exposure that is relevant to suggest NSIP or HP. ROS was negative for systemic  symptoms to suggest CTD-ILD.    His High res CT chest with progressive GGO, subpleural reticular opacity without significant spatial distribution. Autoimmune work up consistent with Sjogren's disease with + SSA at 71 and positive SSA 52KD at 115. ANA + at 1:160. Echocardiogram negative for PH with normal LVEF and normal RV function. Overall picture consistent with sjogren's Associated ILD.  []  Start Prednisone  40mg  PO Daily for 4 weeks and at that time assess for taper.  []  Start Cellcept ( Will ask pharmacy for help with navigating this)  []  Start bactrim  and PPI for prophylaxis.  []  Refer to ILD  conference and Dr. Dorethia.  []  Refer to rheumatology clinic.   Return in about 4 weeks (around 09/16/2024).  I spent 60 minutes caring for this patient today, including preparing to see the patient, obtaining a medical history , reviewing a separately obtained history, performing a medically appropriate examination and/or evaluation, counseling and educating the patient/family/caregiver, ordering medications, tests, or procedures, documenting clinical information in the electronic health record, and independently interpreting results (not separately reported/billed) and communicating results to the patient/family/caregiver  Darrin Barn, MD [facility] 08/19/2024 5:38 PM

## 2024-08-19 NOTE — Progress Notes (Signed)
 PFT not done due to patient not being able to come off oxygen . Pt O2 on room air was 84% and when O2 was placed back on pt it was 92%. MD aware.

## 2024-08-19 NOTE — Progress Notes (Deleted)
 PFT not done due to patient not being able to come off oxygen . Pt O2 on room air was 84% and when O2 was placed back on pt it was 92%. MD aware.

## 2024-08-19 NOTE — Telephone Encounter (Signed)
 Pharmacy team consulted to review potential drug interactions associated with new start medications after OV with Dr. Malka today.   Pulmonary team looking to start prednisone , pantoprazole , Ofev, and Bactrim  in patient currently taking phenobarbital  (PMH seizures) and warfarin (PMH CVA, atrial fibrillation).   Drug interactions listed below with potential management strategies:  - Bactrim  DS & warfarin: Combination may result in increased INR. Given thrice-weekly dosing of Bactrim  DS for PJP prophylaxis, may be difficult to adjust warfarin regimen to consistently achieve therapeutic INR.  - Potential management strategy: Consider atovaquone as alternative to Bactrim  DS for PJP prophylaxis while on high dose prednisone . Check LFTs at baseline. Manufacturer assistance program available. Pharmacy team available to assist with access if needed.  - Ofev & warfarin: Combination may increase risk of bleeding.  - Potential management strategy: Monitor closely for signs and symptoms of bleeding and adjust therapy as needed. Monitor CBC.   - Phenobarbital  & prednisone : Phenobarbital  may decrease the concentration of prednisone .  - Potential management strategy: Monitor for decreased effects of prednisone . Increase prednisone  dose if needed.  - Prednisone  & warfarin: Increased anticoagulant effects of warfarin.  - Potential management strategy: Recommend close INR monitoring and warfarin dose adjustment as indicated. Last INR check was 08/05/24, INR therapeutic. Recommend close follow-up to recheck INR after initiation of prednisone  to assess need for warfarin dose adjustment. PharmD messaged cardiology team to assist with coordination.  Of note, phenobarbital  is associated with several drug interactions. Patient's other medications are stable and he has been taking phenobarbital  for a while. Drug interaction review today was focused on drug interactions associated with new start medications - prednisone ,  pantoprazole , Ofev, and Bactrim .   PLAN:  - Pharmacy team to work on atovaquone access. Will need to first obtain LFTs to ensure wnl prior to starting atovaquone for PJP prophylaxis. Patient will not start Bactrim  due to drug interaction with warfarin.  - Cardiology clinic reached out to patient to schedule INR check in one week and preemptive dose reduction due to new start high dose prednisone .   Called patient to discuss above plan. Counseled on increased bleed risk with potential warfarin interactions. Warfarin is monitored by cardiology clinic and he confirms he has INR check scheduled in 1 week. He verbalizes understanding and agreement with plan.   Aleck Puls, PharmD, BCPS Clinical Pharmacist  [facility]

## 2024-08-20 ENCOUNTER — Telehealth: Payer: Self-pay

## 2024-08-20 ENCOUNTER — Other Ambulatory Visit (HOSPITAL_COMMUNITY): Payer: Self-pay

## 2024-08-20 DIAGNOSIS — Z79899 Other long term (current) drug therapy: Secondary | ICD-10-CM

## 2024-08-20 DIAGNOSIS — J849 Interstitial pulmonary disease, unspecified: Secondary | ICD-10-CM

## 2024-08-20 DIAGNOSIS — Z2989 Encounter for other specified prophylactic measures: Secondary | ICD-10-CM

## 2024-08-20 DIAGNOSIS — Z5181 Encounter for therapeutic drug level monitoring: Secondary | ICD-10-CM

## 2024-08-20 NOTE — Telephone Encounter (Signed)
 Referral to pharmacy team for new start CellCept (see Dr. Nelda OV note 08/19/24).   Called patient as labs are due prior to starting therapy. Patient plans to go to [facility] on 09/03/24 to obtain labs. Pending lab results, plan to start CellCept therapy.  Aleck Puls, PharmD, BCPS Clinical Pharmacist  [facility]

## 2024-08-20 NOTE — Telephone Encounter (Signed)
 Referral to start atovaquone for PJP prophylaxis per staff message with Dr. Malka.   Test claim generates significant copay. Pharmacy team will begin BIV.   Aleck Puls, PharmD, BCPS Clinical Pharmacist  [facility]

## 2024-08-20 NOTE — Telephone Encounter (Signed)
 Called patient to ensure he did not start Bactrim . Patient reports he picked this up yesterday from the pharmacy. Advised patient to not start taking Bactrim  and our team will follow-up with him regarding best option for PJP prophylaxis.   Discussed with Dr. Malka via staff message. Plan to move forward with atovaquone.  Referral to pharmacy team for new start atovaquone - will start BIV in new thread as test claim generates high copay.  Aleck Puls, PharmD, BCPS Clinical Pharmacist  [facility]

## 2024-08-25 NOTE — Telephone Encounter (Signed)
 Unable to start Mepron (atovaquone) for PJP prophylaxis due to cost barriers.   Remaining options for PJP prophylaxis while on CellCept and prednisone  include:  - Bactrim  DS three times weekly on Mon, Wed, Fri - Dapsone 100mg  once daily   Both options have drug interactions with warfarin. May be difficult to adjust warfarin regimen for consistent INR with use of three times weekly Bactrim  DS. Dapsone interaction with warfarin is documented in case report; may increase INR, bleeding risk.   Looping in cardiology team where warfarin is monitored and Dr. Malka for awareness and input.   Aleck Puls, PharmD, BCPS Clinical Pharmacist  [facility]

## 2024-08-25 NOTE — Telephone Encounter (Signed)
 Started application for patient assistance for Mepron through [employer]. Spoke to the pt who states he does not believe he has spent $600 this year on prescriptions so far which is one of the requirements. He says he spends about $20/month. Forwarding to pharmacist for next steps.

## 2024-08-26 ENCOUNTER — Ambulatory Visit: Attending: Cardiovascular Disease

## 2024-08-26 DIAGNOSIS — Z7901 Long term (current) use of anticoagulants: Secondary | ICD-10-CM | POA: Insufficient documentation

## 2024-08-26 DIAGNOSIS — I639 Cerebral infarction, unspecified: Secondary | ICD-10-CM

## 2024-08-26 LAB — POCT INR: INR: 1 — AB (ref 2.0–3.0)

## 2024-08-26 NOTE — Patient Instructions (Signed)
 Take 2.5 tablets today only then decrease to  1 tablet daily, except 1.5 tablets Mondays, Wednesdays and Fridays.  Recheck INR in 2 weeks.  [phone] Prednisone  40 mg daily 9/17-10/27.

## 2024-08-31 NOTE — Telephone Encounter (Signed)
 PharmD will alert Coumadin  clinic when Bactrim  DS for PJP prophylaxis on CellCept is started.   Tentative plan to start CellCept pending lab results - he plans to get baseline labs drawn 09/03/24.   Aleck Puls, PharmD, BCPS Clinical Pharmacist  [facility]

## 2024-09-01 DIAGNOSIS — N1831 Chronic kidney disease, stage 3a: Secondary | ICD-10-CM | POA: Diagnosis not present

## 2024-09-01 DIAGNOSIS — I4891 Unspecified atrial fibrillation: Secondary | ICD-10-CM | POA: Diagnosis not present

## 2024-09-01 DIAGNOSIS — M47814 Spondylosis without myelopathy or radiculopathy, thoracic region: Secondary | ICD-10-CM | POA: Diagnosis not present

## 2024-09-01 DIAGNOSIS — M47812 Spondylosis without myelopathy or radiculopathy, cervical region: Secondary | ICD-10-CM | POA: Diagnosis not present

## 2024-09-04 ENCOUNTER — Other Ambulatory Visit: Payer: Self-pay | Admitting: Cardiovascular Disease

## 2024-09-04 DIAGNOSIS — I4891 Unspecified atrial fibrillation: Secondary | ICD-10-CM

## 2024-09-08 NOTE — Addendum Note (Signed)
 Addended by: Adrien Shankar L on: 09/08/2024 03:55 PM   Modules accepted: Orders

## 2024-09-08 NOTE — Telephone Encounter (Signed)
 Called patient - he has not gone to lab for baseline CellCept labs.   He has INR check with [facility] tomorrow. Plan to get baseline labs drawn after his INR check tomorrow. Messaged nurse at [facility], confirmed patient can go to lab after INR check as walk-in.   Pending G6PD result, can safely start Bactrim  DS for pneumocystis prophylaxis while on high dose prednisone .   Patient verbalizes understanding and agreement with plan.   Aleck Puls, PharmD, BCPS, CPP Clinical Pharmacist  [facility]

## 2024-09-09 ENCOUNTER — Other Ambulatory Visit: Payer: Self-pay

## 2024-09-09 ENCOUNTER — Ambulatory Visit: Attending: Cardiovascular Disease

## 2024-09-09 DIAGNOSIS — Z7901 Long term (current) use of anticoagulants: Secondary | ICD-10-CM

## 2024-09-09 DIAGNOSIS — I639 Cerebral infarction, unspecified: Secondary | ICD-10-CM

## 2024-09-09 DIAGNOSIS — Z5181 Encounter for therapeutic drug level monitoring: Secondary | ICD-10-CM | POA: Diagnosis not present

## 2024-09-09 DIAGNOSIS — Z79899 Other long term (current) drug therapy: Secondary | ICD-10-CM | POA: Diagnosis not present

## 2024-09-09 LAB — POCT INR: INR: 1.5 — AB (ref 2.0–3.0)

## 2024-09-09 NOTE — Patient Instructions (Signed)
 Take 2.5 tablets today only then continue  1 tablet daily, except 1.5 tablets Mondays, Wednesdays and Fridays.  Recheck INR in 1 weeks.  [phone] Prednisone  40 mg daily 9/17-10/27.

## 2024-09-11 NOTE — Telephone Encounter (Signed)
 Delay in starting CellCept - called patient on 09/08/24 (see separate telephone thread on 08/20/24). He did not go to lab for baseline labs on 09/03/24.   Baseline labs drawn 09/09/24. CBC, CMP stable.  HIV neg. HepB and HepC labs neg.  TB result pending. - Pending result, can start CellCept.   G6PD result pending - Pending result, can start Bactrim  for pneumocystis prophylaxis. Of note, patient taking prednisone  40mg  daily for ~3 weeks.   Next OV with Dr. Malka 09/16/24.   Aleck Puls, PharmD, BCPS, CPP Clinical Pharmacist  [facility]

## 2024-09-13 LAB — CBC WITH DIFFERENTIAL/PLATELET
Basophils Absolute: 0.1 x10E3/uL (ref 0.0–0.2)
Basos: 1 %
EOS (ABSOLUTE): 0.2 x10E3/uL (ref 0.0–0.4)
Eos: 2 %
Hematocrit: 33.9 % — ABNORMAL LOW (ref 37.5–51.0)
Hemoglobin: 11.1 g/dL — ABNORMAL LOW (ref 13.0–17.7)
Immature Grans (Abs): 0.1 x10E3/uL (ref 0.0–0.1)
Immature Granulocytes: 1 %
Lymphocytes Absolute: 2.7 x10E3/uL (ref 0.7–3.1)
Lymphs: 27 %
MCH: 31.2 pg (ref 26.6–33.0)
MCHC: 32.7 g/dL (ref 31.5–35.7)
MCV: 95 fL (ref 79–97)
Monocytes Absolute: 0.7 x10E3/uL (ref 0.1–0.9)
Monocytes: 7 %
Neutrophils Absolute: 6.4 x10E3/uL (ref 1.4–7.0)
Neutrophils: 62 %
Platelets: 221 x10E3/uL (ref 150–450)
RBC: 3.56 x10E6/uL — ABNORMAL LOW (ref 4.14–5.80)
RDW: 12.2 % (ref 11.6–15.4)
WBC: 10.1 x10E3/uL (ref 3.4–10.8)

## 2024-09-13 LAB — COMPREHENSIVE METABOLIC PANEL WITH GFR
ALT: 20 IU/L (ref 0–44)
AST: 24 IU/L (ref 0–40)
Albumin: 3.8 g/dL (ref 3.8–4.8)
Alkaline Phosphatase: 68 IU/L (ref 47–123)
BUN/Creatinine Ratio: 18 (ref 10–24)
BUN: 23 mg/dL (ref 8–27)
Bilirubin Total: <0.2 mg/dL (ref 0.0–1.2)
CO2: 23 mmol/L (ref 20–29)
Calcium: 8.9 mg/dL (ref 8.6–10.2)
Chloride: 108 mmol/L — ABNORMAL HIGH (ref 96–106)
Creatinine, Ser: 1.27 mg/dL (ref 0.76–1.27)
Globulin, Total: 2.8 g/dL (ref 1.5–4.5)
Glucose: 102 mg/dL — ABNORMAL HIGH (ref 70–99)
Potassium: 4.5 mmol/L (ref 3.5–5.2)
Sodium: 144 mmol/L (ref 134–144)
Total Protein: 6.6 g/dL (ref 6.0–8.5)
eGFR: 58 mL/min/1.73 — ABNORMAL LOW (ref >59)

## 2024-09-13 LAB — HEPATITIS B SURFACE ANTIGEN: Hepatitis B Surface Ag: NEGATIVE

## 2024-09-13 LAB — QUANTIFERON-TB GOLD PLUS
QuantiFERON Mitogen Value: 2.05 [IU]/mL
QuantiFERON Nil Value: 0.03 [IU]/mL
QuantiFERON TB1 Ag Value: 0.07 [IU]/mL
QuantiFERON TB2 Ag Value: 0.04 [IU]/mL

## 2024-09-13 LAB — GLUCOSE 6 PHOSPHATE DEHYDROGENASE: G-6-PD, Quant: 9.6 U/g{Hb} (ref 4.8–15.7)

## 2024-09-13 LAB — HEPATITIS B CORE ANTIBODY, IGM: Hep B C IgM: NEGATIVE

## 2024-09-13 LAB — HIV ANTIBODY (ROUTINE TESTING W REFLEX): HIV Screen 4th Generation wRfx: NONREACTIVE

## 2024-09-13 LAB — HEPATITIS C ANTIBODY: Hep C Virus Ab: NONREACTIVE

## 2024-09-14 NOTE — Telephone Encounter (Signed)
 Labs resulted - ATC patient to discuss new start CellCept.   Called patient and he requested call back in 10 minutes. Called back, unable to reach patient - LVM with contact number to return call.   Purpose of call to discuss CellCept new start education and to start Bactrim  DS. He is due for INR check on 09/16/24, same day as next OV with Dr. Malka.   Aleck Puls, PharmD, BCPS, CPP Clinical Pharmacist  [facility]

## 2024-09-15 ENCOUNTER — Telehealth: Payer: Self-pay

## 2024-09-15 MED ORDER — MYCOPHENOLATE MOFETIL 500 MG PO TABS: ORAL_TABLET | ORAL | 0 refills | Status: DC

## 2024-09-15 MED ORDER — SULFAMETHOXAZOLE-TRIMETHOPRIM 800-160 MG PO TABS: 1.0000 | ORAL_TABLET | ORAL | 3 refills | Status: AC

## 2024-09-15 NOTE — Telephone Encounter (Unsigned)
 Copied from CRM [id number]. Topic: General - Other >> Sep 14, 2024  4:29 PM Joesph PARAS wrote: Reason for CRM: Patient is returning call to pharmacist. Please return call to patient.

## 2024-09-15 NOTE — Addendum Note (Signed)
 Addended by: Sallyanne Birkhead L on: 09/15/2024 09:29 AM   Modules accepted: Orders

## 2024-09-15 NOTE — Telephone Encounter (Signed)
 Spoke with patient and spouse regarding CellCept new start and Bactrim  DS for pneumocystis prophylaxis.   HIV, HepB, HepC, Quantiferon TB Gold - neg (09/09/24) CBC with diff stable from previous (09/09/24) CMP stable from previous (09/09/24) G6PD wnl (09/09/24)  Patient and spouse were counseled on the purpose, proper use, and adverse effects of mycophenolate including risk of infection, new or reactivation of viral infections, nausea, and headaches. Given effects of food on bioavailability are minor, may administer with or without food. Due to GI-related side effects, may be best taken with food.   Discussed ways to prevent infections. Avoid sick contacts. Wear a mask if you feel appropriate and if in crowded areas, such as airports. We reviewed that immunizations are also an important part of infection prevention. However, he is currently taking prednisone  40mg  daily, so would not recommend getting immunization while on high dose steroids. If steroid dose is tapered in the future, can revisit immunizations at that time with pulmonary team. Do not use live attenuated vaccines while taking CellCept.  CellCept is an immunosuppressive agent, so you should HOLD CellCept during an active infection, if you are prescribed antibiotics, if you have a fever, and if you have a scheduled procedure/surgery.  Discussed warning of increased risk of certain malignancies, particularly of the skin.    Discussed risk of neutropenia and discussed importance of regular labs to monitor blood counts, liver function, and kidney function. Counseled patient on importance of taking PCP prophylaxis while on mycophenolate. Counseled patient on purpose and proper use of sulfamethoxazole /trimethoprim  three times a week. G6PD wnl 09/09/24.  Reviewed medication list for drug interactions. Drug interactions listed below with risk level D Consider therapy modification -  - Warfarin & phenobarbital  - patient has been on both agents for  a while and has been monitored closely by Coumadin  Clinic. - Warfarin & Bactrim  DS - this has been discussed with Coumadin  Clinic and Dr. Malka. Patient will be monitored closely while starting Bactrim  DS. Patient and spouse were counseled on increased risk of bleeding.   Patient was counseled to separate administration of PPI and CellCept by a few hours to avoid impact on CellCept absorption.  PLAN:  - START CellCept 500mg  BID and obtain labs (CBC/CMP) in 2 weeks. Will try to coordinate timing of lab draw with INR check. Titration of CellCept pending lab results.  - START Bactrim  DS three times weekly (Mon, Wed, Fri). - INR check tomorrow (09/16/24) - PharmD will allert Coumadin  Clinic of these medication changes as planned.  - OV with Dr. Malka tomorrow (09/16/24) - Provided PharmD contact information: [phone]

## 2024-09-16 ENCOUNTER — Ambulatory Visit: Attending: Cardiovascular Disease

## 2024-09-16 ENCOUNTER — Encounter: Payer: Self-pay | Admitting: Pulmonary Disease

## 2024-09-16 ENCOUNTER — Ambulatory Visit: Admitting: Pulmonary Disease

## 2024-09-16 VITALS — BP 130/78 | HR 84 | Temp 97.1°F | Ht 68.0 in | Wt 188.6 lb

## 2024-09-16 DIAGNOSIS — J849 Interstitial pulmonary disease, unspecified: Secondary | ICD-10-CM | POA: Diagnosis not present

## 2024-09-16 DIAGNOSIS — I639 Cerebral infarction, unspecified: Secondary | ICD-10-CM | POA: Diagnosis not present

## 2024-09-16 DIAGNOSIS — Z7901 Long term (current) use of anticoagulants: Secondary | ICD-10-CM

## 2024-09-16 LAB — POCT INR: INR: 1.7 — AB (ref 2.0–3.0)

## 2024-09-16 NOTE — Progress Notes (Signed)
 Synopsis: Referred in by Elliot Charm,*   Subjective:   PATIENT ID: Mitchell Barnes Coon GENDER: male DOB: [birth year], MRN: [id number]  Chief Complaint  Patient presents with   Interstitial Lung Disease    Increased SOB. No wheezing. Some cough.  Taking Cellcept and Prednisone .    HPI Mitchell Barnes is a pleasant 78 years old male patient with a past medical history of ILD complicated by chronic respiratory failure on 2-3L O2, hyperlipidemia, HFpEF, hypertension presenting today to the pulmonary clinic for the evaluation of ILD.   He reports that he has been having woresning shortness of breath for years associated with dry cough for years. Placed on Oxygen  in 2023 after he had COVID and was labeled as post COVID scarring.   He denies any difficult swallowing, raynaud, joint stiffness or suspicious rashes.   CTA chest in 2021 with minimal ILA.   CTA chest in 2023 with diffuse GGO consistent with COVID 19 at the time.   CT chest wo contrast 2023 subpleural reticulation with interlobular septal thickening and associated areas of GGO.   Echocardiogram 2025 - LVEF 55 to 60%, Grade I diastolic dysfunction, No significant vavlular disease.   Family history - Denies any family history of pulmonary diseases.   Social history - No signs of relevat envirnmental exposure. Never smoker. Lives at home with his wife.    OV 08/19/2024 - Mitchell Barnes was seen today to discuss his Autoimmune work up and ILD. His High res CT chest with progressive GGO, subpleural reticular opacity without significant spatial distribution. Autoimmune work up consistent with Sjogren's disease with + SSA at 71 and positive SSA 52KD at 115. ANA + at 1:160. Echocardiogram negative for PH with normal LVEF and normal RV function. Overall picture consistent with sjogren's Associated ILD. Discussed need to start Prednisone  and Cell cept which he is agreeble with. I will start him on bactrim  and PPI for  prophylaxis. I will also refer him to see Dr. Dorethia and Rheumatology. We will delay starting Ofev for now but aim to start it in 3 to 6 months.   OV 09/16/2024 - [name] is here for Sjogren's Associated ILD. He was started on prednisone  40mg  09/17. He is feeling slightly better but still has episodes where he feels sudden onset of shortness of breath. HE is supposed to start Cellcept today. He is not taking PPI nor Bactrim . I went over his medications with him and emphasized on the importance of taking bactrim  as well as PPI for prophylaxis. We will start tapering his prednisone  by 10mg  every 2 weeks and sustain at 10mg  until I see him again in January. I also emphasized on the importance of undergoing pulmonary rehab which he is agreeable with. I will plan to repeat a ct chest in 6 months and try for PFTs during that time as well.   ROS All systems were reviewed and are negative except for the above. Objective:   Vitals:   09/16/24 1058  BP: 130/78  Pulse: 84  Temp: (!) 97.1 F (36.2 C)  SpO2: 94%  Weight: 188 lb 9.6 oz (85.5 kg)  Height: 5' 8 (1.727 m)   94% on RA BMI Readings from Last 3 Encounters:  09/16/24 28.68 kg/m  08/19/24 27.76 kg/m  06/24/24 27.76 kg/m   Wt Readings from Last 3 Encounters:  09/16/24 188 lb 9.6 oz (85.5 kg)  08/19/24 182 lb 9.6 oz (82.8 kg)  06/24/24 182 lb 9.6 oz (82.8 kg)    Physical  Exam GEN: NAD, frail HEENT: Supple Neck, Reactive Pupils, EOMI  CVS: Normal S1, Normal S2, RRR, No murmurs or ES appreciated  Lungs: Babislar velcro like crackles appreciated.  Abdomen: Soft, non tender, non distended, + BS  Extremities: Warm and well perfused, No edema   Labs and imaging were reviewed.   Ancillary Information   CBC    Component Value Date/Time   WBC 10.1 09/09/2024 1059   WBC 7.9 04/07/2024 1527   RBC 3.56 (L) 09/09/2024 1059   RBC 3.51 (L) 04/07/2024 1527   HGB 11.1 (L) 09/09/2024 1059   HCT 33.9 (L) 09/09/2024 1059   PLT 221  09/09/2024 1059   MCV 95 09/09/2024 1059   MCH 31.2 09/09/2024 1059   MCH 31.3 04/07/2024 1527   MCHC 32.7 09/09/2024 1059   MCHC 32.7 04/07/2024 1527   RDW 12.2 09/09/2024 1059   LYMPHSABS 2.7 09/09/2024 1059   MONOABS 0.8 09/19/2022 1114   EOSABS 0.2 09/09/2024 1059   BASOSABS 0.1 09/09/2024 1059        No data to display           Assessment & Plan:  Mitchell Barnes is a pleasant 78 years old male patient with a past medical history of ILD complicated by chronic respiratory failure on 2-3L O2, hyperlipidemia, HFpEF, hypertension presenting today to the pulmonary clinic for the evaluation of ILD.   Most recent CT chest is in 2023 and shows subpleural reticulations in the lower lobes and upper lobes with diffuse GGO, Indeterminate for UIP. No environemental exposure that is relevant to suggest NSIP or HP. ROS was negative for systemic symptoms to suggest CTD-ILD.    His High res CT chest with progressive GGO, subpleural reticular opacity without significant spatial distribution. Autoimmune work up consistent with Sjogren's disease with + SSA at 71 and positive SSA 52KD at 115. ANA + at 1:160. Echocardiogram negative for PH with normal LVEF and normal RV function. Overall picture consistent with sjogren's Associated ILD.  []  Start Prednisone  taper as above.  []  Start Cellcept 500 mg 1 tab bid today for 2 weeks and then increase to 2 tablet bid.  []  Start bactrim  and PPI for prophylaxis.  []  Referral to pulmonary rehab placed.  []  Plan to repeat HRCT and PFTs in 6 months.  []  Will reconsider Ofev in 3 months.   I personally spent a total of 40 minutes in the care of the patient today including preparing to see the patient, getting/reviewing separately obtained history, performing a medically appropriate exam/evaluation, counseling and educating, placing orders, documenting clinical information in the EHR, independently interpreting results, and communicating results.   Darrin Barn, MD [facility] 09/16/2024 12:49 PM ,

## 2024-09-16 NOTE — Patient Instructions (Signed)
 Bactrim  M-W-F; Decrease to 1 tablet Daily.  Recheck INR in 1 week.  [phone] Prednisone  Taper

## 2024-09-16 NOTE — Telephone Encounter (Signed)
 Resolved in separate thread

## 2024-09-18 ENCOUNTER — Telehealth: Payer: Self-pay

## 2024-09-18 NOTE — Telephone Encounter (Signed)
 Spouse left two VM regarding medication needs. Returning call.   Reports she was organizing patient's medications and found a pill bottle without a label and she wasn't sure what medication it was. It was carvedilol , which she discovered after talking to the pharmacy.   She reports medication problems are resolved now and medications are organized in pillboxes for new start CellCept + Bactrim .

## 2024-09-23 ENCOUNTER — Ambulatory Visit: Attending: Cardiovascular Disease

## 2024-09-23 DIAGNOSIS — I639 Cerebral infarction, unspecified: Secondary | ICD-10-CM | POA: Diagnosis not present

## 2024-09-23 DIAGNOSIS — Z7901 Long term (current) use of anticoagulants: Secondary | ICD-10-CM

## 2024-09-23 LAB — POCT INR: INR: 1.8 — AB (ref 2.0–3.0)

## 2024-09-23 NOTE — Patient Instructions (Signed)
 Increase to 1 tablet Daily, except 1.5 tablets every Wednesday.  Recheck INR in 2 weeks.  [phone]

## 2024-09-25 ENCOUNTER — Other Ambulatory Visit: Payer: Self-pay | Admitting: Pulmonary Disease

## 2024-09-25 ENCOUNTER — Encounter: Attending: Pulmonary Disease | Admitting: *Deleted

## 2024-09-25 DIAGNOSIS — I509 Heart failure, unspecified: Secondary | ICD-10-CM | POA: Insufficient documentation

## 2024-09-25 DIAGNOSIS — J8410 Pulmonary fibrosis, unspecified: Secondary | ICD-10-CM

## 2024-09-25 DIAGNOSIS — E7800 Pure hypercholesterolemia, unspecified: Secondary | ICD-10-CM | POA: Insufficient documentation

## 2024-09-25 DIAGNOSIS — J849 Interstitial pulmonary disease, unspecified: Secondary | ICD-10-CM | POA: Insufficient documentation

## 2024-09-25 NOTE — Addendum Note (Signed)
 Addended by: Herberta Pickron L on: 09/25/2024 09:46 AM   Modules accepted: Orders

## 2024-09-25 NOTE — Progress Notes (Signed)
 Initial phone call completed. Diagnosis can be found in [facility] 9/17. EP Orientation scheduled for Thursday 10/30 at 2pm.

## 2024-09-29 NOTE — Addendum Note (Signed)
 Addended by: Lacey Wallman L on: 09/29/2024 09:30 AM   Modules accepted: Orders

## 2024-10-01 ENCOUNTER — Encounter

## 2024-10-01 VITALS — Ht 66.14 in | Wt 186.4 lb

## 2024-10-01 DIAGNOSIS — J849 Interstitial pulmonary disease, unspecified: Secondary | ICD-10-CM | POA: Diagnosis present

## 2024-10-01 DIAGNOSIS — J8410 Pulmonary fibrosis, unspecified: Secondary | ICD-10-CM

## 2024-10-01 NOTE — Patient Instructions (Signed)
 Patient Instructions  Patient Details  Name: Mitchell Barnes MRN: [id number] Date of Birth: [birth year] Referring Provider:  Malka Domino, MD  Below are your personal goals for exercise, nutrition, and risk factors. Our goal is to help you stay on track towards obtaining and maintaining these goals. We will be discussing your progress on these goals with you throughout the program.  Initial Exercise Prescription:  Initial Exercise Prescription - 10/01/24 1500       Date of Initial Exercise RX and Referring Provider   Date 10/01/24    Referring Provider Assaker, Domino, MD      Oxygen    Oxygen  Continuous    Liters 3    Maintain Oxygen  Saturation 88% or higher      Treadmill   MPH 1.6    Grade 0    Minutes 15    METs 2.23      Recumbant Bike   Level 1    RPM 50    Watts 15    Minutes 15    METs 2.26      NuStep   Level 1    SPM 80    Minutes 15    METs 2.26      T5 Nustep   Level 1    SPM 80    Minutes 15    METs 2.26      Biostep-RELP   Level 1    SPM 50    Minutes 15    METs 2.26      Track   Laps 19    Minutes 15    METs 2.03      Prescription Details   Frequency (times per week) 2    Duration Progress to 30 minutes of continuous aerobic without signs/symptoms of physical distress      Intensity   THRR 40-80% of Max Heartrate 103-129    Ratings of Perceived Exertion 11-13    Perceived Dyspnea 0-4      Progression   Progression Continue to progress workloads to maintain intensity without signs/symptoms of physical distress.      Resistance Training   Training Prescription Yes    Weight 4 lb    Reps 10-15          Exercise Goals: Frequency: Be able to perform aerobic exercise two to three times per week in program working toward 2-5 days per week of home exercise.  Intensity: Work with a perceived exertion of 11 (fairly light) - 15 (hard) while following your exercise prescription.  We will make changes to your  prescription with you as you progress through the program.   Duration: Be able to do 30 to 45 minutes of continuous aerobic exercise in addition to a 5 minute warm-up and a 5 minute cool-down routine.   Nutrition Goals: Your personal nutrition goals will be established when you do your nutrition analysis with the dietician.  The following are general nutrition guidelines to follow: Cholesterol < 200mg /day Sodium < 1500mg /day Fiber: Men over 50 yrs - 30 grams per day  Personal Goals:  Personal Goals and Risk Factors at Admission - 10/01/24 1607       Core Components/Risk Factors/Patient Goals on Admission    Weight Management Yes;Weight Maintenance    Intervention Weight Management: Develop a combined nutrition and exercise program designed to reach desired caloric intake, while maintaining appropriate intake of nutrient and fiber, sodium and fats, and appropriate energy expenditure required for the weight goal.;Weight Management: Provide education and appropriate resources  to help participant work on and attain dietary goals.;Weight Management/Obesity: Establish reasonable short term and long term weight goals.    Admit Weight 186 lb 6.4 oz (84.6 kg)    Goal Weight: Short Term 186 lb 6.4 oz (84.6 kg)    Goal Weight: Long Term 186 lb 6.4 oz (84.6 kg)    Expected Outcomes Short Term: Continue to assess and modify interventions until short term weight is achieved;Long Term: Adherence to nutrition and physical activity/exercise program aimed toward attainment of established weight goal;Weight Maintenance: Understanding of the daily nutrition guidelines, which includes 25-35% calories from fat, 7% or less cal from saturated fats, less than 200mg  cholesterol, less than 1.5gm of sodium, & 5 or more servings of fruits and vegetables daily;Understanding recommendations for meals to include 15-35% energy as protein, 25-35% energy from fat, 35-60% energy from carbohydrates, less than 200mg  of dietary  cholesterol, 20-35 gm of total fiber daily;Understanding of distribution of calorie intake throughout the day with the consumption of 4-5 meals/snacks    Hypertension Yes    Intervention Provide education on lifestyle modifcations including regular physical activity/exercise, weight management, moderate sodium restriction and increased consumption of fresh fruit, vegetables, and low fat dairy, alcohol moderation, and smoking cessation.;Monitor prescription use compliance.    Expected Outcomes Short Term: Continued assessment and intervention until BP is < 140/3mm HG in hypertensive participants. < 130/90mm HG in hypertensive participants with diabetes, heart failure or chronic kidney disease.;Long Term: Maintenance of blood pressure at goal levels.    Lipids Yes    Intervention Provide education and support for participant on nutrition & aerobic/resistive exercise along with prescribed medications to achieve LDL 70mg , HDL >40mg .    Expected Outcomes Short Term: Participant states understanding of desired cholesterol values and is compliant with medications prescribed. Participant is following exercise prescription and nutrition guidelines.;Long Term: Cholesterol controlled with medications as prescribed, with individualized exercise RX and with personalized nutrition plan. Value goals: LDL < 70mg , HDL > 40 mg.          Tobacco Use Initial Evaluation: Social History   Tobacco Use  Smoking Status Never  Smokeless Tobacco Never    Exercise Goals and Review:  Exercise Goals     Row Name 10/01/24 1603             Exercise Goals   Increase Physical Activity Yes       Intervention Provide advice, education, support and counseling about physical activity/exercise needs.;Develop an individualized exercise prescription for aerobic and resistive training based on initial evaluation findings, risk stratification, comorbidities and participant's personal goals.       Expected Outcomes Short  Term: Attend rehab on a regular basis to increase amount of physical activity.;Long Term: Exercising regularly at least 3-5 days a week.;Long Term: Add in home exercise to make exercise part of routine and to increase amount of physical activity.       Increase Strength and Stamina Yes       Intervention Provide advice, education, support and counseling about physical activity/exercise needs.;Develop an individualized exercise prescription for aerobic and resistive training based on initial evaluation findings, risk stratification, comorbidities and participant's personal goals.       Expected Outcomes Short Term: Increase workloads from initial exercise prescription for resistance, speed, and METs.;Short Term: Perform resistance training exercises routinely during rehab and add in resistance training at home;Long Term: Improve cardiorespiratory fitness, muscular endurance and strength as measured by increased METs and functional capacity ( )  Able to understand and use rate of perceived exertion (RPE) scale Yes       Intervention Provide education and explanation on how to use RPE scale       Expected Outcomes Short Term: Able to use RPE daily in rehab to express subjective intensity level;Long Term:  Able to use RPE to guide intensity level when exercising independently       Able to understand and use Dyspnea scale Yes       Intervention Provide education and explanation on how to use Dyspnea scale       Expected Outcomes Short Term: Able to use Dyspnea scale daily in rehab to express subjective sense of shortness of breath during exertion;Long Term: Able to use Dyspnea scale to guide intensity level when exercising independently       Knowledge and understanding of Target Heart Rate Range (THRR) Yes       Intervention Provide education and explanation of THRR including how the numbers were predicted and where they are located for reference       Expected Outcomes Short Term: Able to  state/look up THRR;Short Term: Able to use daily as guideline for intensity in rehab;Long Term: Able to use THRR to govern intensity when exercising independently       Able to check pulse independently Yes       Intervention Provide education and demonstration on how to check pulse in carotid and radial arteries.;Review the importance of being able to check your own pulse for safety during independent exercise       Expected Outcomes Short Term: Able to explain why pulse checking is important during independent exercise;Long Term: Able to check pulse independently and accurately       Understanding of Exercise Prescription Yes       Intervention Provide education, explanation, and written materials on patient's individual exercise prescription       Expected Outcomes Short Term: Able to explain program exercise prescription;Long Term: Able to explain home exercise prescription to exercise independently

## 2024-10-01 NOTE — Progress Notes (Signed)
 Pulmonary Individual Treatment Plan  Patient Details  Name: Mitchell Barnes MRN: [id number] Date of Birth: 12/07/1945 Referring Provider:   Conrad Ports Pulmonary Rehab from 10/01/2024 in [facility] Cardiac and Pulmonary Rehab  Referring Provider Assaker, Darrin, MD    Initial Encounter Date:  Flowsheet Row Pulmonary Rehab from 10/01/2024 in [facility] Cardiac and Pulmonary Rehab  Date 10/01/24    Visit Diagnosis: Pulmonary fibrosis (HCC)  Patient's Home Medications on Admission:  Current Outpatient Medications:    acetaminophen  (TYLENOL ) 650 MG CR tablet, Take 650 mg by mouth every 8 (eight) hours as needed for pain or fever., Disp: , Rfl:    carvedilol  (COREG ) 3.125 MG tablet, TAKE 1 TABLET BY MOUTH TWICE A DAY WITH A MEAL, Disp: 180 tablet, Rfl: 0   diphenhydrAMINE (BENADRYL) 25 MG tablet, Take 25 mg by mouth every 6 (six) hours as needed for itching., Disp: , Rfl:    ferrous sulfate  325 (65 FE) MG tablet, Take 325 mg by mouth every Monday, Wednesday, and Friday. Monday, Thursday, Disp: , Rfl:    furosemide  (LASIX ) 20 MG tablet, Take 20 mg by mouth as needed., Disp: , Rfl:    mycophenolate (CELLCEPT) 500 MG tablet, Take 1 tablet by mouth twice daily for 14 days. Repeat labs. When instructed, increase to take 2 tablets by mouth twice daily for 14 days. Repeat labs., Disp: 84 tablet, Rfl: 0   neomycin -polymyxin-hydrocortisone (CORTISPORIN) 3.5-10000-1 OTIC suspension, Apply 1-2 drops daily after soaking and cover with bandaid, Disp: 10 mL, Rfl: 0   pantoprazole  (PROTONIX ) 40 MG tablet, Take 1 tablet (40 mg total) by mouth daily., Disp: 30 tablet, Rfl: 6   PHENobarbital  (LUMINAL) 32.4 MG tablet, Take 64.8 mg by mouth 2 (two) times daily., Disp: , Rfl:    Potassium Chloride ER 20 MEQ TBCR, Take 1 tablet by mouth daily as needed., Disp: , Rfl:    predniSONE  (DELTASONE ) 20 MG tablet, TAKE 2 TABLETS BY MOUTH DAILY WITH BREAKFAST., Disp: 80 tablet, Rfl: 0   rosuvastatin  (CRESTOR ) 20 MG  tablet, Take 1 tablet (20 mg total) by mouth daily., Disp: 30 tablet, Rfl: 11   sulfamethoxazole -trimethoprim  (BACTRIM  DS) 800-160 MG tablet, Take 1 tablet by mouth 3 (three) times a week. On Mon, Wed, Fri (Patient not taking: Reported on 09/16/2024), Disp: 40 tablet, Rfl: 3   vitamin B-12 (CYANOCOBALAMIN ) 500 MCG tablet, Take 500 mcg by mouth daily., Disp: , Rfl:    Vitamin D , Ergocalciferol , (DRISDOL ) 1.25 MG (50000 UNIT) CAPS capsule, Take 50,000 Units by mouth every Tuesday., Disp: , Rfl:    warfarin (COUMADIN ) 5 MG tablet, TAKE 1-2 TABLETS BY MOUTH DAILY OR AS PRESCRIBED BY CLINIC, Disp: 180 tablet, Rfl: 1  Past Medical History: Past Medical History:  Diagnosis Date   Anemia    Aortic atherosclerosis 04/28/2022   HLD (hyperlipidemia)    Seizures (HCC)     Tobacco Use: Social History   Tobacco Use  Smoking Status Never  Smokeless Tobacco Never    Labs: Review Flowsheet       Latest Ref Rng & Units 04/15/2020 04/16/2020 04/18/2022 04/19/2022  Labs for ITP Cardiac and Pulmonary Rehab  Cholestrol 0 - 200 mg/dL 806  - - 825   LDL (calc) 0 - 99 mg/dL 893  - - 91   HDL-C >59 mg/dL 74  - - 71   Trlycerides <150 mg/dL 67  - - 61   Hemoglobin A1c 4.8 - 5.6 % - 5.8  5.8  -     Pulmonary  Assessment Scores:  Pulmonary Assessment Scores     Row Name 10/01/24 1604         ADL UCSD   ADL Phase Entry     SOB Score total 11     Rest 0     Walk 0     Stairs 3     Bath 2     Dress 0     Shop 2       CAT Score   CAT Score 14       mMRC Score   mMRC Score 1        UCSD: Self-administered rating of dyspnea associated with activities of daily living (ADLs) 6-point scale (0 = not at all to 5 = maximal or unable to do because of breathlessness)  Scoring Scores range from 0 to 120.  Minimally important difference is 5 units  CAT: CAT can identify the health impairment of COPD patients and is better correlated with disease progression.  CAT has a scoring range of zero to  40. The CAT score is classified into four groups of low (less than 10), medium (10 - 20), high (21-30) and very high (31-40) based on the impact level of disease on health status. A CAT score over 10 suggests significant symptoms.  A worsening CAT score could be explained by an exacerbation, poor medication adherence, poor inhaler technique, or progression of COPD or comorbid conditions.  CAT MCID is 2 points  mMRC: mMRC (Modified Medical Research Council) Dyspnea Scale is used to assess the degree of baseline functional disability in patients of respiratory disease due to dyspnea. No minimal important difference is established. A decrease in score of 1 point or greater is considered a positive change.   Pulmonary Function Assessment:   Exercise Target Goals: Exercise Program Goal: Individual exercise prescription set using results from initial 6 min walk test and THRR while considering  patient's activity barriers and safety.   Exercise Prescription Goal: Initial exercise prescription builds to 30-45 minutes a day of aerobic activity, 2-3 days per week.  Home exercise guidelines will be given to patient during program as part of exercise prescription that the participant will acknowledge.  Education: Aerobic Exercise: - Group verbal and visual presentation on the components of exercise prescription. Introduces F.I.T.T principle from ACSM for exercise prescriptions.  Reviews F.I.T.T. principles of aerobic exercise including progression. Written material provided at class time.   Education: Resistance Exercise: - Group verbal and visual presentation on the components of exercise prescription. Introduces F.I.T.T principle from ACSM for exercise prescriptions  Reviews F.I.T.T. principles of resistance exercise including progression. Written material provided at class time.    Education: Exercise & Equipment Safety: - Individual verbal instruction and demonstration of equipment use and safety  with use of the equipment. Flowsheet Row Pulmonary Rehab from 10/01/2024 in [facility] Cardiac and Pulmonary Rehab  Date 10/01/24  Educator MB  Instruction Review Code 1- Verbalizes Understanding    Education: Exercise Physiology & General Exercise Guidelines: - Group verbal and written instruction with models to review the exercise physiology of the cardiovascular system and associated critical values. Provides general exercise guidelines with specific guidelines to those with heart or lung disease.    Education: Flexibility, Balance, Mind/Body Relaxation: - Group verbal and visual presentation with interactive activity on the components of exercise prescription. Introduces F.I.T.T principle from ACSM for exercise prescriptions. Reviews F.I.T.T. principles of flexibility and balance exercise training including progression. Also discusses the mind body connection.  Reviews various  relaxation techniques to help reduce and manage stress (i.e. Deep breathing, progressive muscle relaxation, and visualization). Balance handout provided to take home. Written material provided at class time.   Activity Barriers & Risk Stratification:  Activity Barriers & Cardiac Risk Stratification - 10/01/24 1559       Activity Barriers & Cardiac Risk Stratification   Activity Barriers Joint Problems;Balance Concerns          6 Minute Walk:  6 Minute Walk     Row Name 10/01/24 1557         6 Minute Walk   Phase Initial     Distance 700 feet     Walk Time 3.9 minutes     # of Rest Breaks 3     MPH 2.04     METS 2.26     RPE 15     Perceived Dyspnea  2     VO2 Peak 7.9     Symptoms No     Resting HR 77 bpm     Resting BP 140/80     Resting Oxygen  Saturation  92 %     Exercise Oxygen  Saturation  during 6 min walk 74 %     Max Ex. HR 141 bpm     Max Ex. BP 188/70     2 Minute Post BP 152/80       Interval HR   1 Minute HR 71     2 Minute HR 129     3 Minute HR 141     4 Minute HR 126     5  Minute HR 130     6 Minute HR 135     2 Minute Post HR 98     Interval Heart Rate? Yes       Interval Oxygen    Interval Oxygen ? Yes     Baseline Oxygen  Saturation % 92 %     1 Minute Oxygen  Saturation % 83 %     1 Minute Liters of Oxygen  3 L     2 Minute Oxygen  Saturation % 78 %     2 Minute Liters of Oxygen  3 L     3 Minute Oxygen  Saturation % 75 %     3 Minute Liters of Oxygen  3 L     4 Minute Oxygen  Saturation % 74 %     4 Minute Liters of Oxygen  3 L     5 Minute Oxygen  Saturation % 78 %     5 Minute Liters of Oxygen  3 L     6 Minute Oxygen  Saturation % 75 %     6 Minute Liters of Oxygen  3 L     2 Minute Post Oxygen  Saturation % 85 %     2 Minute Post Liters of Oxygen  3 L       Oxygen  Initial Assessment:  Oxygen  Initial Assessment - 09/25/24 0932       Home Oxygen    Home Oxygen  Device E-Tanks    Sleep Oxygen  Prescription Continuous    Liters per minute 3    Home Exercise Oxygen  Prescription Continuous    Liters per minute 3    Home Resting Oxygen  Prescription Continuous    Liters per minute 3    Compliance with Home Oxygen  Use Yes      Intervention   Short Term Goals To learn and exhibit compliance with exercise, home and travel O2 prescription;To learn and understand importance of monitoring SPO2 with pulse oximeter and demonstrate  accurate use of the pulse oximeter.;To learn and understand importance of maintaining oxygen  saturations>88%;To learn and demonstrate proper pursed lip breathing techniques or other breathing techniques. ;To learn and demonstrate proper use of respiratory medications    Long  Term Goals Exhibits compliance with exercise, home  and travel O2 prescription;Verbalizes importance of monitoring SPO2 with pulse oximeter and return demonstration;Maintenance of O2 saturations>88%;Exhibits proper breathing techniques, such as pursed lip breathing or other method taught during program session;Compliance with respiratory medication;Demonstrates proper use  of MDI's          Oxygen  Re-Evaluation:   Oxygen  Discharge (Final Oxygen  Re-Evaluation):   Initial Exercise Prescription:  Initial Exercise Prescription - 10/01/24 1500       Date of Initial Exercise RX and Referring Provider   Date 10/01/24    Referring Provider Assaker, Darrin, MD      Oxygen    Oxygen  Continuous    Liters 3    Maintain Oxygen  Saturation 88% or higher      Treadmill   MPH 1.6    Grade 0    Minutes 15    METs 2.23      Recumbant Bike   Level 1    RPM 50    Watts 15    Minutes 15    METs 2.26      NuStep   Level 1    SPM 80    Minutes 15    METs 2.26      T5 Nustep   Level 1    SPM 80    Minutes 15    METs 2.26      Biostep-RELP   Level 1    SPM 50    Minutes 15    METs 2.26      Track   Laps 19    Minutes 15    METs 2.03      Prescription Details   Frequency (times per week) 2    Duration Progress to 30 minutes of continuous aerobic without signs/symptoms of physical distress      Intensity   THRR 40-80% of Max Heartrate 103-129    Ratings of Perceived Exertion 11-13    Perceived Dyspnea 0-4      Progression   Progression Continue to progress workloads to maintain intensity without signs/symptoms of physical distress.      Resistance Training   Training Prescription Yes    Weight 4 lb    Reps 10-15          Perform Capillary Blood Glucose checks as needed.  Exercise Prescription Changes:   Exercise Prescription Changes     Row Name 10/01/24 1600             Response to Exercise   Blood Pressure (Admit) 140/80       Blood Pressure (Exercise) 188/70       Blood Pressure (Exit) 152/80       Heart Rate (Admit) 77 bpm       Heart Rate (Exercise) 141 bpm       Heart Rate (Exit) 98 bpm       Oxygen  Saturation (Admit) 92 %       Oxygen  Saturation (Exercise) 74 %       Oxygen  Saturation (Exit) 85 %       Rating of Perceived Exertion (Exercise) 15       Perceived Dyspnea (Exercise) 2       Symptoms  none       Comments  results         Progression   Average METs 2.26         Resistance Training   Training Prescription Yes       Weight 4 lb       Reps 10-15         Oxygen    Oxygen  Continuous       Liters 3         Treadmill   MPH 1.6       Grade 0       Minutes 15       METs 2.23         Recumbant Bike   Level 1       RPM 50       Watts 15       Minutes 15       METs 2.26         NuStep   Level 1       SPM 80       Minutes 15       METs 2.26         T5 Nustep   Level 1       SPM 80       Minutes 15       METs 2.26         Biostep-RELP   Level 1       SPM 50       Minutes 15       METs 2.26         Track   Laps 19       Minutes 15       METs 2.03         Oxygen    Maintain Oxygen  Saturation 88% or higher          Exercise Comments:   Exercise Goals and Review:   Exercise Goals     Row Name 10/01/24 1603             Exercise Goals   Increase Physical Activity Yes       Intervention Provide advice, education, support and counseling about physical activity/exercise needs.;Develop an individualized exercise prescription for aerobic and resistive training based on initial evaluation findings, risk stratification, comorbidities and participant's personal goals.       Expected Outcomes Short Term: Attend rehab on a regular basis to increase amount of physical activity.;Long Term: Exercising regularly at least 3-5 days a week.;Long Term: Add in home exercise to make exercise part of routine and to increase amount of physical activity.       Increase Strength and Stamina Yes       Intervention Provide advice, education, support and counseling about physical activity/exercise needs.;Develop an individualized exercise prescription for aerobic and resistive training based on initial evaluation findings, risk stratification, comorbidities and participant's personal goals.       Expected Outcomes Short Term: Increase workloads from initial exercise  prescription for resistance, speed, and METs.;Short Term: Perform resistance training exercises routinely during rehab and add in resistance training at home;Long Term: Improve cardiorespiratory fitness, muscular endurance and strength as measured by increased METs and functional capacity ( )       Able to understand and use rate of perceived exertion (RPE) scale Yes       Intervention Provide education and explanation on how to use RPE scale       Expected Outcomes Short Term: Able to use RPE daily in  rehab to express subjective intensity level;Long Term:  Able to use RPE to guide intensity level when exercising independently       Able to understand and use Dyspnea scale Yes       Intervention Provide education and explanation on how to use Dyspnea scale       Expected Outcomes Short Term: Able to use Dyspnea scale daily in rehab to express subjective sense of shortness of breath during exertion;Long Term: Able to use Dyspnea scale to guide intensity level when exercising independently       Knowledge and understanding of Target Heart Rate Range (THRR) Yes       Intervention Provide education and explanation of THRR including how the numbers were predicted and where they are located for reference       Expected Outcomes Short Term: Able to state/look up THRR;Short Term: Able to use daily as guideline for intensity in rehab;Long Term: Able to use THRR to govern intensity when exercising independently       Able to check pulse independently Yes       Intervention Provide education and demonstration on how to check pulse in carotid and radial arteries.;Review the importance of being able to check your own pulse for safety during independent exercise       Expected Outcomes Short Term: Able to explain why pulse checking is important during independent exercise;Long Term: Able to check pulse independently and accurately       Understanding of Exercise Prescription Yes       Intervention Provide  education, explanation, and written materials on patient's individual exercise prescription       Expected Outcomes Short Term: Able to explain program exercise prescription;Long Term: Able to explain home exercise prescription to exercise independently          Exercise Goals Re-Evaluation :   Discharge Exercise Prescription (Final Exercise Prescription Changes):  Exercise Prescription Changes - 10/01/24 1600       Response to Exercise   Blood Pressure (Admit) 140/80    Blood Pressure (Exercise) 188/70    Blood Pressure (Exit) 152/80    Heart Rate (Admit) 77 bpm    Heart Rate (Exercise) 141 bpm    Heart Rate (Exit) 98 bpm    Oxygen  Saturation (Admit) 92 %    Oxygen  Saturation (Exercise) 74 %    Oxygen  Saturation (Exit) 85 %    Rating of Perceived Exertion (Exercise) 15    Perceived Dyspnea (Exercise) 2    Symptoms none    Comments results      Progression   Average METs 2.26      Resistance Training   Training Prescription Yes    Weight 4 lb    Reps 10-15      Oxygen    Oxygen  Continuous    Liters 3      Treadmill   MPH 1.6    Grade 0    Minutes 15    METs 2.23      Recumbant Bike   Level 1    RPM 50    Watts 15    Minutes 15    METs 2.26      NuStep   Level 1    SPM 80    Minutes 15    METs 2.26      T5 Nustep   Level 1    SPM 80    Minutes 15    METs 2.26      Biostep-RELP  Level 1    SPM 50    Minutes 15    METs 2.26      Track   Laps 19    Minutes 15    METs 2.03      Oxygen    Maintain Oxygen  Saturation 88% or higher          Nutrition:  Target Goals: Understanding of nutrition guidelines, daily intake of sodium 1500mg , cholesterol 200mg , calories 30% from fat and 7% or less from saturated fats, daily to have 5 or more servings of fruits and vegetables.  Education: Nutrition 1 -Group instruction provided by verbal, written material, interactive activities, discussions, models, and posters to present general guidelines  for heart healthy nutrition including macronutrients, label reading, and promoting whole foods over processed counterparts. Education serves as pensions consultant of discussion of heart healthy eating for all. Written material provided at class time.     Education: Nutrition 2 -Group instruction provided by verbal, written material, interactive activities, discussions, models, and posters to present general guidelines for heart healthy nutrition including sodium, cholesterol, and saturated fat. Providing guidance of habit forming to improve blood pressure, cholesterol, and body weight. Written material provided at class time.     Biometrics:  Pre Biometrics - 10/01/24 1604       Pre Biometrics   Height 5' 6.14 (1.68 m)    Weight 186 lb 6.4 oz (84.6 kg)    Waist Circumference 44.5 inches    Hip Circumference 43 inches    Waist to Hip Ratio 1.03 %    BMI (Calculated) 29.96    Single Leg Stand 2.6 seconds           Nutrition Therapy Plan and Nutrition Goals:  Nutrition Therapy & Goals - 10/01/24 1605       Personal Nutrition Goals   Nutrition Goal Will meet w/ RD on 11/4      Intervention Plan   Intervention Prescribe, educate and counsel regarding individualized specific dietary modifications aiming towards targeted core components such as weight, hypertension, lipid management, diabetes, heart failure and other comorbidities.    Expected Outcomes Short Term Goal: Understand basic principles of dietary content, such as calories, fat, sodium, cholesterol and nutrients.          Nutrition Assessments:  MEDIFICTS Score Key: >=70 Need to make dietary changes  40-70 Heart Healthy Diet <= 40 Therapeutic Level Cholesterol Diet  Flowsheet Row Pulmonary Rehab from 10/01/2024 in [facility] Cardiac and Pulmonary Rehab  Picture Your Plate Total Score on Admission 70   Picture Your Plate Scores: <59 Unhealthy dietary pattern with much room for improvement. 41-50 Dietary pattern unlikely to  meet recommendations for good health and room for improvement. 51-60 More healthful dietary pattern, with some room for improvement.  >60 Healthy dietary pattern, although there may be some specific behaviors that could be improved.   Nutrition Goals Re-Evaluation:   Nutrition Goals Discharge (Final Nutrition Goals Re-Evaluation):   Psychosocial: Target Goals: Acknowledge presence or absence of significant depression and/or stress, maximize coping skills, provide positive support system. Participant is able to verbalize types and ability to use techniques and skills needed for reducing stress and depression.   Education: Stress, Anxiety, and Depression - Group verbal and visual presentation to define topics covered.  Reviews how body is impacted by stress, anxiety, and depression.  Also discusses healthy ways to reduce stress and to treat/manage anxiety and depression.  Written material provided at class time.   Education: Sleep Hygiene -Provides group verbal  and written instruction about how sleep can affect your health.  Define sleep hygiene, discuss sleep cycles and impact of sleep habits. Review good sleep hygiene tips.    Initial Review & Psychosocial Screening:  Initial Psych Review & Screening - 09/25/24 0940       Initial Review   Current issues with None Identified      Family Dynamics   Good Support System? Yes      Barriers   Psychosocial barriers to participate in program There are no identifiable barriers or psychosocial needs.;The patient should benefit from training in stress management and relaxation.      Screening Interventions   Interventions Encouraged to exercise;Provide feedback about the scores to participant;To provide support and resources with identified psychosocial needs    Expected Outcomes Short Term goal: Utilizing psychosocial counselor, staff and physician to assist with identification of specific Stressors or current issues interfering with  healing process. Setting desired goal for each stressor or current issue identified.;Long Term Goal: Stressors or current issues are controlled or eliminated.;Short Term goal: Identification and review with participant of any Quality of Life or Depression concerns found by scoring the questionnaire.;Long Term goal: The participant improves quality of Life and PHQ9 Scores as seen by post scores and/or verbalization of changes          Quality of Life Scores:  Scores of 19 and below usually indicate a poorer quality of life in these areas.  A difference of  2-3 points is a clinically meaningful difference.  A difference of 2-3 points in the total score of the Quality of Life Index has been associated with significant improvement in overall quality of life, self-image, physical symptoms, and general health in studies assessing change in quality of life.  PHQ-9: Review Flowsheet       10/01/2024  Depression screen PHQ 2/9  Decreased Interest 0  Down, Depressed, Hopeless 1  PHQ - 2 Score 1  Altered sleeping 1  Tired, decreased energy 1  Change in appetite 0  Feeling bad or failure about yourself  0  Trouble concentrating 0  Moving slowly or fidgety/restless 0  Suicidal thoughts 0  PHQ-9 Score 3  Difficult doing work/chores Somewhat difficult   Interpretation of Total Score  Total Score Depression Severity:  1-4 = Minimal depression, 5-9 = Mild depression, 10-14 = Moderate depression, 15-19 = Moderately severe depression, 20-27 = Severe depression   Psychosocial Evaluation and Intervention:  Psychosocial Evaluation - 09/25/24 0948       Psychosocial Evaluation & Interventions   Interventions Encouraged to exercise with the program and follow exercise prescription    Comments Mr. Sproule is coming to pulmonary rehab with pulmonary fibrosis. He states he has noticed his shortness of breath has gotten worse over the last few months. When asked about stress, he mentions he doesn't  really worry about much and just takes it as it comes. He states his wife is the one who worries about things and helps manage his health. He reports no sleep concerns. He is ready to start the program to work on stamina.    Expected Outcomes Short: attend cardiac rehab for education and exercise Long: develop and maintain positive self care habits    Continue Psychosocial Services  Follow up required by staff          Psychosocial Re-Evaluation:   Psychosocial Discharge (Final Psychosocial Re-Evaluation):   Education: Education Goals: Education classes will be provided on a weekly basis, covering required topics. Participant  will state understanding/return demonstration of topics presented.  Learning Barriers/Preferences:  Learning Barriers/Preferences - 09/25/24 0939       Learning Barriers/Preferences   Learning Barriers None    Learning Preferences None          General Pulmonary Education Topics:  Infection Prevention: - Provides verbal and written material to individual with discussion of infection control including proper hand washing and proper equipment cleaning during exercise session. Flowsheet Row Pulmonary Rehab from 10/01/2024 in [facility] Cardiac and Pulmonary Rehab  Date 10/01/24  Educator MB  Instruction Review Code 1- Verbalizes Understanding    Falls Prevention: - Provides verbal and written material to individual with discussion of falls prevention and safety. Flowsheet Row Pulmonary Rehab from 10/01/2024 in [facility] Cardiac and Pulmonary Rehab  Date 10/01/24  Educator MB  Instruction Review Code 1- Verbalizes Understanding    Chronic Lung Disease Review: - Group verbal instruction with posters, models, PowerPoint presentations and videos,  to review new updates, new respiratory medications, new advancements in procedures and treatments. Providing information on websites and 800 numbers for continued self-education. Includes information about supplement  oxygen , available portable oxygen  systems, continuous and intermittent flow rates, oxygen  safety, concentrators, and Medicare reimbursement for oxygen . Explanation of Pulmonary Drugs, including class, frequency, complications, importance of spacers, rinsing mouth after steroid MDI's, and proper cleaning methods for nebulizers. Review of basic lung anatomy and physiology related to function, structure, and complications of lung disease. Review of risk factors. Discussion about methods for diagnosing sleep apnea and types of masks and machines for OSA. Includes a review of the use of types of environmental controls: home humidity, furnaces, filters, dust mite/pet prevention, HEPA vacuums. Discussion about weather changes, air quality and the benefits of nasal washing. Instruction on Warning signs, infection symptoms, calling MD promptly, preventive modes, and value of vaccinations. Review of effective airway clearance, coughing and/or vibration techniques. Emphasizing that all should Create an Action Plan. Written material provided at class time.   AED/CPR: - Group verbal and written instruction with the use of models to demonstrate the basic use of the AED with the basic ABC's of resuscitation.    Tests and Procedures:  - Group verbal and visual presentation and models provide information about basic cardiac anatomy and function. Reviews the testing methods done to diagnose heart disease and the outcomes of the test results. Describes the treatment choices: Medical Management, Angioplasty, or Coronary Bypass Surgery for treating various heart conditions including Myocardial Infarction, Angina, Valve Disease, and Cardiac Arrhythmias.  Written material provided at class time.   Medication Safety: - Group verbal and visual instruction to review commonly prescribed medications for heart and lung disease. Reviews the medication, class of the drug, and side effects. Includes the steps to properly store meds  and maintain the prescription regimen.  Written material given at graduation.   Other: -Provides group and verbal instruction on various topics (see comments)   Knowledge Questionnaire Score:  Knowledge Questionnaire Score - 10/01/24 1607       Knowledge Questionnaire Score   Pre Score 14/18           Core Components/Risk Factors/Patient Goals at Admission:  Personal Goals and Risk Factors at Admission - 10/01/24 1607       Core Components/Risk Factors/Patient Goals on Admission    Weight Management Yes;Weight Maintenance    Intervention Weight Management: Develop a combined nutrition and exercise program designed to reach desired caloric intake, while maintaining appropriate intake of nutrient and fiber, sodium and fats,  and appropriate energy expenditure required for the weight goal.;Weight Management: Provide education and appropriate resources to help participant work on and attain dietary goals.;Weight Management/Obesity: Establish reasonable short term and long term weight goals.    Admit Weight 186 lb 6.4 oz (84.6 kg)    Goal Weight: Short Term 186 lb 6.4 oz (84.6 kg)    Goal Weight: Long Term 186 lb 6.4 oz (84.6 kg)    Expected Outcomes Short Term: Continue to assess and modify interventions until short term weight is achieved;Long Term: Adherence to nutrition and physical activity/exercise program aimed toward attainment of established weight goal;Weight Maintenance: Understanding of the daily nutrition guidelines, which includes 25-35% calories from fat, 7% or less cal from saturated fats, less than 200mg  cholesterol, less than 1.5gm of sodium, & 5 or more servings of fruits and vegetables daily;Understanding recommendations for meals to include 15-35% energy as protein, 25-35% energy from fat, 35-60% energy from carbohydrates, less than 200mg  of dietary cholesterol, 20-35 gm of total fiber daily;Understanding of distribution of calorie intake throughout the day with the  consumption of 4-5 meals/snacks    Hypertension Yes    Intervention Provide education on lifestyle modifcations including regular physical activity/exercise, weight management, moderate sodium restriction and increased consumption of fresh fruit, vegetables, and low fat dairy, alcohol moderation, and smoking cessation.;Monitor prescription use compliance.    Expected Outcomes Short Term: Continued assessment and intervention until BP is < 140/51mm HG in hypertensive participants. < 130/43mm HG in hypertensive participants with diabetes, heart failure or chronic kidney disease.;Long Term: Maintenance of blood pressure at goal levels.    Lipids Yes    Intervention Provide education and support for participant on nutrition & aerobic/resistive exercise along with prescribed medications to achieve LDL 70mg , HDL >40mg .    Expected Outcomes Short Term: Participant states understanding of desired cholesterol values and is compliant with medications prescribed. Participant is following exercise prescription and nutrition guidelines.;Long Term: Cholesterol controlled with medications as prescribed, with individualized exercise RX and with personalized nutrition plan. Value goals: LDL < 70mg , HDL > 40 mg.          Education:Diabetes - Individual verbal and written instruction to review signs/symptoms of diabetes, desired ranges of glucose level fasting, after meals and with exercise. Acknowledge that pre and post exercise glucose checks will be done for 3 sessions at entry of program.   Know Your Numbers and Heart Failure: - Group verbal and visual instruction to discuss disease risk factors for cardiac and pulmonary disease and treatment options.  Reviews associated critical values for Overweight/Obesity, Hypertension, Cholesterol, and Diabetes.  Discusses basics of heart failure: signs/symptoms and treatments.  Introduces Heart Failure Zone chart for action plan for heart failure. Written material provided  at class time.   Core Components/Risk Factors/Patient Goals Review:    Core Components/Risk Factors/Patient Goals at Discharge (Final Review):    ITP Comments:  ITP Comments     Row Name 09/25/24 0947 10/01/24 1557         ITP Comments Initial phone call completed. Diagnosis can be found in [facility] 9/17. EP Orientation scheduled for Thursday 10/30 at 2pm. Completed and gym orientation for respiratory care services. Initial ITP created and sent for review to Dr. Fuad Aleskerov, Medical Director.         Comments: Initial ITP

## 2024-10-02 ENCOUNTER — Other Ambulatory Visit: Payer: Self-pay

## 2024-10-02 DIAGNOSIS — Z79899 Other long term (current) drug therapy: Secondary | ICD-10-CM

## 2024-10-02 NOTE — Progress Notes (Signed)
 CBC/CMP ordered for [facility] - CellCept monitoring.

## 2024-10-05 ENCOUNTER — Other Ambulatory Visit: Payer: Self-pay | Admitting: Pulmonary Disease

## 2024-10-05 DIAGNOSIS — Z5181 Encounter for therapeutic drug level monitoring: Secondary | ICD-10-CM

## 2024-10-05 DIAGNOSIS — Z2989 Encounter for other specified prophylactic measures: Secondary | ICD-10-CM

## 2024-10-05 DIAGNOSIS — J849 Interstitial pulmonary disease, unspecified: Secondary | ICD-10-CM

## 2024-10-06 ENCOUNTER — Ambulatory Visit: Admitting: Internal Medicine

## 2024-10-06 ENCOUNTER — Encounter

## 2024-10-06 LAB — COMPREHENSIVE METABOLIC PANEL WITH GFR
ALT: 18 IU/L (ref 0–44)
AST: 21 IU/L (ref 0–40)
Albumin: 4.1 g/dL (ref 3.8–4.8)
Alkaline Phosphatase: 58 IU/L (ref 47–123)
BUN/Creatinine Ratio: 16 (ref 10–24)
BUN: 19 mg/dL (ref 8–27)
Bilirubin Total: 0.2 mg/dL (ref 0.0–1.2)
CO2: 24 mmol/L (ref 20–29)
Calcium: 9.6 mg/dL (ref 8.6–10.2)
Chloride: 102 mmol/L (ref 96–106)
Creatinine, Ser: 1.17 mg/dL (ref 0.76–1.27)
Globulin, Total: 2.8 g/dL (ref 1.5–4.5)
Glucose: 76 mg/dL (ref 70–99)
Potassium: 4.3 mmol/L (ref 3.5–5.2)
Sodium: 141 mmol/L (ref 134–144)
Total Protein: 6.9 g/dL (ref 6.0–8.5)
eGFR: 64 mL/min/1.73 (ref >59)

## 2024-10-06 LAB — CBC WITH DIFFERENTIAL/PLATELET
Basophils Absolute: 0.1 x10E3/uL (ref 0.0–0.2)
Basos: 1 %
EOS (ABSOLUTE): 0.2 x10E3/uL (ref 0.0–0.4)
Eos: 3 %
Hematocrit: 34.5 % — ABNORMAL LOW (ref 37.5–51.0)
Hemoglobin: 11.3 g/dL — ABNORMAL LOW (ref 13.0–17.7)
Immature Grans (Abs): 0 x10E3/uL (ref 0.0–0.1)
Immature Granulocytes: 0 %
Lymphocytes Absolute: 2.1 x10E3/uL (ref 0.7–3.1)
Lymphs: 28 %
MCH: 31.1 pg (ref 26.6–33.0)
MCHC: 32.8 g/dL (ref 31.5–35.7)
MCV: 95 fL (ref 79–97)
Monocytes Absolute: 0.7 x10E3/uL (ref 0.1–0.9)
Monocytes: 9 %
Neutrophils Absolute: 4.4 x10E3/uL (ref 1.4–7.0)
Neutrophils: 58 %
Platelets: 298 x10E3/uL (ref 150–450)
RBC: 3.63 x10E6/uL — ABNORMAL LOW (ref 4.14–5.80)
RDW: 12.2 % (ref 11.6–15.4)
WBC: 7.5 x10E3/uL (ref 3.4–10.8)

## 2024-10-07 ENCOUNTER — Ambulatory Visit: Attending: Cardiovascular Disease

## 2024-10-07 DIAGNOSIS — I639 Cerebral infarction, unspecified: Secondary | ICD-10-CM | POA: Diagnosis not present

## 2024-10-07 DIAGNOSIS — Z7901 Long term (current) use of anticoagulants: Secondary | ICD-10-CM | POA: Diagnosis not present

## 2024-10-07 LAB — POCT INR: INR: 1.9 — AB (ref 2.0–3.0)

## 2024-10-07 NOTE — Patient Instructions (Signed)
 Take 2 tablets today only then continue 1 tablet Daily, except 1.5 tablets every Wednesday.  Recheck INR in 2 weeks.  [phone]

## 2024-10-08 ENCOUNTER — Encounter

## 2024-10-08 MED ORDER — MYCOPHENOLATE MOFETIL 500 MG PO TABS: 1000.0000 mg | ORAL_TABLET | Freq: Two times a day (BID) | ORAL | 0 refills | Status: AC

## 2024-10-08 NOTE — Progress Notes (Deleted)
 Lab results stable since starting CellCept. Called patient to review.   He reports adherence to CellCept 500mg  BID. He has been taking this dose for about 3 weeks.   Given labs are stable, instructed to increase to CellCept 1000mg  BID.   Repeat labs in about 2 weeks - he has appt with Dr. Chiquita on 10/21/24. Repeat labs at that time.   Patient verbalizes understanding and agreement with plan.

## 2024-10-08 NOTE — Addendum Note (Signed)
 Addended by: Zaylan Kissoon L on: 10/08/2024 10:13 AM   Modules accepted: Orders

## 2024-10-08 NOTE — Telephone Encounter (Signed)
 See lab results CBC/CMP 10/05/24. Stable from previous.   Called patient to review.    He reports adherence to CellCept 500mg  BID. He has been taking this dose for about 3 weeks.    Given labs are stable, instructed to increase to CellCept 1000mg  BID. (See Dr. Nelda note 09/16/24).    Repeat labs in about 2 weeks - he has appt with Dr. Chiquita on 10/21/24. Repeat labs at that time.    Patient verbalizes understanding and agreement with plan.  Aleck Puls, PharmD, BCPS, CPP Clinical Pharmacist  [facility]

## 2024-10-08 NOTE — Telephone Encounter (Addendum)
 Reviewed labs CBC/CMP 10/05/24. Stable from previous on CellCept 500mg  BID. According to Dr. Nelda note 09/16/24, advised patient to increase CellCept to 1000mg  BID. Rx updated. Plan to repeat labs in about 2 weeks. Upcoming OV with Dr. Geronimo on 10/21/24. Plan to repeat labs at that time.

## 2024-10-08 NOTE — Addendum Note (Signed)
 Addended by: Matheu Ploeger L on: 10/08/2024 10:16 AM   Modules accepted: Orders

## 2024-10-13 ENCOUNTER — Encounter

## 2024-10-15 ENCOUNTER — Encounter

## 2024-10-20 ENCOUNTER — Encounter

## 2024-10-20 ENCOUNTER — Encounter: Payer: Self-pay | Admitting: *Deleted

## 2024-10-20 DIAGNOSIS — J8410 Pulmonary fibrosis, unspecified: Secondary | ICD-10-CM

## 2024-10-20 NOTE — Progress Notes (Signed)
 Pulmonary Individual Treatment Plan  Patient Details  Name: Mitchell Barnes MRN: [id number] Date of Birth: 1946-03-31 Referring Provider:   Conrad Ports Pulmonary Rehab from 10/01/2024 in [facility] Cardiac and Pulmonary Rehab  Referring Provider Assaker, Darrin, MD    Initial Encounter Date:  Flowsheet Row Pulmonary Rehab from 10/01/2024 in [facility] Cardiac and Pulmonary Rehab  Date 10/01/24    Visit Diagnosis: Pulmonary fibrosis (HCC)  Patient's Home Medications on Admission:  Current Outpatient Medications:    acetaminophen  (TYLENOL ) 650 MG CR tablet, Take 650 mg by mouth every 8 (eight) hours as needed for pain or fever., Disp: , Rfl:    carvedilol  (COREG ) 3.125 MG tablet, TAKE 1 TABLET BY MOUTH TWICE A DAY WITH A MEAL, Disp: 180 tablet, Rfl: 0   diphenhydrAMINE (BENADRYL) 25 MG tablet, Take 25 mg by mouth every 6 (six) hours as needed for itching., Disp: , Rfl:    ferrous sulfate  325 (65 FE) MG tablet, Take 325 mg by mouth every Monday, Wednesday, and Friday. Monday, Thursday, Disp: , Rfl:    furosemide  (LASIX ) 20 MG tablet, Take 20 mg by mouth as needed., Disp: , Rfl:    mycophenolate (CELLCEPT) 500 MG tablet, Take 2 tablets (1,000 mg total) by mouth 2 (two) times daily. Repeat labs 2 weeks after starting this dose., Disp: 120 tablet, Rfl: 0   neomycin -polymyxin-hydrocortisone (CORTISPORIN) 3.5-10000-1 OTIC suspension, Apply 1-2 drops daily after soaking and cover with bandaid, Disp: 10 mL, Rfl: 0   pantoprazole  (PROTONIX ) 40 MG tablet, Take 1 tablet (40 mg total) by mouth daily., Disp: 30 tablet, Rfl: 6   PHENobarbital  (LUMINAL) 32.4 MG tablet, Take 64.8 mg by mouth 2 (two) times daily., Disp: , Rfl:    Potassium Chloride ER 20 MEQ TBCR, Take 1 tablet by mouth daily as needed., Disp: , Rfl:    predniSONE  (DELTASONE ) 20 MG tablet, TAKE 2 TABLETS BY MOUTH DAILY WITH BREAKFAST., Disp: 80 tablet, Rfl: 0   rosuvastatin  (CRESTOR ) 20 MG tablet, Take 1 tablet (20 mg total) by mouth  daily., Disp: 30 tablet, Rfl: 11   sulfamethoxazole -trimethoprim  (BACTRIM  DS) 800-160 MG tablet, Take 1 tablet by mouth 3 (three) times a week. On Mon, Wed, Fri (Patient not taking: Reported on 09/16/2024), Disp: 40 tablet, Rfl: 3   vitamin B-12 (CYANOCOBALAMIN ) 500 MCG tablet, Take 500 mcg by mouth daily., Disp: , Rfl:    Vitamin D , Ergocalciferol , (DRISDOL ) 1.25 MG (50000 UNIT) CAPS capsule, Take 50,000 Units by mouth every Tuesday., Disp: , Rfl:    warfarin (COUMADIN ) 5 MG tablet, TAKE 1-2 TABLETS BY MOUTH DAILY OR AS PRESCRIBED BY CLINIC, Disp: 180 tablet, Rfl: 1  Past Medical History: Past Medical History:  Diagnosis Date   Anemia    Aortic atherosclerosis 04/28/2022   HLD (hyperlipidemia)    Seizures (HCC)     Tobacco Use: Social History   Tobacco Use  Smoking Status Never  Smokeless Tobacco Never    Labs: Review Flowsheet       Latest Ref Rng & Units 04/15/2020 04/16/2020 04/18/2022 04/19/2022  Labs for ITP Cardiac and Pulmonary Rehab  Cholestrol 0 - 200 mg/dL 806  - - 825   LDL (calc) 0 - 99 mg/dL 893  - - 91   HDL-C >59 mg/dL 74  - - 71   Trlycerides <150 mg/dL 67  - - 61   Hemoglobin A1c 4.8 - 5.6 % - 5.8  5.8  -     Pulmonary Assessment Scores:  Pulmonary Assessment Scores  Row Name 10/01/24 1604         ADL UCSD   ADL Phase Entry     SOB Score total 11     Rest 0     Walk 0     Stairs 3     Bath 2     Dress 0     Shop 2       CAT Score   CAT Score 14       mMRC Score   mMRC Score 1        UCSD: Self-administered rating of dyspnea associated with activities of daily living (ADLs) 6-point scale (0 = not at all to 5 = maximal or unable to do because of breathlessness)  Scoring Scores range from 0 to 120.  Minimally important difference is 5 units  CAT: CAT can identify the health impairment of COPD patients and is better correlated with disease progression.  CAT has a scoring range of zero to 40. The CAT score is classified into four  groups of low (less than 10), medium (10 - 20), high (21-30) and very high (31-40) based on the impact level of disease on health status. A CAT score over 10 suggests significant symptoms.  A worsening CAT score could be explained by an exacerbation, poor medication adherence, poor inhaler technique, or progression of COPD or comorbid conditions.  CAT MCID is 2 points  mMRC: mMRC (Modified Medical Research Council) Dyspnea Scale is used to assess the degree of baseline functional disability in patients of respiratory disease due to dyspnea. No minimal important difference is established. A decrease in score of 1 point or greater is considered a positive change.   Pulmonary Function Assessment:   Exercise Target Goals: Exercise Program Goal: Individual exercise prescription set using results from initial 6 min walk test and THRR while considering  patient's activity barriers and safety.   Exercise Prescription Goal: Initial exercise prescription builds to 30-45 minutes a day of aerobic activity, 2-3 days per week.  Home exercise guidelines will be given to patient during program as part of exercise prescription that the participant will acknowledge.  Education: Aerobic Exercise: - Group verbal and visual presentation on the components of exercise prescription. Introduces F.I.T.T principle from ACSM for exercise prescriptions.  Reviews F.I.T.T. principles of aerobic exercise including progression. Written material provided at class time.   Education: Resistance Exercise: - Group verbal and visual presentation on the components of exercise prescription. Introduces F.I.T.T principle from ACSM for exercise prescriptions  Reviews F.I.T.T. principles of resistance exercise including progression. Written material provided at class time.    Education: Exercise & Equipment Safety: - Individual verbal instruction and demonstration of equipment use and safety with use of the equipment. Flowsheet Row  Pulmonary Rehab from 10/01/2024 in [facility] Cardiac and Pulmonary Rehab  Date 10/01/24  Educator MB  Instruction Review Code 1- Verbalizes Understanding    Education: Exercise Physiology & General Exercise Guidelines: - Group verbal and written instruction with models to review the exercise physiology of the cardiovascular system and associated critical values. Provides general exercise guidelines with specific guidelines to those with heart or lung disease.    Education: Flexibility, Balance, Mind/Body Relaxation: - Group verbal and visual presentation with interactive activity on the components of exercise prescription. Introduces F.I.T.T principle from ACSM for exercise prescriptions. Reviews F.I.T.T. principles of flexibility and balance exercise training including progression. Also discusses the mind body connection.  Reviews various relaxation techniques to help reduce and manage stress (i.e. Deep  breathing, progressive muscle relaxation, and visualization). Balance handout provided to take home. Written material provided at class time.   Activity Barriers & Risk Stratification:  Activity Barriers & Cardiac Risk Stratification - 10/01/24 1559       Activity Barriers & Cardiac Risk Stratification   Activity Barriers Joint Problems;Balance Concerns          6 Minute Walk:  6 Minute Walk     Row Name 10/01/24 1557         6 Minute Walk   Phase Initial     Distance 700 feet     Walk Time 3.9 minutes     # of Rest Breaks 3     MPH 2.04     METS 2.26     RPE 15     Perceived Dyspnea  2     VO2 Peak 7.9     Symptoms No     Resting HR 77 bpm     Resting BP 140/80     Resting Oxygen  Saturation  92 %     Exercise Oxygen  Saturation  during 6 min walk 74 %     Max Ex. HR 141 bpm     Max Ex. BP 188/70     2 Minute Post BP 152/80       Interval HR   1 Minute HR 71     2 Minute HR 129     3 Minute HR 141     4 Minute HR 126     5 Minute HR 130     6 Minute HR 135     2  Minute Post HR 98     Interval Heart Rate? Yes       Interval Oxygen    Interval Oxygen ? Yes     Baseline Oxygen  Saturation % 92 %     1 Minute Oxygen  Saturation % 83 %     1 Minute Liters of Oxygen  3 L     2 Minute Oxygen  Saturation % 78 %     2 Minute Liters of Oxygen  3 L     3 Minute Oxygen  Saturation % 75 %     3 Minute Liters of Oxygen  3 L     4 Minute Oxygen  Saturation % 74 %     4 Minute Liters of Oxygen  3 L     5 Minute Oxygen  Saturation % 78 %     5 Minute Liters of Oxygen  3 L     6 Minute Oxygen  Saturation % 75 %     6 Minute Liters of Oxygen  3 L     2 Minute Post Oxygen  Saturation % 85 %     2 Minute Post Liters of Oxygen  3 L       Oxygen  Initial Assessment:  Oxygen  Initial Assessment - 09/25/24 0932       Home Oxygen    Home Oxygen  Device E-Tanks    Sleep Oxygen  Prescription Continuous    Liters per minute 3    Home Exercise Oxygen  Prescription Continuous    Liters per minute 3    Home Resting Oxygen  Prescription Continuous    Liters per minute 3    Compliance with Home Oxygen  Use Yes      Intervention   Short Term Goals To learn and exhibit compliance with exercise, home and travel O2 prescription;To learn and understand importance of monitoring SPO2 with pulse oximeter and demonstrate accurate use of the pulse oximeter.;To learn and understand importance  of maintaining oxygen  saturations>88%;To learn and demonstrate proper pursed lip breathing techniques or other breathing techniques. ;To learn and demonstrate proper use of respiratory medications    Long  Term Goals Exhibits compliance with exercise, home  and travel O2 prescription;Verbalizes importance of monitoring SPO2 with pulse oximeter and return demonstration;Maintenance of O2 saturations>88%;Exhibits proper breathing techniques, such as pursed lip breathing or other method taught during program session;Compliance with respiratory medication;Demonstrates proper use of MDI's          Oxygen   Re-Evaluation:   Oxygen  Discharge (Final Oxygen  Re-Evaluation):   Initial Exercise Prescription:  Initial Exercise Prescription - 10/01/24 1500       Date of Initial Exercise RX and Referring Provider   Date 10/01/24    Referring Provider Assaker, Darrin, MD      Oxygen    Oxygen  Continuous    Liters 3    Maintain Oxygen  Saturation 88% or higher      Treadmill   MPH 1.6    Grade 0    Minutes 15    METs 2.23      Recumbant Bike   Level 1    RPM 50    Watts 15    Minutes 15    METs 2.26      NuStep   Level 1    SPM 80    Minutes 15    METs 2.26      T5 Nustep   Level 1    SPM 80    Minutes 15    METs 2.26      Biostep-RELP   Level 1    SPM 50    Minutes 15    METs 2.26      Track   Laps 19    Minutes 15    METs 2.03      Prescription Details   Frequency (times per week) 2    Duration Progress to 30 minutes of continuous aerobic without signs/symptoms of physical distress      Intensity   THRR 40-80% of Max Heartrate 103-129    Ratings of Perceived Exertion 11-13    Perceived Dyspnea 0-4      Progression   Progression Continue to progress workloads to maintain intensity without signs/symptoms of physical distress.      Resistance Training   Training Prescription Yes    Weight 4 lb    Reps 10-15          Perform Capillary Blood Glucose checks as needed.  Exercise Prescription Changes:   Exercise Prescription Changes     Row Name 10/01/24 1600             Response to Exercise   Blood Pressure (Admit) 140/80       Blood Pressure (Exercise) 188/70       Blood Pressure (Exit) 152/80       Heart Rate (Admit) 77 bpm       Heart Rate (Exercise) 141 bpm       Heart Rate (Exit) 98 bpm       Oxygen  Saturation (Admit) 92 %       Oxygen  Saturation (Exercise) 74 %       Oxygen  Saturation (Exit) 85 %       Rating of Perceived Exertion (Exercise) 15       Perceived Dyspnea (Exercise) 2       Symptoms none       Comments  results  Progression   Average METs 2.26         Resistance Training   Training Prescription Yes       Weight 4 lb       Reps 10-15         Oxygen    Oxygen  Continuous       Liters 3         Treadmill   MPH 1.6       Grade 0       Minutes 15       METs 2.23         Recumbant Bike   Level 1       RPM 50       Watts 15       Minutes 15       METs 2.26         NuStep   Level 1       SPM 80       Minutes 15       METs 2.26         T5 Nustep   Level 1       SPM 80       Minutes 15       METs 2.26         Biostep-RELP   Level 1       SPM 50       Minutes 15       METs 2.26         Track   Laps 19       Minutes 15       METs 2.03         Oxygen    Maintain Oxygen  Saturation 88% or higher          Exercise Comments:   Exercise Goals and Review:   Exercise Goals     Row Name 10/01/24 1603             Exercise Goals   Increase Physical Activity Yes       Intervention Provide advice, education, support and counseling about physical activity/exercise needs.;Develop an individualized exercise prescription for aerobic and resistive training based on initial evaluation findings, risk stratification, comorbidities and participant's personal goals.       Expected Outcomes Short Term: Attend rehab on a regular basis to increase amount of physical activity.;Long Term: Exercising regularly at least 3-5 days a week.;Long Term: Add in home exercise to make exercise part of routine and to increase amount of physical activity.       Increase Strength and Stamina Yes       Intervention Provide advice, education, support and counseling about physical activity/exercise needs.;Develop an individualized exercise prescription for aerobic and resistive training based on initial evaluation findings, risk stratification, comorbidities and participant's personal goals.       Expected Outcomes Short Term: Increase workloads from initial exercise prescription for  resistance, speed, and METs.;Short Term: Perform resistance training exercises routinely during rehab and add in resistance training at home;Long Term: Improve cardiorespiratory fitness, muscular endurance and strength as measured by increased METs and functional capacity ( )       Able to understand and use rate of perceived exertion (RPE) scale Yes       Intervention Provide education and explanation on how to use RPE scale       Expected Outcomes Short Term: Able to use RPE daily in rehab to express subjective intensity level;Long Term:  Able to  use RPE to guide intensity level when exercising independently       Able to understand and use Dyspnea scale Yes       Intervention Provide education and explanation on how to use Dyspnea scale       Expected Outcomes Short Term: Able to use Dyspnea scale daily in rehab to express subjective sense of shortness of breath during exertion;Long Term: Able to use Dyspnea scale to guide intensity level when exercising independently       Knowledge and understanding of Target Heart Rate Range (THRR) Yes       Intervention Provide education and explanation of THRR including how the numbers were predicted and where they are located for reference       Expected Outcomes Short Term: Able to state/look up THRR;Short Term: Able to use daily as guideline for intensity in rehab;Long Term: Able to use THRR to govern intensity when exercising independently       Able to check pulse independently Yes       Intervention Provide education and demonstration on how to check pulse in carotid and radial arteries.;Review the importance of being able to check your own pulse for safety during independent exercise       Expected Outcomes Short Term: Able to explain why pulse checking is important during independent exercise;Long Term: Able to check pulse independently and accurately       Understanding of Exercise Prescription Yes       Intervention Provide education, explanation,  and written materials on patient's individual exercise prescription       Expected Outcomes Short Term: Able to explain program exercise prescription;Long Term: Able to explain home exercise prescription to exercise independently          Exercise Goals Re-Evaluation :   Discharge Exercise Prescription (Final Exercise Prescription Changes):  Exercise Prescription Changes - 10/01/24 1600       Response to Exercise   Blood Pressure (Admit) 140/80    Blood Pressure (Exercise) 188/70    Blood Pressure (Exit) 152/80    Heart Rate (Admit) 77 bpm    Heart Rate (Exercise) 141 bpm    Heart Rate (Exit) 98 bpm    Oxygen  Saturation (Admit) 92 %    Oxygen  Saturation (Exercise) 74 %    Oxygen  Saturation (Exit) 85 %    Rating of Perceived Exertion (Exercise) 15    Perceived Dyspnea (Exercise) 2    Symptoms none    Comments results      Progression   Average METs 2.26      Resistance Training   Training Prescription Yes    Weight 4 lb    Reps 10-15      Oxygen    Oxygen  Continuous    Liters 3      Treadmill   MPH 1.6    Grade 0    Minutes 15    METs 2.23      Recumbant Bike   Level 1    RPM 50    Watts 15    Minutes 15    METs 2.26      NuStep   Level 1    SPM 80    Minutes 15    METs 2.26      T5 Nustep   Level 1    SPM 80    Minutes 15    METs 2.26      Biostep-RELP   Level 1    SPM 50  Minutes 15    METs 2.26      Track   Laps 19    Minutes 15    METs 2.03      Oxygen    Maintain Oxygen  Saturation 88% or higher          Nutrition:  Target Goals: Understanding of nutrition guidelines, daily intake of sodium 1500mg , cholesterol 200mg , calories 30% from fat and 7% or less from saturated fats, daily to have 5 or more servings of fruits and vegetables.  Education: Nutrition 1 -Group instruction provided by verbal, written material, interactive activities, discussions, models, and posters to present general guidelines for heart healthy  nutrition including macronutrients, label reading, and promoting whole foods over processed counterparts. Education serves as pensions consultant of discussion of heart healthy eating for all. Written material provided at class time.     Education: Nutrition 2 -Group instruction provided by verbal, written material, interactive activities, discussions, models, and posters to present general guidelines for heart healthy nutrition including sodium, cholesterol, and saturated fat. Providing guidance of habit forming to improve blood pressure, cholesterol, and body weight. Written material provided at class time.     Biometrics:  Pre Biometrics - 10/01/24 1604       Pre Biometrics   Height 5' 6.14 (1.68 m)    Weight 186 lb 6.4 oz (84.6 kg)    Waist Circumference 44.5 inches    Hip Circumference 43 inches    Waist to Hip Ratio 1.03 %    BMI (Calculated) 29.96    Single Leg Stand 2.6 seconds           Nutrition Therapy Plan and Nutrition Goals:  Nutrition Therapy & Goals - 10/01/24 1605       Personal Nutrition Goals   Nutrition Goal Will meet w/ RD on 11/4      Intervention Plan   Intervention Prescribe, educate and counsel regarding individualized specific dietary modifications aiming towards targeted core components such as weight, hypertension, lipid management, diabetes, heart failure and other comorbidities.    Expected Outcomes Short Term Goal: Understand basic principles of dietary content, such as calories, fat, sodium, cholesterol and nutrients.          Nutrition Assessments:  MEDIFICTS Score Key: >=70 Need to make dietary changes  40-70 Heart Healthy Diet <= 40 Therapeutic Level Cholesterol Diet  Flowsheet Row Pulmonary Rehab from 10/01/2024 in [facility] Cardiac and Pulmonary Rehab  Picture Your Plate Total Score on Admission 70   Picture Your Plate Scores: <59 Unhealthy dietary pattern with much room for improvement. 41-50 Dietary pattern unlikely to meet  recommendations for good health and room for improvement. 51-60 More healthful dietary pattern, with some room for improvement.  >60 Healthy dietary pattern, although there may be some specific behaviors that could be improved.   Nutrition Goals Re-Evaluation:   Nutrition Goals Discharge (Final Nutrition Goals Re-Evaluation):   Psychosocial: Target Goals: Acknowledge presence or absence of significant depression and/or stress, maximize coping skills, provide positive support system. Participant is able to verbalize types and ability to use techniques and skills needed for reducing stress and depression.   Education: Stress, Anxiety, and Depression - Group verbal and visual presentation to define topics covered.  Reviews how body is impacted by stress, anxiety, and depression.  Also discusses healthy ways to reduce stress and to treat/manage anxiety and depression.  Written material provided at class time.   Education: Sleep Hygiene -Provides group verbal and written instruction about how sleep can affect your health.  Define sleep hygiene, discuss sleep cycles and impact of sleep habits. Review good sleep hygiene tips.    Initial Review & Psychosocial Screening:  Initial Psych Review & Screening - 09/25/24 0940       Initial Review   Current issues with None Identified      Family Dynamics   Good Support System? Yes      Barriers   Psychosocial barriers to participate in program There are no identifiable barriers or psychosocial needs.;The patient should benefit from training in stress management and relaxation.      Screening Interventions   Interventions Encouraged to exercise;Provide feedback about the scores to participant;To provide support and resources with identified psychosocial needs    Expected Outcomes Short Term goal: Utilizing psychosocial counselor, staff and physician to assist with identification of specific Stressors or current issues interfering with healing  process. Setting desired goal for each stressor or current issue identified.;Long Term Goal: Stressors or current issues are controlled or eliminated.;Short Term goal: Identification and review with participant of any Quality of Life or Depression concerns found by scoring the questionnaire.;Long Term goal: The participant improves quality of Life and PHQ9 Scores as seen by post scores and/or verbalization of changes          Quality of Life Scores:  Scores of 19 and below usually indicate a poorer quality of life in these areas.  A difference of  2-3 points is a clinically meaningful difference.  A difference of 2-3 points in the total score of the Quality of Life Index has been associated with significant improvement in overall quality of life, self-image, physical symptoms, and general health in studies assessing change in quality of life.  PHQ-9: Review Flowsheet       10/01/2024  Depression screen PHQ 2/9  Decreased Interest 0  Down, Depressed, Hopeless 1  PHQ - 2 Score 1  Altered sleeping 1  Tired, decreased energy 1  Change in appetite 0  Feeling bad or failure about yourself  0  Trouble concentrating 0  Moving slowly or fidgety/restless 0  Suicidal thoughts 0  PHQ-9 Score 3   Difficult doing work/chores Somewhat difficult    Details       Data saved with a previous flowsheet row definition        Interpretation of Total Score  Total Score Depression Severity:  1-4 = Minimal depression, 5-9 = Mild depression, 10-14 = Moderate depression, 15-19 = Moderately severe depression, 20-27 = Severe depression   Psychosocial Evaluation and Intervention:  Psychosocial Evaluation - 09/25/24 0948       Psychosocial Evaluation & Interventions   Interventions Encouraged to exercise with the program and follow exercise prescription    Comments Mr. Biehn is coming to pulmonary rehab with pulmonary fibrosis. He states he has noticed his shortness of breath has gotten worse  over the last few months. When asked about stress, he mentions he doesn't really worry about much and just takes it as it comes. He states his wife is the one who worries about things and helps manage his health. He reports no sleep concerns. He is ready to start the program to work on stamina.    Expected Outcomes Short: attend cardiac rehab for education and exercise Long: develop and maintain positive self care habits    Continue Psychosocial Services  Follow up required by staff          Psychosocial Re-Evaluation:   Psychosocial Discharge (Final Psychosocial Re-Evaluation):   Education: Education Goals:  Education classes will be provided on a weekly basis, covering required topics. Participant will state understanding/return demonstration of topics presented.  Learning Barriers/Preferences:  Learning Barriers/Preferences - 09/25/24 0939       Learning Barriers/Preferences   Learning Barriers None    Learning Preferences None          General Pulmonary Education Topics:  Infection Prevention: - Provides verbal and written material to individual with discussion of infection control including proper hand washing and proper equipment cleaning during exercise session. Flowsheet Row Pulmonary Rehab from 10/01/2024 in [facility] Cardiac and Pulmonary Rehab  Date 10/01/24  Educator MB  Instruction Review Code 1- Verbalizes Understanding    Falls Prevention: - Provides verbal and written material to individual with discussion of falls prevention and safety. Flowsheet Row Pulmonary Rehab from 10/01/2024 in [facility] Cardiac and Pulmonary Rehab  Date 10/01/24  Educator MB  Instruction Review Code 1- Verbalizes Understanding    Chronic Lung Disease Review: - Group verbal instruction with posters, models, PowerPoint presentations and videos,  to review new updates, new respiratory medications, new advancements in procedures and treatments. Providing information on websites and 800  numbers for continued self-education. Includes information about supplement oxygen , available portable oxygen  systems, continuous and intermittent flow rates, oxygen  safety, concentrators, and Medicare reimbursement for oxygen . Explanation of Pulmonary Drugs, including class, frequency, complications, importance of spacers, rinsing mouth after steroid MDI's, and proper cleaning methods for nebulizers. Review of basic lung anatomy and physiology related to function, structure, and complications of lung disease. Review of risk factors. Discussion about methods for diagnosing sleep apnea and types of masks and machines for OSA. Includes a review of the use of types of environmental controls: home humidity, furnaces, filters, dust mite/pet prevention, HEPA vacuums. Discussion about weather changes, air quality and the benefits of nasal washing. Instruction on Warning signs, infection symptoms, calling MD promptly, preventive modes, and value of vaccinations. Review of effective airway clearance, coughing and/or vibration techniques. Emphasizing that all should Create an Action Plan. Written material provided at class time.   AED/CPR: - Group verbal and written instruction with the use of models to demonstrate the basic use of the AED with the basic ABC's of resuscitation.    Tests and Procedures:  - Group verbal and visual presentation and models provide information about basic cardiac anatomy and function. Reviews the testing methods done to diagnose heart disease and the outcomes of the test results. Describes the treatment choices: Medical Management, Angioplasty, or Coronary Bypass Surgery for treating various heart conditions including Myocardial Infarction, Angina, Valve Disease, and Cardiac Arrhythmias.  Written material provided at class time.   Medication Safety: - Group verbal and visual instruction to review commonly prescribed medications for heart and lung disease. Reviews the medication, class  of the drug, and side effects. Includes the steps to properly store meds and maintain the prescription regimen.  Written material given at graduation.   Other: -Provides group and verbal instruction on various topics (see comments)   Knowledge Questionnaire Score:  Knowledge Questionnaire Score - 10/01/24 1607       Knowledge Questionnaire Score   Pre Score 14/18           Core Components/Risk Factors/Patient Goals at Admission:  Personal Goals and Risk Factors at Admission - 10/01/24 1607       Core Components/Risk Factors/Patient Goals on Admission    Weight Management Yes;Weight Maintenance    Intervention Weight Management: Develop a combined nutrition and exercise program designed to reach desired  caloric intake, while maintaining appropriate intake of nutrient and fiber, sodium and fats, and appropriate energy expenditure required for the weight goal.;Weight Management: Provide education and appropriate resources to help participant work on and attain dietary goals.;Weight Management/Obesity: Establish reasonable short term and long term weight goals.    Admit Weight 186 lb 6.4 oz (84.6 kg)    Goal Weight: Short Term 186 lb 6.4 oz (84.6 kg)    Goal Weight: Long Term 186 lb 6.4 oz (84.6 kg)    Expected Outcomes Short Term: Continue to assess and modify interventions until short term weight is achieved;Long Term: Adherence to nutrition and physical activity/exercise program aimed toward attainment of established weight goal;Weight Maintenance: Understanding of the daily nutrition guidelines, which includes 25-35% calories from fat, 7% or less cal from saturated fats, less than 200mg  cholesterol, less than 1.5gm of sodium, & 5 or more servings of fruits and vegetables daily;Understanding recommendations for meals to include 15-35% energy as protein, 25-35% energy from fat, 35-60% energy from carbohydrates, less than 200mg  of dietary cholesterol, 20-35 gm of total fiber  daily;Understanding of distribution of calorie intake throughout the day with the consumption of 4-5 meals/snacks    Hypertension Yes    Intervention Provide education on lifestyle modifcations including regular physical activity/exercise, weight management, moderate sodium restriction and increased consumption of fresh fruit, vegetables, and low fat dairy, alcohol moderation, and smoking cessation.;Monitor prescription use compliance.    Expected Outcomes Short Term: Continued assessment and intervention until BP is < 140/31mm HG in hypertensive participants. < 130/57mm HG in hypertensive participants with diabetes, heart failure or chronic kidney disease.;Long Term: Maintenance of blood pressure at goal levels.    Lipids Yes    Intervention Provide education and support for participant on nutrition & aerobic/resistive exercise along with prescribed medications to achieve LDL 70mg , HDL >40mg .    Expected Outcomes Short Term: Participant states understanding of desired cholesterol values and is compliant with medications prescribed. Participant is following exercise prescription and nutrition guidelines.;Long Term: Cholesterol controlled with medications as prescribed, with individualized exercise RX and with personalized nutrition plan. Value goals: LDL < 70mg , HDL > 40 mg.          Education:Diabetes - Individual verbal and written instruction to review signs/symptoms of diabetes, desired ranges of glucose level fasting, after meals and with exercise. Acknowledge that pre and post exercise glucose checks will be done for 3 sessions at entry of program.   Know Your Numbers and Heart Failure: - Group verbal and visual instruction to discuss disease risk factors for cardiac and pulmonary disease and treatment options.  Reviews associated critical values for Overweight/Obesity, Hypertension, Cholesterol, and Diabetes.  Discusses basics of heart failure: signs/symptoms and treatments.  Introduces  Heart Failure Zone chart for action plan for heart failure. Written material provided at class time.   Core Components/Risk Factors/Patient Goals Review:    Core Components/Risk Factors/Patient Goals at Discharge (Final Review):    ITP Comments:  ITP Comments     Row Name 09/25/24 0947 10/01/24 1557 10/20/24 1004       ITP Comments Initial phone call completed. Diagnosis can be found in [facility] 9/17. EP Orientation scheduled for Thursday 10/30 at 2pm. Completed and gym orientation for respiratory care services. Initial ITP created and sent for review to Dr. Fuad Aleskerov, Medical Director. Patient requests to be discharged at this time due to his wife's ongoing health concerns. He completed orientation.        Comments: Early Discharge ITP

## 2024-10-20 NOTE — Progress Notes (Signed)
 Early Discharge Summary  [name] May 05, 1946  [name] is discharging early due to his wife's ongoing health concerns. He completed 1 of 36 sessions.    6 Minute Walk     Row Name 10/01/24 1557         6 Minute Walk   Phase Initial     Distance 700 feet     Walk Time 3.9 minutes     # of Rest Breaks 3     MPH 2.04     METS 2.26     RPE 15     Perceived Dyspnea  2     VO2 Peak 7.9     Symptoms No     Resting HR 77 bpm     Resting BP 140/80     Resting Oxygen  Saturation  92 %     Exercise Oxygen  Saturation  during 6 min walk 74 %     Max Ex. HR 141 bpm     Max Ex. BP 188/70     2 Minute Post BP 152/80       Interval HR   1 Minute HR 71     2 Minute HR 129     3 Minute HR 141     4 Minute HR 126     5 Minute HR 130     6 Minute HR 135     2 Minute Post HR 98     Interval Heart Rate? Yes       Interval Oxygen    Interval Oxygen ? Yes     Baseline Oxygen  Saturation % 92 %     1 Minute Oxygen  Saturation % 83 %     1 Minute Liters of Oxygen  3 L     2 Minute Oxygen  Saturation % 78 %     2 Minute Liters of Oxygen  3 L     3 Minute Oxygen  Saturation % 75 %     3 Minute Liters of Oxygen  3 L     4 Minute Oxygen  Saturation % 74 %     4 Minute Liters of Oxygen  3 L     5 Minute Oxygen  Saturation % 78 %     5 Minute Liters of Oxygen  3 L     6 Minute Oxygen  Saturation % 75 %     6 Minute Liters of Oxygen  3 L     2 Minute Post Oxygen  Saturation % 85 %     2 Minute Post Liters of Oxygen  3 L

## 2024-10-20 NOTE — Progress Notes (Unsigned)
 Synopsis: Referred in by Elliot Charm,*   Subjective:   PATIENT ID: Mitchell SAUNDERS Sugarman GENDER: male DOB: September 02, 1946, MRN: [id number]  Chief Complaint  Patient presents with   Interstitial Lung Disease    Increased SOB. No wheezing. Some cough.  Taking Cellcept and Prednisone .    HPI Mitchell Barnes is a pleasant 78 years old male patient with a past medical history of ILD complicated by chronic respiratory failure on 2-3L O2, hyperlipidemia, HFpEF, hypertension presenting today to the pulmonary clinic for the evaluation of ILD.   He reports that he has been having woresning shortness of breath for years associated with dry cough for years. Placed on Oxygen  in 2023 after he had COVID and was labeled as post COVID scarring.   He denies any difficult swallowing, raynaud, joint stiffness or suspicious rashes.   CTA chest in 2021 with minimal ILA.   CTA chest in 2023 with diffuse GGO consistent with COVID 19 at the time.   CT chest wo contrast 2023 subpleural reticulation with interlobular septal thickening and associated areas of GGO.   Echocardiogram 2025 - LVEF 55 to 60%, Grade I diastolic dysfunction, No significant vavlular disease.   Family history - Denies any family history of pulmonary diseases.   Social history - No signs of relevat envirnmental exposure. Never smoker. Lives at home with his wife.    OV 08/19/2024 - Mitchell Barnes was seen today to discuss his Autoimmune work up and ILD. His High res CT chest with progressive GGO, subpleural reticular opacity without significant spatial distribution. Autoimmune work up consistent with Sjogren's disease with + SSA at 71 and positive SSA 52KD at 115. ANA + at 1:160. Echocardiogram negative for PH with normal LVEF and normal RV function. Overall picture consistent with sjogren's Associated ILD. Discussed need to start Prednisone  and Cell cept which he is agreeble with. I will start him on bactrim  and PPI for  prophylaxis. I will also refer him to see Dr. Dorethia and Rheumatology. We will delay starting Ofev for now but aim to start it in 3 to 6 months.   OV 09/16/2024 - Mitchell Barnes is here for Sjogren's Associated ILD. He was started on prednisone  40mg  09/17. He is feeling slightly better but still has episodes where he feels sudden onset of shortness of breath. HE is supposed to start Cellcept today. He is not taking PPI nor Bactrim . I went over his medications with him and emphasized on the importance of taking bactrim  as well as PPI for prophylaxis. We will start tapering his prednisone  by 10mg  every 2 weeks and sustain at 10mg  until I see him again in January. I also emphasized on the importance of undergoing pulmonary rehab which he is agreeable with. I will plan to repeat a ct chest in 6 months and try for PFTs during that time as well.   OV 10/20/2024  Subjective:  Patient ID: Mitchell SAUNDERS Barnes, male , DOB: [birth year] , age 79 y.o. , MRN: [id number] , ADDRESS: [address] [place] [address] PCP Elliot Charm, MD Patient Care Team: Elliot Charm, MD as PCP - General (Internal Medicine) Perla Evalene PARAS, MD as PCP - Cardiology (Cardiology)  This Provider for this visit: Treatment Team:  Attending Provider: Geronimo Dorethia, MD    10/20/2024 -  No chief complaint on file.    HPI Mitchell Barnes 78 y.o. -    CT Chest data from date: ****  - personally visualized and independently interpreted : *** -  my findings are: *** Narrative & Impression  CLINICAL DATA:  Diffuse/interstitial lung disease.   EXAM: CT CHEST WITHOUT CONTRAST   TECHNIQUE: Multidetector CT imaging of the chest was performed following the standard protocol without intravenous contrast. High resolution imaging of the lungs, as well as inspiratory and expiratory imaging, was performed.   RADIATION DOSE REDUCTION: This exam was performed according to the departmental  dose-optimization program which includes automated exposure control, adjustment of the mA and/or kV according to patient size and/or use of iterative reconstruction technique.   COMPARISON:  09/19/2022.   FINDINGS: Cardiovascular: Atherosclerotic calcification of the aorta and coronary arteries. Ascending aorta measures 4.2 cm, previously 3.9 cm. Enlarged pulmonic trunk and heart. No pericardial effusion.   Mediastinum/Nodes: Nearly isodense 2.2 cm right thyroid  nodule. Mediastinal lymph nodes measure up to 13 mm in the low right paratracheal station, unchanged. Hilar regions are difficult to definitively evaluate without IV contrast. No axillary adenopathy. Esophagus is grossly unremarkable.   Lungs/Pleura: Subpleural reticulation, coarsening ground-glass and traction bronchiectasis/bronchiolectasis, without a definite zonal predominance, slightly progressive from 09/19/2022. No pleural fluid. Airway is unremarkable. Expiratory phase imaging was not performed in true expiration, limiting the evaluation for air trapping.   Upper Abdomen: Visualized portions of the liver, gallbladder, adrenal glands, spleen, pancreas, stomach and bowel are grossly unremarkable. No upper abdominal adenopathy.   Musculoskeletal: Degenerative changes in the spine.   IMPRESSION: 1. Pulmonary parenchymal pattern of interstitial lung disease, as detailed above, slightly progressive from 09/19/2022. Given lack of a craniocaudal gradient, findings are indeterminate for UIP per consensus guidelines: Diagnosis of Idiopathic Pulmonary Fibrosis: An Official ATS/ERS/JRS/ALAT Clinical Practice Guideline. Am JINNY Honey Crit Care Med Vol 198, Iss 5, ppe44-e68, Aug 03 2017. 2. 4.2 cm ascending aortic aneurysm, previously 3.9 cm. Recommend annual imaging followup by CTA or MRA. This recommendation follows 2010 ACCF/AHA/AATS/ACR/ASA/SCA/SCAI/SIR/STS/SVM Guidelines for the Diagnosis and Management of Patients with  Thoracic Aortic Disease. Circulation. 2010; 121: Z733-z630. Aortic aneurysm NOS (ICD10-I71.9). 3. 2.2 cm right thyroid  nodule. Recommend thyroid  ultrasound. (Ref: J Am Coll Radiol. 2015 Feb;12(2): 143-50). 4. Aortic atherosclerosis (ICD10-I70.0). Coronary artery calcification. 5. Enlarged pulmonic trunk, indicative of pulmonary arterial hypertension.     Electronically Signed   By: Newell Eke M.D.   On: 07/22/2024 11:30    PFT      No data to display             LAB RESULTS last 96 hours No results found.       has a past medical history of Anemia, Aortic atherosclerosis (04/28/2022), HLD (hyperlipidemia), and Seizures (HCC).   reports that he has never smoked. He has never used smokeless tobacco.  Past Surgical History:  Procedure Laterality Date   EYE SURGERY      Allergies  Allergen Reactions   Atorvastatin  Other (See Comments)    Muscle cramps   Atorvastatin  Calcium  Other (See Comments)   Fluconazole Rash and Dermatitis   Phenytoin Itching, Rash and Dermatitis    Body turns red    Immunization History  Administered Date(s) Administered   PFIZER(Purple Top)SARS-COV-2 Vaccination 01/15/2020, 02/05/2020, 11/15/2020   Pfizer(Comirnaty)Fall Seasonal Vaccine 12 years and older 10/04/2023    Family History  Problem Relation Age of Onset   Heart attack Father      Current Outpatient Medications:    acetaminophen  (TYLENOL ) 650 MG CR tablet, Take 650 mg by mouth every 8 (eight) hours as needed for pain or fever., Disp: , Rfl:    carvedilol  (COREG ) 3.125 MG  tablet, TAKE 1 TABLET BY MOUTH TWICE A DAY WITH A MEAL, Disp: 180 tablet, Rfl: 0   diphenhydrAMINE (BENADRYL) 25 MG tablet, Take 25 mg by mouth every 6 (six) hours as needed for itching., Disp: , Rfl:    ferrous sulfate  325 (65 FE) MG tablet, Take 325 mg by mouth every Monday, Wednesday, and Friday. Monday, Thursday, Disp: , Rfl:    furosemide  (LASIX ) 20 MG tablet, Take 20 mg by mouth as needed.,  Disp: , Rfl:    mycophenolate (CELLCEPT) 500 MG tablet, Take 2 tablets (1,000 mg total) by mouth 2 (two) times daily. Repeat labs 2 weeks after starting this dose., Disp: 120 tablet, Rfl: 0   neomycin -polymyxin-hydrocortisone (CORTISPORIN) 3.5-10000-1 OTIC suspension, Apply 1-2 drops daily after soaking and cover with bandaid, Disp: 10 mL, Rfl: 0   pantoprazole  (PROTONIX ) 40 MG tablet, Take 1 tablet (40 mg total) by mouth daily., Disp: 30 tablet, Rfl: 6   PHENobarbital  (LUMINAL) 32.4 MG tablet, Take 64.8 mg by mouth 2 (two) times daily., Disp: , Rfl:    Potassium Chloride ER 20 MEQ TBCR, Take 1 tablet by mouth daily as needed., Disp: , Rfl:    predniSONE  (DELTASONE ) 20 MG tablet, TAKE 2 TABLETS BY MOUTH DAILY WITH BREAKFAST., Disp: 80 tablet, Rfl: 0   rosuvastatin  (CRESTOR ) 20 MG tablet, Take 1 tablet (20 mg total) by mouth daily., Disp: 30 tablet, Rfl: 11   sulfamethoxazole -trimethoprim  (BACTRIM  DS) 800-160 MG tablet, Take 1 tablet by mouth 3 (three) times a week. On Mon, Wed, Fri (Patient not taking: Reported on 09/16/2024), Disp: 40 tablet, Rfl: 3   vitamin B-12 (CYANOCOBALAMIN ) 500 MCG tablet, Take 500 mcg by mouth daily., Disp: , Rfl:    Vitamin D , Ergocalciferol , (DRISDOL ) 1.25 MG (50000 UNIT) CAPS capsule, Take 50,000 Units by mouth every Tuesday., Disp: , Rfl:    warfarin (COUMADIN ) 5 MG tablet, TAKE 1-2 TABLETS BY MOUTH DAILY OR AS PRESCRIBED BY CLINIC, Disp: 180 tablet, Rfl: 1      Objective:   There were no vitals filed for this visit.  Estimated body mass index is 29.96 kg/m as calculated from the following:   Height as of 10/01/24: 5' 6.14 (1.68 m).   Weight as of 10/01/24: 186 lb 6.4 oz (84.6 kg).  @WEIGHTCHANGE @  There were no vitals filed for this visit.   Physical Exam   General: No distress. *** O2 at rest: *** Cane present: *** Sitting in wheel chair: *** Frail: *** Obese: *** Neuro: Alert and Oriented x 3. GCS 15. Speech normal Psych: Pleasant Resp:  Barrel  Chest - ***.  Wheeze - ***, Crackles - ***, No overt respiratory distress CVS: Normal heart sounds. Murmurs - *** Ext: Stigmata of Connective Tissue Disease - *** HEENT: Normal upper airway. PEERL +. No post nasal drip        Assessment/     Assessment & Plan ILD (interstitial lung disease) (HCC)    PLAN There are no Patient Instructions on file for this visit.    FOLLOWUP    No follow-ups on file.    SIGNATURE    Dr. Dorethia Cave, M.D., F.C.C.P,  Pulmonary and Critical Care Medicine Staff Physician, [facility] Director - Interstitial Lung Disease  Program  Pulmonary Fibrosis [facility] at [facility] [place], [place], [address]  Pager: [phone], If no answer or between  15:00h - 7:00h: call [phone] Telephone: [phone]  9:59 PM 10/20/2024  Moderate Complexity MDM OFFICE  2021 E/M guidelines, first released in 2021, with minor revisions added in 2023 and 2024 Must meet the requirements for 2 out of 3 dimensions to qualify.    Number and complexity of problems addressed Amount and/or complexity of data reviewed Risk of complications and/or morbidity  One or more chronic illness with mild exacerbation, OR progression, OR  side effects of treatment  Two or more stable chronic illnesses  One undiagnosed new problem with uncertain prognosis  One acute illness with systemic symptoms   One Acute complicated injury Must meet the requirements for 1 of 3 of the categories)  Category 1: Tests and documents, historian  Any combination of 3 of the following:  Assessment requiring an independent historian  Review of prior external note(s) from each unique source  Review of results of each unique test  Ordering of each unique test    Category 2: Interpretation of tests   Independent interpretation of a test performed by another physician/other qualified health care professional (not separately  reported)  Category 3: Discuss management/tests  Discussion of management or test interpretation with external physician/other qualified health care professional/appropriate source (not separately reported) Moderate risk of morbidity from additional diagnostic testing or treatment Examples only:  Prescription drug management  Decision regarding minor surgery with identfied patient or procedure risk factors  Decision regarding elective major surgery without identified patient or procedure risk factors  Diagnosis or treatment significantly limited by social determinants of health             HIGh Complexity  OFFICE   2021 E/M guidelines, first released in 2021, with minor revisions added in 2023. Must meet the requirements for 2 out of 3 dimensions to qualify.    Number and complexity of problems addressed Amount and/or complexity of data reviewed Risk of complications and/or morbidity  Severe exacerbation of chronic illness  Acute or chronic illnesses that may pose a threat to life or bodily function, e.g., multiple trauma, acute MI, pulmonary embolus, severe respiratory distress, progressive rheumatoid arthritis, psychiatric illness with potential threat to self or others, peritonitis, acute renal failure, abrupt change in neurological status Must meet the requirements for 2 of 3 of the categories)  Category 1: Tests and documents, historian  Any combination of 3 of the following:  Assessment requiring an independent historian  Review of prior external note(s) from each unique source  Review of results of each unique test  Ordering of each unique test    Category 2: Interpretation of tests    Independent interpretation of a test performed by another physician/other qualified health care professional (not separately reported)  Category 3: Discuss management/tests  Discussion of management or test interpretation with external physician/other qualified health care  professional/appropriate source (not separately reported)  HIGH risk of morbidity from additional diagnostic testing or treatment Examples only:  Drug therapy requiring intensive monitoring for toxicity  Decision for elective major surgery with identified pateint or procedure risk factors  Decision regarding hospitalization or escalation of level of care  Decision for DNR or to de-escalate care   Parenteral controlled  substances            LEGEND - Independent interpretation involves the interpretation of a test for which there is a CPT code, and an interpretation or report is customary. When a review and interpretation of a test is performed and documented by the provider, but not separately reported (billed), then this would represent an independent interpretation. This report does  not need to conform to the usual standards of a complete report of the test. This does not include interpretation of tests that do not have formal reports such as a complete blood count with differential and blood cultures. Examples would include reviewing a chest radiograph and documenting in the medical record an interpretation, but not separately reporting (billing) the interpretation of the chest radiograph.   An appropriate source includes professionals who are not health care professionals but may be involved in the management of the patient, such as a clinical research associate, upper officer, case manager or teacher, and does not include discussion with family or informal caregivers.    - SDOH: SDOH are the conditions in the environments where people are born, live, learn, work, play, worship, and age that affect a wide range of health, functioning, and quality-of-life outcomes and risks. (e.g., housing, food insecurity, transportation, etc.). SDOH-related Z codes ranging from Z55-Z65 are the ICD-10-CM diagnosis codes used to document SDOH data Z55 - Problems related to education and literacy Z56 - Problems  related to employment and unemployment Z57 - Occupational exposure to risk factors Z58 - Problems related to physical environment Z59 - Problems related to housing and economic circumstances [phone] - Problems related to social environment [phone] - Problems related to upbringing [phone] - Other problems related to primary support group, including family circumstances Z44 - Problems related to certain psychosocial circumstances Z65 - Problems related to other psychosocial circumstances

## 2024-10-21 ENCOUNTER — Ambulatory Visit: Admitting: Internal Medicine

## 2024-10-21 ENCOUNTER — Ambulatory Visit

## 2024-10-21 DIAGNOSIS — J849 Interstitial pulmonary disease, unspecified: Secondary | ICD-10-CM

## 2024-10-22 ENCOUNTER — Encounter

## 2024-10-27 ENCOUNTER — Encounter

## 2024-11-03 ENCOUNTER — Encounter

## 2024-11-04 ENCOUNTER — Other Ambulatory Visit: Payer: Self-pay | Admitting: Pulmonary Disease

## 2024-11-04 ENCOUNTER — Ambulatory Visit: Payer: Self-pay | Admitting: Internal Medicine

## 2024-11-04 DIAGNOSIS — J849 Interstitial pulmonary disease, unspecified: Secondary | ICD-10-CM

## 2024-11-05 ENCOUNTER — Encounter

## 2024-11-10 ENCOUNTER — Encounter

## 2024-11-10 NOTE — Progress Notes (Signed)
 Interstitial Lung Disease Multidisciplinary Conference   Mitchell Barnes    MRN [id number]    DOB August 24, 1946  Primary Care Physician:Varadarajan, Valery, MD  Referring Physician: Dr. Malka  Time of Conference: 7.30am- 8.30am Date of conference: 11/04/2024 Location of Conference: -  Virtual  Participating Pulmonary: Dr. Dorethia Cave, Dr Lonna Coder, Pathology: - Radiology: Dr Newell Eke Others: Dr. Adrien  Brief History: 78 y/o male with Interstitial lung disease, pattern indeterminate for UIP. GGO present suggestive NSIP. ANA 1:160. SSA 71. Picture suggestive of Sjogren Associated ILD. Question for management.    PFT     No data to display            MDD discussion of CT scan    - Date or time period of scan:  HRCT 07/20/2022 CT chest WO: 09/19/2024  - Discussion synopsis:  Jul 20, 2024: Subpleral retictulation, Broncheilectasis, . traction bronehcitacsis, Bilateral, Diffuse. No CCG. Some GGO.   RELATIVE to 2023 : it is now more organized but distribution is same. REC: A) rheum consult; B) if rheum negative, IPAF diagnosis; C) recommend immune modulatior +/- antifibrotic D) look for progression with PFT; E) ILD clinic eval; F) No BIOPSY  - What is the final conclusion per 2018 ATS/Fleischner Criteria - A) rheum consult; B) if rheum negative, IPAF diagnosis  - Concordance with official report: Not commented upon   Pathology discussion of biopsy: n/a    MDD Impression/Recs: REC: A) rheum consult; B) if rheum negative, IPAF diagnosis; C) recommend immune modulatior +/- antifibrotic D) look for progression with PFT; E) ILD clinic eval; F) No BIOPSY    Time Spent in preparation and discussion:  > 30 min    SIGNATURE   Dr. Dorethia Cave, M.D., F.C.C.P,  Pulmonary and Critical Care Medicine Staff Physician, [facility] Center Director - Interstitial Lung Disease  Program  Pulmonary Fibrosis [facility]  at [facility] [place], [place], [address]  Pager: [phone], If no answer or between  15:00h - 7:00h: call [phone] Telephone: [phone]  3:36 PM 11/10/2024 ...................................................................................................................SABRA References: Diagnosis of Hypersensitivity Pneumonitis in Adults. An Official ATS/JRS/ALAT Clinical Practice Guideline. Ragu G et al, Am J Respir Crit Care Med. 2020 Aug 1;202(3):e36-e69.       Diagnosis of Idiopathic Pulmonary Fibrosis. An Official ATS/ERS/JRS/ALAT Clinical Practice Guideline. Raghu G et al, Am J Respir Crit Care Med. 2018 Sep 1;198(5):e44-e68.   IPF Suspected   Histopath ology Pattern      UIP  Probable UIP  Indeterminate for  UIP  Alternative  diagnosis    UIP  IPF  IPF  IPF  Non-IPF dx   HRCT   Probabe UIP  IPF  IPF  IPF (Likely)**  Non-IPF dx  Pattern  Indeterminate for UIP  IPF  IPF (Likely)**  Indeterminate  for IPF**  Non-IPF dx    Alternative diagnosis  IPF (Likely)**/ non-IPF dx  Non-IPF dx  Non-IPF dx  Non-IPF dx     Idiopathic pulmonary fibrosis diagnosis based upon HRCT and Biopsy paterns.  ** IPF is the likely diagnosis when any of following features are present:  Moderate-to-severe traction bronchiectasis/bronchiolectasis (defined as mild traction bronchiectasis/bronchiolectasis in four or more lobes including the lingual as a lobe, or moderate to severe traction bronchiectasis in two or more lobes) in a man over age 47 years or in a woman over age 9 years Extensive (>30%) reticulation on HRCT and an age >70 years  Increased neutrophils and/or absence of lymphocytosis in BAL fluid  Multidisciplinary discussion reaches a confident diagnosis of IPF.   **Indeterminate for IPF  Without an adequate biopsy is unlikely to be IPF  With an adequate biopsy may be reclassified to a more specific diagnosis after multidisciplinary discussion  and/or additional consultation.   dx = diagnosis; HRCT = high-resolution computed tomography; IPF = idiopathic pulmonary fibrosis; UIP = usual interstitial pneumonia.

## 2024-11-12 ENCOUNTER — Encounter

## 2024-11-17 ENCOUNTER — Encounter

## 2024-11-18 ENCOUNTER — Ambulatory Visit: Attending: Cardiovascular Disease

## 2024-11-18 DIAGNOSIS — I639 Cerebral infarction, unspecified: Secondary | ICD-10-CM | POA: Diagnosis not present

## 2024-11-18 DIAGNOSIS — Z7901 Long term (current) use of anticoagulants: Secondary | ICD-10-CM | POA: Diagnosis not present

## 2024-11-18 LAB — POCT INR: INR: 1.3 — AB (ref 2.0–3.0)

## 2024-11-18 NOTE — Patient Instructions (Signed)
 Take another 1.5 tablets today only then continue 1 tablet Daily, except 1.5 tablets every Wednesday.  Recheck INR in 2 weeks.  [phone]

## 2024-11-19 ENCOUNTER — Encounter

## 2024-11-24 ENCOUNTER — Encounter

## 2024-11-24 NOTE — Progress Notes (Signed)
"   INR 1.3 Please see anticoagulation encounter Take another 1.5 tablets today only then continue 1 tablet Daily, except 1.5 tablets every Wednesday.  Recheck INR in 2 weeks.  [phone]  "

## 2024-12-01 ENCOUNTER — Encounter

## 2024-12-02 ENCOUNTER — Ambulatory Visit: Attending: Cardiovascular Disease

## 2024-12-02 DIAGNOSIS — I639 Cerebral infarction, unspecified: Secondary | ICD-10-CM

## 2024-12-02 DIAGNOSIS — Z7901 Long term (current) use of anticoagulants: Secondary | ICD-10-CM

## 2024-12-02 LAB — POCT INR: INR: 1 — AB (ref 2.0–3.0)

## 2024-12-02 NOTE — Patient Instructions (Signed)
 Take another 0.5 tablet today only then Increase to 1 tablet Daily, except 1.5 tablets every Monday, Wednesday and Friday.  Recheck INR in 3 weeks.  [phone]

## 2024-12-08 ENCOUNTER — Encounter

## 2024-12-10 ENCOUNTER — Encounter

## 2024-12-15 ENCOUNTER — Encounter

## 2024-12-17 ENCOUNTER — Encounter

## 2024-12-22 ENCOUNTER — Encounter

## 2024-12-23 ENCOUNTER — Ambulatory Visit: Attending: Cardiovascular Disease

## 2024-12-23 DIAGNOSIS — Z7901 Long term (current) use of anticoagulants: Secondary | ICD-10-CM

## 2024-12-23 DIAGNOSIS — I639 Cerebral infarction, unspecified: Secondary | ICD-10-CM

## 2024-12-23 LAB — POCT INR: INR: 1.1 — AB (ref 2.0–3.0)

## 2024-12-23 NOTE — Patient Instructions (Signed)
 Take another 0.5 tablet today only and 2 tablets tomorrow morning then continue 1 tablet Daily, except 1.5 tablets every Monday, Wednesday and Friday.  Recheck INR in 2 weeks.  [phone]

## 2024-12-24 ENCOUNTER — Encounter

## 2024-12-25 ENCOUNTER — Ambulatory Visit: Admitting: Pulmonary Disease

## 2024-12-25 ENCOUNTER — Telehealth: Payer: Self-pay

## 2024-12-25 DIAGNOSIS — J849 Interstitial pulmonary disease, unspecified: Secondary | ICD-10-CM

## 2024-12-25 NOTE — Telephone Encounter (Signed)
 Overdue for CellCept  labs - last labs from November.   Messaging patient to revisit lab monitoring.

## 2024-12-25 NOTE — Telephone Encounter (Signed)
 Patient is overdue for CellCept  labs. Will message patient to schedule lab visit.

## 2024-12-28 ENCOUNTER — Ambulatory Visit: Admitting: Pulmonary Disease

## 2024-12-28 NOTE — Telephone Encounter (Signed)
 Called patient to discuss - spouse manages medications, he was unable to verbalize what he is taking.   Spouse reports things have been chaotic as she was recently diagnosed with cancer, and she is primarily who manages her care and her spouse's care.   He has been taking CellCept  1000mg  BID and Bactrim  DS three times weekly. He has been off prednisone  after a taper.   Will repeat labs on 01/06/25 at time of next INR check. PharmD to message RN to coordinate.   Spouse reports they are due for OV with Dr. Malka -will have front desk schedule.

## 2024-12-29 ENCOUNTER — Encounter

## 2024-12-31 ENCOUNTER — Encounter

## 2025-01-01 NOTE — Telephone Encounter (Signed)
 Lab orders entered for [facility]. Called patient/spouse to inform them they will need to go to freestanding [facility] location. They plan to go 2/4. NFN.

## 2025-01-05 ENCOUNTER — Encounter

## 2025-01-06 ENCOUNTER — Ambulatory Visit

## 2025-01-06 ENCOUNTER — Other Ambulatory Visit: Payer: Self-pay

## 2025-01-06 DIAGNOSIS — Z7901 Long term (current) use of anticoagulants: Secondary | ICD-10-CM

## 2025-01-06 DIAGNOSIS — I639 Cerebral infarction, unspecified: Secondary | ICD-10-CM

## 2025-01-06 DIAGNOSIS — Z5181 Encounter for therapeutic drug level monitoring: Secondary | ICD-10-CM

## 2025-01-06 DIAGNOSIS — J849 Interstitial pulmonary disease, unspecified: Secondary | ICD-10-CM

## 2025-01-06 LAB — POCT INR: INR: 4 — AB (ref 2.0–3.0)

## 2025-01-06 NOTE — Patient Instructions (Signed)
"   Hold tomorrow only then continue 1 tablet Daily, except 1.5 tablets every Monday, Wednesday and Friday.  Recheck INR in 3 weeks.  [phone]    "

## 2025-01-07 ENCOUNTER — Encounter

## 2025-01-12 ENCOUNTER — Encounter

## 2025-01-14 ENCOUNTER — Encounter

## 2025-01-19 ENCOUNTER — Encounter

## 2025-01-20 ENCOUNTER — Ambulatory Visit: Admitting: Pulmonary Disease

## 2025-01-21 ENCOUNTER — Encounter

## 2025-01-26 ENCOUNTER — Encounter

## 2025-01-27 ENCOUNTER — Ambulatory Visit

## 2025-01-28 ENCOUNTER — Encounter
